# Patient Record
Sex: Female | Born: 1955
Health system: Southern US, Community
[De-identification: ages and names within clinical notes are randomized; demographics above are authoritative.]

## PROBLEM LIST (undated history)

## (undated) DIAGNOSIS — I10 Essential (primary) hypertension: Secondary | ICD-10-CM

## (undated) DIAGNOSIS — B1920 Unspecified viral hepatitis C without hepatic coma: Secondary | ICD-10-CM

## (undated) DIAGNOSIS — E119 Type 2 diabetes mellitus without complications: Secondary | ICD-10-CM

## (undated) DIAGNOSIS — I639 Cerebral infarction, unspecified: Secondary | ICD-10-CM

## (undated) DIAGNOSIS — J189 Pneumonia, unspecified organism: Secondary | ICD-10-CM

## (undated) HISTORY — PX: NECK SURGERY: SHX720

## (undated) HISTORY — PX: SHOULDER SURGERY: SHX246

---

## 1997-11-09 ENCOUNTER — Ambulatory Visit (HOSPITAL_BASED_OUTPATIENT_CLINIC_OR_DEPARTMENT_OTHER): Admission: RE | Admit: 1997-11-09 | Discharge: 1997-11-09 | Payer: Self-pay | Admitting: Orthopedic Surgery

## 1998-02-12 ENCOUNTER — Encounter: Payer: Self-pay | Admitting: Orthopedic Surgery

## 1998-02-12 ENCOUNTER — Ambulatory Visit (HOSPITAL_COMMUNITY): Admission: RE | Admit: 1998-02-12 | Discharge: 1998-02-12 | Payer: Self-pay | Admitting: Orthopedic Surgery

## 1998-02-22 ENCOUNTER — Ambulatory Visit (HOSPITAL_COMMUNITY): Admission: RE | Admit: 1998-02-22 | Discharge: 1998-02-22 | Payer: Self-pay | Admitting: Orthopedic Surgery

## 1998-02-22 ENCOUNTER — Encounter: Payer: Self-pay | Admitting: Orthopedic Surgery

## 1998-06-21 ENCOUNTER — Encounter: Payer: Self-pay | Admitting: Neurosurgery

## 1998-06-23 ENCOUNTER — Inpatient Hospital Stay (HOSPITAL_COMMUNITY): Admission: RE | Admit: 1998-06-23 | Discharge: 1998-06-25 | Payer: Self-pay | Admitting: Neurosurgery

## 1998-06-23 ENCOUNTER — Encounter: Payer: Self-pay | Admitting: Neurosurgery

## 1998-12-13 ENCOUNTER — Encounter: Admission: RE | Admit: 1998-12-13 | Discharge: 1998-12-13 | Payer: Self-pay | Admitting: Family Medicine

## 1999-01-24 ENCOUNTER — Encounter: Admission: RE | Admit: 1999-01-24 | Discharge: 1999-01-24 | Payer: Self-pay | Admitting: Family Medicine

## 1999-02-03 ENCOUNTER — Ambulatory Visit (HOSPITAL_COMMUNITY): Admission: RE | Admit: 1999-02-03 | Discharge: 1999-02-03 | Payer: Self-pay | Admitting: Orthopaedic Surgery

## 1999-02-03 ENCOUNTER — Encounter: Payer: Self-pay | Admitting: Orthopaedic Surgery

## 2002-02-20 ENCOUNTER — Inpatient Hospital Stay (HOSPITAL_COMMUNITY): Admission: AD | Admit: 2002-02-20 | Discharge: 2002-02-20 | Payer: Self-pay | Admitting: *Deleted

## 2002-02-23 ENCOUNTER — Inpatient Hospital Stay (HOSPITAL_COMMUNITY): Admission: AD | Admit: 2002-02-23 | Discharge: 2002-02-23 | Payer: Self-pay | Admitting: *Deleted

## 2003-03-13 ENCOUNTER — Encounter: Payer: Self-pay | Admitting: *Deleted

## 2003-03-13 ENCOUNTER — Emergency Department (HOSPITAL_COMMUNITY): Admission: AD | Admit: 2003-03-13 | Discharge: 2003-03-13 | Payer: Self-pay | Admitting: Family Medicine

## 2003-03-15 ENCOUNTER — Emergency Department (HOSPITAL_COMMUNITY): Admission: AD | Admit: 2003-03-15 | Discharge: 2003-03-15 | Payer: Self-pay | Admitting: Family Medicine

## 2005-01-17 ENCOUNTER — Emergency Department (HOSPITAL_COMMUNITY): Admission: EM | Admit: 2005-01-17 | Discharge: 2005-01-18 | Payer: Self-pay | Admitting: Emergency Medicine

## 2005-08-23 ENCOUNTER — Emergency Department (HOSPITAL_COMMUNITY): Admission: EM | Admit: 2005-08-23 | Discharge: 2005-08-23 | Payer: Self-pay | Admitting: Emergency Medicine

## 2006-06-23 ENCOUNTER — Emergency Department (HOSPITAL_COMMUNITY): Admission: EM | Admit: 2006-06-23 | Discharge: 2006-06-23 | Payer: Self-pay | Admitting: Emergency Medicine

## 2007-10-26 ENCOUNTER — Emergency Department (HOSPITAL_COMMUNITY): Admission: EM | Admit: 2007-10-26 | Discharge: 2007-10-26 | Payer: Self-pay | Admitting: Urology

## 2008-01-21 ENCOUNTER — Emergency Department (HOSPITAL_COMMUNITY): Admission: EM | Admit: 2008-01-21 | Discharge: 2008-01-21 | Payer: Self-pay | Admitting: Emergency Medicine

## 2008-08-12 ENCOUNTER — Inpatient Hospital Stay (HOSPITAL_COMMUNITY): Admission: EM | Admit: 2008-08-12 | Discharge: 2008-08-14 | Payer: Self-pay | Admitting: Emergency Medicine

## 2009-10-23 ENCOUNTER — Emergency Department (HOSPITAL_COMMUNITY): Admission: EM | Admit: 2009-10-23 | Discharge: 2009-10-23 | Payer: Self-pay | Admitting: Emergency Medicine

## 2010-04-22 ENCOUNTER — Encounter: Payer: Self-pay | Admitting: Sports Medicine

## 2010-07-10 LAB — URINALYSIS, ROUTINE W REFLEX MICROSCOPIC
Bilirubin Urine: NEGATIVE
Hgb urine dipstick: NEGATIVE
Ketones, ur: NEGATIVE mg/dL
Protein, ur: NEGATIVE mg/dL
Specific Gravity, Urine: 1.004 — ABNORMAL LOW (ref 1.005–1.030)
Urobilinogen, UA: 1 mg/dL (ref 0.0–1.0)

## 2010-07-10 LAB — RAPID URINE DRUG SCREEN, HOSP PERFORMED
Amphetamines: NOT DETECTED
Benzodiazepines: NOT DETECTED

## 2010-07-10 LAB — COMPREHENSIVE METABOLIC PANEL
AST: 100 U/L — ABNORMAL HIGH (ref 0–37)
Alkaline Phosphatase: 77 U/L (ref 39–117)
Calcium: 9.3 mg/dL (ref 8.4–10.5)
Total Bilirubin: 0.9 mg/dL (ref 0.3–1.2)
Total Protein: 6.7 g/dL (ref 6.0–8.3)

## 2010-07-10 LAB — BASIC METABOLIC PANEL
CO2: 29 mEq/L (ref 19–32)
Calcium: 8.8 mg/dL (ref 8.4–10.5)
Chloride: 109 mEq/L (ref 96–112)
Creatinine, Ser: 0.7 mg/dL (ref 0.4–1.2)
GFR calc Af Amer: >60 mL/min (ref >60)
GFR calc non Af Amer: >60 mL/min (ref >60)
Glucose, Bld: 122 mg/dL — ABNORMAL HIGH (ref 70–99)

## 2010-07-10 LAB — HEPATIC FUNCTION PANEL
Alkaline Phosphatase: 71 U/L (ref 39–117)
Indirect Bilirubin: 0.7 mg/dL (ref 0.3–0.9)
Total Protein: 6.6 g/dL (ref 6.0–8.3)

## 2010-07-10 LAB — CBC
HCT: 42.9 % (ref 36.0–46.0)
Platelets: 143 10*3/uL — ABNORMAL LOW (ref 150–400)
WBC: 7.4 10*3/uL (ref 4.0–10.5)

## 2010-07-10 LAB — POCT I-STAT, CHEM 8
Chloride: 102 mEq/L (ref 96–112)
Hemoglobin: 15.6 g/dL — ABNORMAL HIGH (ref 12.0–15.0)
Sodium: 138 mEq/L (ref 135–145)
TCO2: 28 mmol/L (ref 0–100)

## 2010-07-10 LAB — APTT: aPTT: 31 seconds (ref 24–37)

## 2010-07-10 LAB — URINE CULTURE

## 2010-07-10 LAB — CARDIAC PANEL(CRET KIN+CKTOT+MB+TROPI)
CK, MB: 0.6 ng/mL (ref 0.3–4.0)
Relative Index: INVALID (ref 0.0–2.5)
Total CK: 50 U/L (ref 7–177)
Total CK: 56 U/L (ref 7–177)
Troponin I: <0.01 ng/mL (ref 0.00–0.06)
Troponin I: <0.01 ng/mL (ref 0.00–0.06)

## 2010-07-10 LAB — POCT CARDIAC MARKERS
CKMB, poc: <1 ng/mL — ABNORMAL LOW (ref 1.0–8.0)
Myoglobin, poc: 49.6 ng/mL (ref 12–200)

## 2010-07-10 LAB — DIFFERENTIAL
Basophils Absolute: 0 10*3/uL (ref 0.0–0.1)
Basophils Relative: 0 % (ref 0–1)
Eosinophils Relative: 1 % (ref 0–5)
Lymphs Abs: 2.8 10*3/uL (ref 0.7–4.0)
Neutro Abs: 4 10*3/uL (ref 1.7–7.7)

## 2010-07-10 LAB — D-DIMER, QUANTITATIVE: D-Dimer, Quant: <0.22 ug/mL-FEU (ref 0.00–0.48)

## 2010-07-10 LAB — TSH: TSH: 0.328 u[IU]/mL — ABNORMAL LOW (ref 0.350–4.500)

## 2010-07-10 LAB — PROTIME-INR: Prothrombin Time: 13.5 seconds (ref 11.6–15.2)

## 2010-07-10 LAB — LIPID PANEL: VLDL: 11 mg/dL (ref 0–40)

## 2010-08-14 NOTE — Discharge Summary (Signed)
[name], [name]            ACCOUNT NO.:  [id number]   MEDICAL RECORD NO.:  [id number]          PATIENT TYPE:  INP   LOCATION:  1409                         FACILITY:  [facility]   PHYSICIAN:  Peggye Pitt, M.D. DATE OF BIRTH:  [birth year]   DATE OF ADMISSION:  08/12/2008  DATE OF DISCHARGE:  08/14/2008                               DISCHARGE SUMMARY   DISCHARGE DIAGNOSES:  1. Atypical chest pain, ruled out for acute coronary syndrome, likely      secondary to cocaine.  2. Cocaine abuse.  3. Alcohol abuse.  4. Hypertension.  5. Tobacco abuse.   DISCHARGE MEDICATIONS:  Include  1. Lopressor 12.5 mg twice daily.  2. Aspirin 81 mg daily.  3. Thiamine 100 mg daily.  4. Multivitamin 1 tablet daily.   DISPOSITION AND FOLLOW-UP:  [name] is discharged home today in  stable condition.  She has not experienced any chest pain since  admission.  She currently does not have a primary care physician.  She  has been provided with the number for [facility] for her to make an  appointment to follow up with.   CONSULTATION THIS HOSPITALIZATION:  None.   Images and procedures performed during this hospitalization include a  chest x-ray on Aug 12, 2008 that showed no acute cardiopulmonary  abnormality.   HISTORY AND PHYSICAL EXAM:  For full details please refer to dictation  by Dr. Orvan Falconer on Aug 12, 2008, but in brief [name] is a pleasant  55 year old African American woman with a history of polysubstance abuse  including cocaine who presented to the hospital with complaints of  central chest pain radiating to her right and left arms associated with  nausea and diaphoresis that she has been having for the past 3 weeks.  She usually uses cocaine about 3 times a week and she last used the day  prior to admission.  For this reason she was brought to the emergency  room at the insistence of her sister for further evaluation and  management.   HOSPITAL COURSE BY PROBLEM:  1.  Atypical chest pain.  She has now ruled out for an acute coronary      syndrome with 3 sets of negative cardiac enzymes.  She has also had      an EKG that shows no acute ST or T-wave changes.  I believe at this      time that her chest pain is most likely secondary to her cocaine      use.  If the chest pain recurs in the absence of cocaine at that      point, I would consider a stress test given that she does have some      coronary artery disease risk factors including obesity,      hypertension, and tobacco abuse.  2. Hypertension.  She has maintained good blood pressure while in the      hospital. It seems upon admission that she was on      hydrochlorothiazide at a very high dose of 50 mg.  However, because      of her chest pain  I have decided to start her on metoprolol      instead.  Hence I have discontinued her hydrochlorothiazide.  She      has maintained excellent blood pressure control while in the      hospital.  3. Polysubstance abuse including alcohol, cocaine and tobacco.  She      has been placed on an alcohol withdrawal protocol while in the      hospital. She was given a multivitamin and thiamine as well.  I      have provided her with aggressive smoking and cocaine cessation      counseling. However, she refuses assistance at this time.  Of note      her UDS was positive for cocaine upon admission.  4. Rest of chronic medical issues have not been a problem this      hospitalization.   VITAL SIGNS UPON DISCHARGE:  Blood pressure 113/76, heart rate 65,  respirations 20, O2 sats 96% on room air with a temperature of 98.   LABS ON DAY OF DISCHARGE:  Sodium 141, potassium 3.7, chloride 109,  bicarb 29, BUN 11, creatinine 0.7 with a glucose of 122.  WBCs 5.6,  hemoglobin 13.4, platelet count of 135. A TSH that is low normal at  0.35.  A fasting lipid panel that shows a total cholesterol of 123,  triglycerides of 56 and HDL of 39 and an LDL of 73.      Peggye Pitt, M.D.  Electronically Signed     EH/MEDQ  D:  08/14/2008  T:  08/14/2008  Job:  [id number]

## 2010-08-14 NOTE — H&P (Signed)
[name], [name] NO.:  [id number]   MEDICAL RECORD NO.:  [id number]          PATIENT TYPE:  INP   LOCATION:                               FACILITY:  [facility]   PHYSICIAN:  Vania Rea, M.D. DATE OF BIRTH:  04-Jan-1956   DATE OF ADMISSION:  08/12/2008  DATE OF DISCHARGE:                              HISTORY & PHYSICAL   PRIMARY CARE PHYSICIAN:  Unassigned.   CHIEF COMPLAINT:  Chest pain.   HISTORY OF PRESENT ILLNESS:  This is a 55 year old African American lady  with no past history of cardiac disease, but does have a 37-year history  of cocaine abuse, ongoing who reports that she has been having frequent  episodes of central chest pain radiating to the left arm associated with  nausea and diaphoresis for the past 3 weeks.  The patient says she  thinks it is precipitated by stress.  She has recently had to attend to  her sister who was dying of cancer as well as giving orders to have her  sister's life terminated.  She has also been dealing with the funeral  arrangements.  She says the pain is not aggravated by exertion, but she  does not exert herself very much.  She does not do much walking because  of osteoarthritis in the knees.  The pain seems to be aggravated by  sitting up.  It is not related to meals.  It is relieved by resting or  lying still.   The patient uses cocaine frequently, but is unable to quantify.  The  last use was yesterday.  She is very vague about her alcohol use  history.  Says she only drinks one beer occasionally and liquor very  occasionally.  She was last in rehab about 12 years ago and  last  abstained when she was incarcerated about 1 year ago.  She smokes 2  packs per day for about 37 years.  She denies history of cardiac disease  or hyperlipidemia.  She does have a history of hypertension, but has not  been seeing any physician.  Sometimes takes her sister's lisinopril,  self-medicates herself with HCTZ, but  cannot  remember how she gets it.   She denies any fever, cough or cold.  She denies any nausea or vomiting.  She is constipated.  She has been having lower abdominal pain and  episodic epigastric pain.   PAST MEDICAL HISTORY:  1. Hypertension.  2. Tobacco abuse.  3. Cocaine abuse.  4. Questionable alcohol abuse.  This is recorded in her medical      records, but she denies it.  5. History of bilateral shoulder surgery 12 years ago for dislocated      shoulder.  6. History of neck surgery 11 years ago for a prolapsed disk.   MEDICATIONS:  HCTZ 50 mg daily.   ALLERGIES:  IBUPROFEN causes hives.   SOCIAL HISTORY:  As noted above.   REVIEW OF SYSTEMS:  On a 10-point review of systems other than noted  above unremarkable.   PHYSICAL EXAMINATION:  GENERAL:  Middle-aged [place] American lady  reclining in  the stretcher.  At various times, she appears very drowsy.  Although cooperative with the history, is very vague and has a very  strange affect in giving the history.  VITAL SIGNS:  Temperature is 98.3, pulse 89, respirations 18, blood  pressure 109/74. She is saturating at 97% on room air.  She denies any  pain currently.  She has received intravenous morphine while in the  emergency room.  HEENT:  Her pupils are round, equal and reactive.  Mucous membranes are  pink and anicteric.  She is mildly dehydrated.  NECK:  No cervical lymphadenopathy or thyromegaly.  No carotid bruit.  CHEST:  Clear to auscultation bilaterally.  CARDIOVASCULAR:  Regular rhythm without murmur.  ABDOMEN:  Obese and soft.  She does have epigastric tenderness and right  lower quadrant tenderness, but there is no rebound.  There are no  masses.  EXTREMITIES:  Without edema.  She has 2+ pulses bilaterally.  CENTRAL NERVOUS SYSTEM:  Cranial nerves II-XII are grossly intact and  she has no focal neurologic deficit.   LABORATORY DATA:  CBC is remarkable only for platelet count 143, MCV of  103.6, hemoglobin is  14.5.  CBC is otherwise unremarkable.  Serum  chemistry is significant for serum potassium of 3.2, it is otherwise  unremarkable.  Her coags are normal with a PT of 13.5.  Urinalysis is  unremarkable.  Urine drug screen is pending.   DIAGNOSTICS:  1. Two-view chest x-ray shows no acute cardiopulmonary disease.  2. X-rays of the cervical spine shows anterior fusion of C4-C7,      anterior osteophytes of C2-C3 and C3-C4, but no evidence of acute      abnormality.   ASSESSMENT:  1. Atypical chest pain.  2. Polysubstance abuse.  3. History of hypertension.  4. Hypokalemia possibly related to HCTZ use.   PLAN:  We will admit this lady on a chest pain rule out protocol.  She  denies alcohol abuse and indicates very clearly that she is not  interested in tobacco or cocaine cessation.  If the patient's cardiac  enzymes are negative, she may benefit from cardiac stress testing.  Other plans as per orders.      Vania Rea, M.D.  Electronically Signed     LC/MEDQ  D:  08/12/2008  T:  08/13/2008  Job:  [id number]

## 2010-12-31 LAB — POCT I-STAT, CHEM 8
Calcium, Ion: 1.17
Chloride: 104
Glucose, Bld: 135 — ABNORMAL HIGH
HCT: 49 — ABNORMAL HIGH
TCO2: 25

## 2010-12-31 LAB — URINALYSIS, ROUTINE W REFLEX MICROSCOPIC
Bilirubin Urine: NEGATIVE
Ketones, ur: NEGATIVE
Nitrite: NEGATIVE
Protein, ur: NEGATIVE

## 2012-08-27 ENCOUNTER — Encounter (HOSPITAL_COMMUNITY): Payer: Self-pay | Admitting: Emergency Medicine

## 2012-08-27 ENCOUNTER — Emergency Department (INDEPENDENT_AMBULATORY_CARE_PROVIDER_SITE_OTHER)
Admission: EM | Admit: 2012-08-27 | Discharge: 2012-08-27 | Disposition: A | Discharge status: Home / Self Care | Payer: Self-pay | Source: Home / Self Care | Attending: Family Medicine | Admitting: Family Medicine

## 2012-08-27 DIAGNOSIS — J40 Bronchitis, not specified as acute or chronic: Secondary | ICD-10-CM

## 2012-08-27 HISTORY — DX: Unspecified viral hepatitis C without hepatic coma: B19.20

## 2012-08-27 HISTORY — DX: Pneumonia, unspecified organism: J18.9

## 2012-08-27 MED ORDER — CETIRIZINE HCL 10 MG PO TABS: 10.0000 mg | ORAL_TABLET | Freq: Every evening | ORAL | Status: DC | PRN

## 2012-08-27 MED ORDER — PREDNISONE 20 MG PO TABS: ORAL_TABLET | ORAL | Status: DC

## 2012-08-27 MED ORDER — AZITHROMYCIN 250 MG PO TABS: ORAL_TABLET | ORAL | Status: DC

## 2012-08-27 MED ORDER — ALBUTEROL SULFATE HFA 108 (90 BASE) MCG/ACT IN AERS: 1.0000 | INHALATION_SPRAY | Freq: Four times a day (QID) | RESPIRATORY_TRACT | Status: DC | PRN

## 2012-08-27 MED ORDER — GUAIFENESIN-CODEINE 100-10 MG/5ML PO SYRP: 5.0000 mL | ORAL_SOLUTION | Freq: Three times a day (TID) | ORAL | Status: DC | PRN

## 2012-08-27 NOTE — ED Provider Notes (Signed)
History     CSN: [id number]  Arrival date & time 08/27/12  1031   First MD Initiated Contact with Patient 08/27/12 1119      Chief Complaint  Patient presents with  . Nasal Congestion    (Consider location/radiation/quality/duration/timing/severity/associated sxs/prior treatment) HPI Comments: 57 year old smoker female with history of hepatitis C. Here complaining of  nasal congestion, bilateral ear pressure, productive cough of a yellow sputum and intermittent wheezing for 8 days. Has had temperatures of 98 and 99 Fahrenheit denies chills. No chest pain or abdominal pain. No nausea vomiting or diarrhea.   Past Medical History  Diagnosis Date  . Hepatitis C   . Pneumonia     Past Surgical History  Procedure Laterality Date  . Neck surgery    . Shoulder surgery Bilateral     No family history on file.  History  Substance Use Topics  . Smoking status: Current Every Day Smoker  . Smokeless tobacco: Not on file  . Alcohol Use: Yes    OB History   Grav Para Term Preterm Abortions TAB SAB Ect Mult Living                  Review of Systems  Constitutional: Negative for fever, chills, diaphoresis, appetite change and fatigue.  HENT: Positive for ear pain, congestion and rhinorrhea. Negative for hearing loss, sore throat, trouble swallowing, neck stiffness and ear discharge.   Respiratory: Positive for cough. Negative for shortness of breath and wheezing.   Cardiovascular: Negative for chest pain.  Gastrointestinal: Negative for vomiting, abdominal pain and diarrhea.  Skin: Negative for rash.  Neurological: Negative for dizziness and headaches.    Allergies  Ibuprofen  Home Medications   Current Outpatient Rx  Name  Route  Sig  Dispense  Refill  . albuterol (PROVENTIL HFA;VENTOLIN HFA) 108 (90 BASE) MCG/ACT inhaler   Inhalation   Inhale 1-2 puffs into the lungs every 6 (six) hours as needed for wheezing.   1 Inhaler   0   . azithromycin (ZITHROMAX) 250 MG  tablet      2 tabs by mouth on day one then one tablet daily for 4 more days.   6 tablet   0   . guaiFENesin-codeine (ROBITUSSIN AC) 100-10 MG/5ML syrup   Oral   Take 5 mLs by mouth 3 (three) times daily as needed for cough.   120 mL   0   . predniSONE (DELTASONE) 20 MG tablet      2 tabs by mouth daily for 5 days   10 tablet   0     BP 154/96  Pulse 66  Temp(Src) 98.1 F (36.7 C) (Oral)  Resp 20  SpO2 96%  Physical Exam  Nursing note and vitals reviewed. Constitutional: She is oriented to person, place, and time. She appears well-developed and well-nourished. No distress.  Nasal Congestion with erythema and swelling of nasal turbinates, clear rhinorrhea. pharyngeal erythema no exudates. No uvula deviation. No trismus. TM's impress retracted bilaterally no redness swelling or bulging  HENT:  Head: Normocephalic.  Right Ear: External ear normal.  Left Ear: External ear normal.  Eyes: Conjunctivae are normal. Right eye exhibits no discharge. Left eye exhibits no discharge.  Neck: Neck supple. No thyromegaly present.  Cardiovascular: Normal rate, regular rhythm and normal heart sounds.   Pulmonary/Chest: Effort normal and breath sounds normal. No respiratory distress. She has no wheezes. She has no rales. She exhibits no tenderness.  Bronchitic cough. Sporadic expiratory rhonchi bilaterally  Lymphadenopathy:    She has no cervical adenopathy.  Neurological: She is alert and oriented to person, place, and time.  Skin: No rash noted. She is not diaphoretic.    ED Course  Procedures (including critical care time)  Labs Reviewed - No data to display No results found.   1. Bronchitis       MDM  Prescribed albuterol, azithromycin, guaifenesin/codeine and prednisone. Supportive care and red flags that should prompt her return to medical attention discussed with patient and provided in writing.        Sharin Grave, MD 08/29/12 1610

## 2012-08-27 NOTE — ED Notes (Signed)
C/o fever, congestion, and cough.  Onset last week.

## 2012-08-27 NOTE — ED Notes (Signed)
Dr Alfonse Ras  Handed instructions to this nurse-patient not in treatment room  Patient was trying to catch ride with someone else in lobby, but did not tell this nurse she was leaving now

## 2012-09-21 ENCOUNTER — Emergency Department (HOSPITAL_COMMUNITY)
Admission: EM | Admit: 2012-09-21 | Discharge: 2012-09-21 | Disposition: A | Discharge status: Home / Self Care | Payer: Self-pay | Attending: Emergency Medicine | Admitting: Emergency Medicine

## 2012-09-21 ENCOUNTER — Emergency Department (HOSPITAL_COMMUNITY): Payer: Self-pay

## 2012-09-21 ENCOUNTER — Encounter (HOSPITAL_COMMUNITY): Payer: Self-pay | Admitting: Emergency Medicine

## 2012-09-21 DIAGNOSIS — Z888 Allergy status to other drugs, medicaments and biological substances status: Secondary | ICD-10-CM | POA: Insufficient documentation

## 2012-09-21 DIAGNOSIS — I10 Essential (primary) hypertension: Secondary | ICD-10-CM | POA: Insufficient documentation

## 2012-09-21 DIAGNOSIS — F17200 Nicotine dependence, unspecified, uncomplicated: Secondary | ICD-10-CM | POA: Insufficient documentation

## 2012-09-21 DIAGNOSIS — J410 Simple chronic bronchitis: Secondary | ICD-10-CM | POA: Insufficient documentation

## 2012-09-21 DIAGNOSIS — B1920 Unspecified viral hepatitis C without hepatic coma: Secondary | ICD-10-CM | POA: Insufficient documentation

## 2012-09-21 DIAGNOSIS — Z8701 Personal history of pneumonia (recurrent): Secondary | ICD-10-CM | POA: Insufficient documentation

## 2012-09-21 DIAGNOSIS — F1410 Cocaine abuse, uncomplicated: Secondary | ICD-10-CM | POA: Insufficient documentation

## 2012-09-21 DIAGNOSIS — R079 Chest pain, unspecified: Secondary | ICD-10-CM | POA: Insufficient documentation

## 2012-09-21 DIAGNOSIS — R110 Nausea: Secondary | ICD-10-CM | POA: Insufficient documentation

## 2012-09-21 DIAGNOSIS — R61 Generalized hyperhidrosis: Secondary | ICD-10-CM | POA: Insufficient documentation

## 2012-09-21 DIAGNOSIS — R509 Fever, unspecified: Secondary | ICD-10-CM | POA: Insufficient documentation

## 2012-09-21 DIAGNOSIS — R0602 Shortness of breath: Secondary | ICD-10-CM | POA: Insufficient documentation

## 2012-09-21 DIAGNOSIS — F1490 Cocaine use, unspecified, uncomplicated: Secondary | ICD-10-CM

## 2012-09-21 HISTORY — DX: Essential (primary) hypertension: I10

## 2012-09-21 LAB — CBC
HCT: 42.9 % (ref 36.0–46.0)
MCHC: 35.4 g/dL (ref 30.0–36.0)
MCV: 100.5 fL — ABNORMAL HIGH (ref 78.0–100.0)
Platelets: 103 10*3/uL — ABNORMAL LOW (ref 150–400)
RDW: 12.2 % (ref 11.5–15.5)
WBC: 5.3 10*3/uL (ref 4.0–10.5)

## 2012-09-21 LAB — BASIC METABOLIC PANEL
BUN: 9 mg/dL (ref 6–23)
Chloride: 102 mEq/L (ref 96–112)
Creatinine, Ser: 0.68 mg/dL (ref 0.50–1.10)
GFR calc Af Amer: >90 mL/min (ref >90)
GFR calc non Af Amer: >90 mL/min (ref >90)
Potassium: 3.6 mEq/L (ref 3.5–5.1)

## 2012-09-21 LAB — POCT I-STAT TROPONIN I: Troponin i, poc: 0.01 ng/mL (ref 0.00–0.08)

## 2012-09-21 MED ORDER — MORPHINE SULFATE 4 MG/ML IJ SOLN
4.0000 mg | Freq: Once | INTRAMUSCULAR | Status: AC
  Administered 2012-09-21: 4 mg via INTRAMUSCULAR
  Filled 2012-09-21: qty 1

## 2012-09-21 MED ORDER — DIPHENHYDRAMINE HCL 50 MG/ML IJ SOLN
25.0000 mg | Freq: Once | INTRAMUSCULAR | Status: AC
  Administered 2012-09-21: 25 mg via INTRAVENOUS
  Filled 2012-09-21: qty 1

## 2012-09-21 MED ORDER — IOHEXOL 350 MG/ML SOLN
100.0000 mL | Freq: Once | INTRAVENOUS | Status: AC | PRN
  Administered 2012-09-21: 80 mL via INTRAVENOUS

## 2012-09-21 MED ORDER — MORPHINE SULFATE 4 MG/ML IJ SOLN: 4.0000 mg | Freq: Once | INTRAMUSCULAR | Status: DC

## 2012-09-21 MED ORDER — ONDANSETRON HCL 4 MG/2ML IJ SOLN: 4.0000 mg | Freq: Once | INTRAMUSCULAR | Status: DC

## 2012-09-21 MED ORDER — ONDANSETRON 4 MG PO TBDP
4.0000 mg | ORAL_TABLET | Freq: Once | ORAL | Status: AC
  Administered 2012-09-21: 4 mg via ORAL
  Filled 2012-09-21: qty 1

## 2012-09-21 MED ORDER — ACETAMINOPHEN 500 MG PO TABS: 500.0000 mg | ORAL_TABLET | Freq: Four times a day (QID) | ORAL | Status: DC | PRN

## 2012-09-21 NOTE — ED Notes (Signed)
Patient oximetry dropped to 86% while ambulating

## 2012-09-21 NOTE — ED Notes (Signed)
Pt reports mouth swelling and redness after CT dye. PA made aware.

## 2012-09-21 NOTE — ED Provider Notes (Signed)
History    CSN: [id number] Arrival date & time 09/21/12  1404  First MD Initiated Contact with Patient 09/21/12 1559     Chief Complaint  Patient presents with  . Chest Pain   (Consider location/radiation/quality/duration/timing/severity/associated sxs/prior Treatment) HPI Pt is a 57yo female presenting with 3 day hx of midsternal chest pain that is constant, "pressure like," 5/10, radiating down left arm.  Pt also reports mild SOB, nausea, fever of 101 Saturday and diaphoresis "dripping wet" with chronic "smokers cough."  Pt has not tried anything for pain.  Admits to using cocaine 2 days ago but states she does not recall having chest pain in past with cocaine use.  Pt also reports hx of Hep C, "kidney problems" born with 3 kidneys, HTN, and admittance to hospital 4 years ago for hypokalemia.  Denies vomiting, recent travel, recent surgery, leg pain, or sick contacts.  Past Medical History  Diagnosis Date  . Hepatitis C   . Pneumonia   . Hypertension    Past Surgical History  Procedure Laterality Date  . Neck surgery    . Shoulder surgery Bilateral    History reviewed. No pertinent family history. History  Substance Use Topics  . Smoking status: Current Every Day Smoker  . Smokeless tobacco: Not on file  . Alcohol Use: Yes   OB History   Grav Para Term Preterm Abortions TAB SAB Ect Mult Living                 Review of Systems  Constitutional: Positive for fever and diaphoresis. Negative for chills and fatigue.  Respiratory: Positive for cough and shortness of breath.   Cardiovascular: Positive for chest pain. Negative for palpitations and leg swelling.  Gastrointestinal: Positive for nausea. Negative for vomiting, abdominal pain and diarrhea.  All other systems reviewed and are negative.    Allergies  Ibuprofen  Home Medications   Current Outpatient Rx  Name  Route  Sig  Dispense  Refill  . albuterol (PROVENTIL HFA;VENTOLIN HFA) 108 (90 BASE) MCG/ACT  inhaler   Inhalation   Inhale 2 puffs into the lungs every 6 (six) hours as needed for wheezing.         . cetirizine (ZYRTEC) 10 MG tablet   Oral   Take 10 mg by mouth at bedtime as needed for allergies or rhinitis.         . metoprolol tartrate (LOPRESSOR) 25 MG tablet   Oral   Take 25 mg by mouth 2 (two) times daily.         Marland Kitchen acetaminophen (TYLENOL) 500 MG tablet   Oral   Take 1 tablet (500 mg total) by mouth every 6 (six) hours as needed for pain.   30 tablet   0   . azithromycin (ZITHROMAX) 250 MG tablet   Oral   Take 250 mg by mouth daily. 2 tabs by mouth on day one then one tablet daily for 4 more days.         . predniSONE (DELTASONE) 20 MG tablet   Oral   Take 20 mg by mouth daily. 2 tabs by mouth daily for 5 days          BP 155/103  Pulse 64  Temp(Src) 97.7 F (36.5 C) (Oral)  Resp 13  SpO2 96% Physical Exam  Nursing note and vitals reviewed. Constitutional: She appears well-developed and well-nourished. No distress.  Pt lying comfortably in exam bed, no acute distress. Able to speak in full sentences.  HENT:  Head: Normocephalic and atraumatic.  Eyes: Conjunctivae are normal. No scleral icterus.  Neck: Normal range of motion. Neck supple.  Cardiovascular: Normal rate, regular rhythm and normal heart sounds.   Pulmonary/Chest: Effort normal and breath sounds normal. No respiratory distress. She has no wheezes. She has no rales. She exhibits no tenderness.  Abdominal: Soft. Bowel sounds are normal. She exhibits no distension and no mass. There is no tenderness. There is no rebound and no guarding.  Musculoskeletal: Normal range of motion.  Neurological: She is alert.  Skin: Skin is warm and dry. She is not diaphoretic.    ED Course  Procedures (including critical care time) Labs Reviewed  CBC - Abnormal; Notable for the following:    Hemoglobin 15.2 (*)    MCV 100.5 (*)    MCH 35.6 (*)    Platelets 103 (*)    All other components within  normal limits  BASIC METABOLIC PANEL - Abnormal; Notable for the following:    Glucose, Bld 153 (*)    All other components within normal limits  POCT I-STAT TROPONIN I   Dg Chest 2 View  09/21/2012   *RADIOLOGY REPORT*  Clinical Data: Chest pain.  History of hypertension, smoking, bronchitis.  CHEST - 2 VIEW  Comparison: 08/12/2008  Findings: Cardiomediastinal silhouette is within normal limits. The lungs are free of focal consolidations and pleural effusions. No pulmonary edema.  Previous anterior cervical spine fusion.  IMPRESSION: No evidence for acute cardiopulmonary abnormality.   Original Report Authenticated By: Norva Pavlov, M.D.   Ct Angio Chest Pe W/cm &/or Wo Cm  09/21/2012   *RADIOLOGY REPORT*  Clinical Data: Chest pain and short of breath.  Hypoxia.  CT ANGIOGRAPHY CHEST  Technique:  Multidetector CT imaging of the chest using the standard protocol during bolus administration of intravenous contrast. Multiplanar reconstructed images including MIPs were obtained and reviewed to evaluate the vascular anatomy.  Contrast: 80mL OMNIPAQUE IOHEXOL 350 MG/ML SOLN  Comparison: 09/21/2012  Findings: Negative for pulmonary embolism.  No acute abnormality of the aorta or heart.  Mild dependent atelectasis in the lung bases.  Negative for pneumonia.  No mass lesion or adenopathy is present in the chest. Upper abdomen is negative.  IMPRESSION: Negative for pulmonary embolism.  No acute abnormality.   Original Report Authenticated By: Janeece Riggers, M.D.    Date: 09/21/2012  Rate: 97  Rhythm: normal sinus rhythm  QRS Axis: normal  Intervals: normal  ST/T Wave abnormalities: nonspecific ST/T changes  Conduction Disutrbances:none  Narrative Interpretation:   Old EKG Reviewed: none available    1. Chest pain   2. Cocaine use     MDM  Pt with recent cocaine use presenting with 3 day hx of midsternal CP.  DDX: STEMI/ACS, PE (low risk, PERC neg), cocaine related CP, pneumonia, pericarditis,  GERD, MSK, endocarditis.    4:22 PM  Pt is hypertensive upon arrival at 155/103.  Will tx with morphine and zophran at this time.  Obtaining labs: CBC, BMP, istat troponin, and imaging: CXR and EKG.  Will reassess.   CBC: unremarkable BMP: unremarkable ECG: unremarkable, no STEMI istat troponin: neg CXR: no evidence of acute cardiopulmonary abnormality   4:22 PMPt states pain has improved.  Vitals are unremarkable except O2 stats of 93%.  Will ambulate pt.    4:22 PM Ambulatory O2: down to 86%.  Will skip d-dimer and go straight to CT angio.  Pt is PERC neg however increased concern for PE after negative cardiac workup,  pt is still symptomatic.    CT angio: Neg for PE. No acute abnormality 4:22 PM Repeat ambulatory O2 is 94%, will discharge pt home to f/u with PCP.    F/u with PCP, [place] [facility] info provided. Return precautions given. Pt verbalized understanding and agreement with tx plan. Vitals: unremarkable. Discharged in stable condition.    Discussed pt with Dr. Effie Shy during ED encounter.     Junius Finner, PA-C 09/22/12 1622

## 2012-09-21 NOTE — ED Notes (Signed)
Pt c/o mid sternal CP x 3 days; pt admits to cocaine use last night; pt sts some SOB and cough

## 2012-09-23 NOTE — ED Provider Notes (Signed)
Medical screening examination/treatment/procedure(s) were performed by non-physician practitioner and as supervising physician I was immediately available for consultation/collaboration.  Flint Melter, MD 09/23/12 [phone]

## 2013-02-02 ENCOUNTER — Ambulatory Visit: Payer: Medicaid Other | Attending: Internal Medicine

## 2013-03-02 ENCOUNTER — Ambulatory Visit: Payer: Self-pay | Admitting: Internal Medicine

## 2013-03-08 ENCOUNTER — Encounter (HOSPITAL_COMMUNITY): Payer: Self-pay | Admitting: Emergency Medicine

## 2013-03-08 ENCOUNTER — Emergency Department (HOSPITAL_COMMUNITY): Payer: No Typology Code available for payment source

## 2013-03-08 ENCOUNTER — Emergency Department (HOSPITAL_COMMUNITY)
Admission: EM | Admit: 2013-03-08 | Discharge: 2013-03-08 | Disposition: A | Discharge status: Home / Self Care | Payer: No Typology Code available for payment source | Attending: Emergency Medicine | Admitting: Emergency Medicine

## 2013-03-08 DIAGNOSIS — Y9289 Other specified places as the place of occurrence of the external cause: Secondary | ICD-10-CM | POA: Insufficient documentation

## 2013-03-08 DIAGNOSIS — M79671 Pain in right foot: Secondary | ICD-10-CM

## 2013-03-08 DIAGNOSIS — R42 Dizziness and giddiness: Secondary | ICD-10-CM | POA: Insufficient documentation

## 2013-03-08 DIAGNOSIS — Y939 Activity, unspecified: Secondary | ICD-10-CM | POA: Insufficient documentation

## 2013-03-08 DIAGNOSIS — Z8701 Personal history of pneumonia (recurrent): Secondary | ICD-10-CM | POA: Insufficient documentation

## 2013-03-08 DIAGNOSIS — F17200 Nicotine dependence, unspecified, uncomplicated: Secondary | ICD-10-CM | POA: Insufficient documentation

## 2013-03-08 DIAGNOSIS — R142 Eructation: Secondary | ICD-10-CM | POA: Insufficient documentation

## 2013-03-08 DIAGNOSIS — I10 Essential (primary) hypertension: Secondary | ICD-10-CM | POA: Insufficient documentation

## 2013-03-08 DIAGNOSIS — R269 Unspecified abnormalities of gait and mobility: Secondary | ICD-10-CM | POA: Insufficient documentation

## 2013-03-08 DIAGNOSIS — R141 Gas pain: Secondary | ICD-10-CM | POA: Insufficient documentation

## 2013-03-08 DIAGNOSIS — Z79899 Other long term (current) drug therapy: Secondary | ICD-10-CM | POA: Insufficient documentation

## 2013-03-08 DIAGNOSIS — S8990XA Unspecified injury of unspecified lower leg, initial encounter: Secondary | ICD-10-CM | POA: Insufficient documentation

## 2013-03-08 DIAGNOSIS — Z8619 Personal history of other infectious and parasitic diseases: Secondary | ICD-10-CM | POA: Insufficient documentation

## 2013-03-08 DIAGNOSIS — W108XXA Fall (on) (from) other stairs and steps, initial encounter: Secondary | ICD-10-CM | POA: Insufficient documentation

## 2013-03-08 MED ORDER — MORPHINE SULFATE 2 MG/ML IJ SOLN
2.0000 mg | Freq: Once | INTRAMUSCULAR | Status: AC
Start: 2013-03-08 — End: 2013-03-08
  Administered 2013-03-08: 2 mg via INTRAVENOUS
  Filled 2013-03-08: qty 1

## 2013-03-08 MED ORDER — HYDROCODONE-ACETAMINOPHEN 5-325 MG PO TABS: 1.0000 | ORAL_TABLET | Freq: Four times a day (QID) | ORAL | Status: DC | PRN

## 2013-03-08 MED ORDER — ONDANSETRON HCL 4 MG/2ML IJ SOLN
4.0000 mg | Freq: Once | INTRAMUSCULAR | Status: AC
  Administered 2013-03-08: 4 mg via INTRAVENOUS
  Filled 2013-03-08: qty 2

## 2013-03-08 NOTE — ED Provider Notes (Signed)
CSN: [id number]     Arrival date & time 03/08/13  1100 History   First MD Initiated Contact with Patient 03/08/13 1125     Chief Complaint  Patient presents with  . Foot Pain    bilateral   HPI 57 year old woman with PMH HTN, hepatitis C who presents with bilateral foot pain.   Patient states she went to a music performance last night, drank 2 alcoholic beverages. She went to a neighbor's house after and fell down the stairs on her way home.  She did not hit her head or lose consciousness.  However, she came down wrong on her feet and has been in excruciating pain in bilateral feet since. States she went to sleep last night but awoke this morning crying in pain. She is unable to walk or put any weight on her feet and states that the tops of both feet are very tender to touch.  She notes one scrape on the top of her left foot but no bruising.  Patient also thinks that her blood pressure has been high for the past week or so and as a result she has felt dizzy intermittently.  She already has an appointment set up in [facility] later this month.   On ROS, reports headache this morning. No chest pain, shortness of breath, abdominal pain (she does note some distention after her drinking last night), n/v/d.      Past Medical History  Diagnosis Date  . Hepatitis C   . Pneumonia   . Hypertension    Past Surgical History  Procedure Laterality Date  . Neck surgery    . Shoulder surgery Bilateral    No family history on file. History  Substance Use Topics  . Smoking status: Current Every Day Smoker  . Smokeless tobacco: Not on file  . Alcohol Use: Yes   OB History   Grav Para Term Preterm Abortions TAB SAB Ect Mult Living                 Review of Systems  Constitutional: Negative for fever and chills.  HENT: Negative for congestion.   Respiratory: Negative for cough and shortness of breath.   Cardiovascular: Negative for chest pain, palpitations and leg swelling.  Gastrointestinal:  Positive for abdominal distention. Negative for nausea, vomiting, abdominal pain, diarrhea, constipation and blood in stool.  Genitourinary: Negative for dysuria.  Musculoskeletal: Positive for gait problem and joint swelling. Negative for back pain.  Neurological: Positive for dizziness. Negative for syncope and light-headedness.    Allergies  Ibuprofen  Home Medications   Current Outpatient Rx  Name  Route  Sig  Dispense  Refill  . albuterol (PROVENTIL HFA;VENTOLIN HFA) 108 (90 BASE) MCG/ACT inhaler   Inhalation   Inhale 2 puffs into the lungs every 6 (six) hours as needed for wheezing.         Marland Kitchen FLUoxetine (PROZAC) 20 MG capsule   Oral   Take 20 mg by mouth daily.         . metoprolol tartrate (LOPRESSOR) 25 MG tablet   Oral   Take 12.5 mg by mouth 2 (two) times daily.          Marland Kitchen zolpidem (AMBIEN) 5 MG tablet   Oral   Take 5 mg by mouth at bedtime as needed for sleep.         Marland Kitchen HYDROcodone-acetaminophen (NORCO) 5-325 MG per tablet   Oral   Take 1 tablet by mouth every 6 (six)  hours as needed for moderate pain.   10 tablet   0    BP 121/81  Pulse 78  Temp(Src) 98.2 F (36.8 C) (Oral)  Resp 20  SpO2 96% Physical Exam  Constitutional: She is oriented to person, place, and time. She appears well-developed and well-nourished. No distress.  HENT:  Head: Normocephalic and atraumatic.  Eyes: Conjunctivae and EOM are normal. Pupils are equal, round, and reactive to light.  Neck: Normal range of motion. Neck supple.  Cardiovascular: Normal rate, regular rhythm and normal heart sounds.   Pulmonary/Chest: Effort normal and breath sounds normal.  Abdominal: Soft. Bowel sounds are normal. She exhibits distension. There is no tenderness. There is no rebound and no guarding.  Musculoskeletal:  Neurovascularly intact.   Mild abrasion to lateral dorsal surface of left foot.  Tenderness to very light palpation of dorsal surfaces of bilateral feet.  No ecchymoses, no  erythema.   Neurological: She is alert and oriented to person, place, and time. No cranial nerve deficit.  Skin: Skin is warm and dry.    ED Course  Procedures (including critical care time) Labs Review Labs Reviewed - No data to display Imaging Review Dg Foot 2 Views Left  03/08/2013   CLINICAL DATA:  Foot pain  EXAM: LEFT FOOT - 2 VIEW  COMPARISON:  None.  FINDINGS: There is no evidence of fracture or dislocation. There is no evidence of arthropathy or other focal bone abnormality. Soft tissues are unremarkable.  IMPRESSION: Negative.   Electronically Signed   By: Malachy Moan M.D.   On: 03/08/2013 12:34   Dg Foot 2 Views Right  03/08/2013   CLINICAL DATA:  Larey Seat, pain on bottom of feet  EXAM: RIGHT FOOT - 2 VIEW  COMPARISON:  Concurrently obtained radiographs of the contralateral foot  FINDINGS: There is no evidence of fracture or dislocation. There is no evidence of arthropathy or other focal bone abnormality. Soft tissues are unremarkable. Incidental note is made of then os tibialis externum.  IMPRESSION: Negative.   Electronically Signed   By: Malachy Moan M.D.   On: 03/08/2013 12:37    EKG Interpretation   None       MDM  Bilateral foot pain after fall- tenderness to touch of dorsal aspects of feet bilaterally. Bilateral 2 view foot xrays negative.  Morphine 2 mg IV, Zofran given for pain.  She has an appt in [facility] later this month.  Discharged home with rx for Norco 5-325 mg PO prn pain #10.  She was encouraged to follow-up with PCP.   Rocco Serene, MD 03/08/13 [phone]

## 2013-03-08 NOTE — ED Notes (Signed)
Patient states bilateral foot pain after falling down stairs last night, patient with abrasion to top of left foot.  Patient also with pain in right thumb, +PMS in all extremities

## 2013-03-10 NOTE — ED Provider Notes (Signed)
I saw and evaluated the patient, reviewed the resident's note and I agree with the findings and plan.  EKG Interpretation   None       Xrays negative. Bilat foot pain.  Normal ankle exam.  VSS.  Pt cannot take NSAIDs. Plan for RICE and short course of pain Rx.  ER precautions given.    Darlys Gales, MD 03/10/13 0630

## 2013-03-30 ENCOUNTER — Ambulatory Visit: Payer: No Typology Code available for payment source | Attending: Internal Medicine | Admitting: Internal Medicine

## 2013-03-30 ENCOUNTER — Encounter: Payer: Self-pay | Admitting: Internal Medicine

## 2013-03-30 VITALS — BP 160/105 | HR 78 | Temp 98.9°F | Resp 14 | Ht 65.0 in | Wt 206.0 lb

## 2013-03-30 DIAGNOSIS — B1920 Unspecified viral hepatitis C without hepatic coma: Secondary | ICD-10-CM | POA: Insufficient documentation

## 2013-03-30 DIAGNOSIS — I10 Essential (primary) hypertension: Secondary | ICD-10-CM | POA: Insufficient documentation

## 2013-03-30 DIAGNOSIS — E119 Type 2 diabetes mellitus without complications: Secondary | ICD-10-CM

## 2013-03-30 DIAGNOSIS — F3289 Other specified depressive episodes: Secondary | ICD-10-CM | POA: Insufficient documentation

## 2013-03-30 DIAGNOSIS — F329 Major depressive disorder, single episode, unspecified: Secondary | ICD-10-CM | POA: Insufficient documentation

## 2013-03-30 DIAGNOSIS — M79609 Pain in unspecified limb: Secondary | ICD-10-CM | POA: Insufficient documentation

## 2013-03-30 LAB — CBC WITH DIFFERENTIAL/PLATELET
Basophils Relative: 0 % (ref 0–1)
Eosinophils Absolute: 0.1 10*3/uL (ref 0.0–0.7)
Lymphs Abs: 3 10*3/uL (ref 0.7–4.0)
MCH: 35.4 pg — ABNORMAL HIGH (ref 26.0–34.0)
MCHC: 35.5 g/dL (ref 30.0–36.0)
Monocytes Relative: 7 % (ref 3–12)
Neutro Abs: 2.3 10*3/uL (ref 1.7–7.7)
Neutrophils Relative %: 40 % — ABNORMAL LOW (ref 43–77)
Platelets: 132 10*3/uL — ABNORMAL LOW (ref 150–400)
RBC: 4.27 MIL/uL (ref 3.87–5.11)

## 2013-03-30 LAB — LIPID PANEL
Cholesterol: 114 mg/dL (ref 0–200)
HDL: 39 mg/dL — ABNORMAL LOW (ref >39)
LDL Cholesterol: 58 mg/dL (ref 0–99)
Total CHOL/HDL Ratio: 2.9 Ratio
Triglycerides: 86 mg/dL (ref <150)
VLDL: 17 mg/dL (ref 0–40)

## 2013-03-30 LAB — COMPLETE METABOLIC PANEL WITH GFR
ALT: 113 U/L — ABNORMAL HIGH (ref 0–35)
Albumin: 3.8 g/dL (ref 3.5–5.2)
Alkaline Phosphatase: 83 U/L (ref 39–117)
BUN: 11 mg/dL (ref 6–23)
CO2: 27 mEq/L (ref 19–32)
GFR, Est African American: >89 mL/min
GFR, Est Non African American: >89 mL/min
Sodium: 137 mEq/L (ref 135–145)
Total Bilirubin: 0.9 mg/dL (ref 0.3–1.2)
Total Protein: 6.9 g/dL (ref 6.0–8.3)

## 2013-03-30 LAB — POCT GLYCOSYLATED HEMOGLOBIN (HGB A1C): Hemoglobin A1C: 5

## 2013-03-30 MED ORDER — FLUOXETINE HCL 20 MG PO CAPS: 20.0000 mg | ORAL_CAPSULE | Freq: Every day | ORAL | Status: DC

## 2013-03-30 MED ORDER — METOPROLOL TARTRATE 25 MG PO TABS: 12.5000 mg | ORAL_TABLET | Freq: Two times a day (BID) | ORAL | Status: DC

## 2013-03-30 MED ORDER — OFLOXACIN 0.3 % OP SOLN: 1.0000 [drp] | Freq: Four times a day (QID) | OPHTHALMIC | Status: DC

## 2013-03-30 MED ORDER — TERBINAFINE HCL 1 % EX CREA: 1.0000 "application " | TOPICAL_CREAM | Freq: Two times a day (BID) | CUTANEOUS | Status: DC

## 2013-03-30 MED ORDER — NICOTINE 14 MG/24HR TD PT24: 14.0000 mg | MEDICATED_PATCH | Freq: Every day | TRANSDERMAL | Status: DC

## 2013-03-30 MED ORDER — TRAMADOL HCL 50 MG PO TABS
50.0000 mg | ORAL_TABLET | Freq: Three times a day (TID) | ORAL | Status: DC | PRN
Start: 2013-03-30 — End: 2013-06-01

## 2013-03-30 MED ORDER — VITAMIN B-1 100 MG PO TABS: 100.0000 mg | ORAL_TABLET | Freq: Every day | ORAL | Status: DC

## 2013-03-30 NOTE — Progress Notes (Signed)
Patient ID: [name], female   DOB: 05-Sep-1955, 57 y.o.   MRN: [id number]   CC:  HPI 57 year old female with multiple medical complaints. The patient primarily complains of left foot pain. She had a fall and has had difficulty weightbearing. She also complains of having history of hypertension and was on metoprolol and lisinopril. She has not been taking these medications for several months. She also complains of having history of hepatitis C. She complains of drinking excessively  At least 4-5 beer a day. She states that she developed hepatitis C because of IV drug use She also complains of bruising easily She has a rash on her back at that she has been told was a fungal infection but that has improved since   She denies any hematochezia She states that her sister had breast cancer. She personally had a mammogram done last year which was negative  She states that she is up-to-date with her hepatitis immunization She does not want the flu vaccine  She has a family history of diabetes in her father    Allergies  Allergen Reactions  . Ibuprofen Hives and Other (See Comments)    Burning of the skin.   Past Medical History  Diagnosis Date  . Hepatitis C   . Pneumonia   . Hypertension    Current Outpatient Prescriptions on File Prior to Visit  Medication Sig Dispense Refill  . albuterol (PROVENTIL HFA;VENTOLIN HFA) 108 (90 BASE) MCG/ACT inhaler Inhale 2 puffs into the lungs every 6 (six) hours as needed for wheezing.      Marland Kitchen zolpidem (AMBIEN) 5 MG tablet Take 5 mg by mouth at bedtime as needed for sleep.      Marland Kitchen HYDROcodone-acetaminophen (NORCO) 5-325 MG per tablet Take 1 tablet by mouth every 6 (six) hours as needed for moderate pain.  10 tablet  0   No current facility-administered medications on file prior to visit.   Family History  Problem Relation Age of Onset  . Diabetes Father   . Diabetes Sister    History   Social History  . Marital Status: Single    Spouse  Name: N/A    Number of Children: N/A  . Years of Education: N/A   Occupational History  . Not on file.   Social History Main Topics  . Smoking status: Current Every Day Smoker  . Smokeless tobacco: Not on file  . Alcohol Use: Yes  . Drug Use: Yes    Special: Cocaine  . Sexual Activity: Not on file   Other Topics Concern  . Not on file   Social History Narrative  . No narrative on file    Review of Systems  Constitutional: Negative for fever, chills, diaphoresis, activity change, appetite change and fatigue.  HENT: Negative for ear pain, nosebleeds, congestion, facial swelling, rhinorrhea, neck pain, neck stiffness and ear discharge.   Eyes: Negative for pain, discharge, redness, itching and visual disturbance.  Respiratory: Negative for cough, choking, chest tightness, shortness of breath, wheezing and stridor.   Cardiovascular: Negative for chest pain, palpitations and leg swelling.  Gastrointestinal: Negative for abdominal distention.  Genitourinary: Negative for dysuria, urgency, frequency, hematuria, flank pain, decreased urine volume, difficulty urinating and dyspareunia.  Musculoskeletal: Negative for back pain, joint swelling, arthralgias and gait problem.  Neurological: Negative for dizziness, tremors, seizures, syncope, facial asymmetry, speech difficulty, weakness, light-headedness, numbness and headaches.  Hematological: Negative for adenopathy. Does not bruise/bleed easily.  Psychiatric/Behavioral: Negative for hallucinations, behavioral problems, confusion, dysphoric mood,  decreased concentration and agitation.    Objective:   Filed Vitals:   03/30/13 1551  BP: 160/105  Pulse: 78  Temp: 98.9 F (37.2 C)  Resp: 14    Physical Exam  Constitutional: Appears well-developed and well-nourished. No distress.  HENT: Normocephalic. External right and left ear normal. Oropharynx is clear and moist.  Eyes: Conjunctivae and EOM are normal. PERRLA, no scleral  icterus.  Neck: Normal ROM. Neck supple. No JVD. No tracheal deviation. No thyromegaly.  CVS: RRR, S1/S2 +, no murmurs, no gallops, no carotid bruit.  Pulmonary: Effort and breath sounds normal, no stridor, rhonchi, wheezes, rales.  Abdominal: Soft. BS +,  no distension, tenderness, rebound or guarding.  Musculoskeletal: Normal range of motion. No edema and no tenderness.  Lymphadenopathy: No lymphadenopathy noted, cervical, inguinal. Neuro: Alert. Normal reflexes, muscle tone coordination. No cranial nerve deficit. Skin: Skin is warm and dry. No rash noted. Not diaphoretic. No erythema. No pallor.  Psychiatric: Normal mood and affect. Behavior, judgment, thought content normal.   Lab Results  Component Value Date   WBC 5.3 09/21/2012   HGB 15.2* 09/21/2012   HCT 42.9 09/21/2012   MCV 100.5* 09/21/2012   PLT 103* 09/21/2012   Lab Results  Component Value Date   CREATININE 0.68 09/21/2012   BUN 9 09/21/2012   NA 137 09/21/2012   K 3.6 09/21/2012   CL 102 09/21/2012   CO2 27 09/21/2012    No results found for this basename: HGBA1C   Lipid Panel     Component Value Date/Time   CHOL  Value: 123        ATP III CLASSIFICATION:  <200     mg/dL   Desirable  536-644  mg/dL   Borderline High  >=034    mg/dL   High        7/42/5956 0309   TRIG 56 08/13/2008 0309   HDL 39* 08/13/2008 0309   CHOLHDL 3.2 08/13/2008 0309   VLDL 11 08/13/2008 0309   LDLCALC  Value: 73        Total Cholesterol/HDL:CHD Risk Coronary Heart Disease Risk Table                     Men   Women  1/2 Average Risk   3.4   3.3  Average Risk       5.0   4.4  2 X Average Risk   9.6   7.1  3 X Average Risk  23.4   11.0        Use the calculated Patient Ratio above and the CHD Risk Table to determine the patient's CHD Risk.        ATP III CLASSIFICATION (LDL):  <100     mg/dL   Optimal  387-564  mg/dL   Near or Above                    Optimal  130-159  mg/dL   Borderline  332-951  mg/dL   High  >884     mg/dL   Very High 1/66/0630 1601        Assessment and plan:   There are no active problems to display for this patient.  Hepatitis C We'll check CMP, CBC, and show the patient is not thrombocytopenic coagulopathic, HCV RNA   alcohol dependence Advised to quit drinking, start the patient on thiamine  Smoking cessation counseling was done and the patient has been provided with a nicotine patch  Hypertension start the patient on metoprolol  Depression Prozac refills, Denies any suicidal homicidal ideation  Onychomycosis Prescribe Lamisil Cream   left foot pain patient described tramadol, will obtain plain films  Followup in one month         The patient was given clear instructions to go to ER or return to medical center if symptoms don't improve, worsen or new problems develop. The patient verbalized understanding. The patient was told to call to get any lab results if not heard anything in the next week.

## 2013-03-30 NOTE — Addendum Note (Signed)
Addended by: Susie Cassette MD, Germain Osgood on: 03/30/2013 04:37 PM   Modules accepted: Orders

## 2013-03-30 NOTE — Addendum Note (Signed)
Addended by: Susie Cassette MD, Germain Osgood on: 03/30/2013 04:36 PM   Modules accepted: Orders

## 2013-03-30 NOTE — Progress Notes (Signed)
Pt is here to establish care. Complains of pain in Lt foot, bilateral hands, burning sensation in toes, Lt thumb locks up x5 months. No pain medication. Rash on buttocks x1 year; comes and goes. Rt knee pain; knee goes out and comes back. Joint pains, yellowish dark brown urine and yellowish stool. Could be blood in stool, excessive urine, excessive thirst, blurred vision, lightheadedness and dizziness. Pt has Hep C. Black toe nails.

## 2013-03-30 NOTE — Addendum Note (Signed)
Addended by: Lestine Mount on: 03/30/2013 04:35 PM   Modules accepted: Orders

## 2013-03-31 ENCOUNTER — Telehealth: Payer: Self-pay | Admitting: *Deleted

## 2013-03-31 LAB — VITAMIN D 25 HYDROXY (VIT D DEFICIENCY, FRACTURES): Vit D, 25-Hydroxy: 16 ng/mL — ABNORMAL LOW (ref 30–89)

## 2013-03-31 LAB — HCV RNA QUANT RFLX ULTRA OR GENOTYP: HCV Quantitative Log: 4.93 {Log} — ABNORMAL HIGH (ref <1.18)

## 2013-03-31 MED ORDER — VITAMIN D (ERGOCALCIFEROL) 1.25 MG (50000 UNIT) PO CAPS: 50000.0000 [IU] | ORAL_CAPSULE | ORAL | Status: DC

## 2013-03-31 NOTE — Telephone Encounter (Signed)
Message copied by Trinidi Toppins, [place] R on Wed Mar 31, 2013 10:06 AM ------      Message from: Susie Cassette MD, [facility]      Created: Wed Mar 31, 2013  9:50 AM       Please notify patient platelet count is low at 132. Liver function is mildly abnormal but this is not new for the patient. She has been referred to gastroenterology already. Vitamin D. level is low at 16. Please call in a prescription for vitamin D 50,000 units weekly, 10 tablets with 2 refills ------

## 2013-04-02 LAB — HEPATITIS C GENOTYPE

## 2013-05-03 ENCOUNTER — Ambulatory Visit: Payer: No Typology Code available for payment source | Attending: Internal Medicine | Admitting: Internal Medicine

## 2013-05-03 ENCOUNTER — Encounter: Payer: Self-pay | Admitting: Internal Medicine

## 2013-05-03 VITALS — BP 140/94 | HR 65 | Temp 98.0°F | Resp 16 | Ht 65.0 in | Wt 202.0 lb

## 2013-05-03 DIAGNOSIS — B1710 Acute hepatitis C without hepatic coma: Secondary | ICD-10-CM | POA: Insufficient documentation

## 2013-05-03 DIAGNOSIS — M129 Arthropathy, unspecified: Secondary | ICD-10-CM

## 2013-05-03 DIAGNOSIS — J111 Influenza due to unidentified influenza virus with other respiratory manifestations: Secondary | ICD-10-CM | POA: Insufficient documentation

## 2013-05-03 DIAGNOSIS — B1920 Unspecified viral hepatitis C without hepatic coma: Secondary | ICD-10-CM

## 2013-05-03 DIAGNOSIS — M1990 Unspecified osteoarthritis, unspecified site: Secondary | ICD-10-CM

## 2013-05-03 DIAGNOSIS — M1710 Unilateral primary osteoarthritis, unspecified knee: Secondary | ICD-10-CM | POA: Insufficient documentation

## 2013-05-03 DIAGNOSIS — IMO0002 Reserved for concepts with insufficient information to code with codable children: Secondary | ICD-10-CM

## 2013-05-03 MED ORDER — ACETAMINOPHEN-CODEINE #3 300-30 MG PO TABS
1.0000 | ORAL_TABLET | ORAL | Status: DC | PRN
Start: 2013-05-03 — End: 2013-08-21

## 2013-05-03 MED ORDER — GUAIFENESIN-DM 100-10 MG/5ML PO SYRP: 5.0000 mL | ORAL_SOLUTION | ORAL | Status: DC | PRN

## 2013-05-03 MED ORDER — OSELTAMIVIR PHOSPHATE 75 MG PO CAPS: 75.0000 mg | ORAL_CAPSULE | Freq: Two times a day (BID) | ORAL | Status: DC

## 2013-05-03 NOTE — Progress Notes (Signed)
Patient ID: [name], female   DOB: 08/01/1955, 58 y.o.   MRN: [id number] Patient Demographics  [name], is a 58 y.o. female  VHQ:[id number]  UXL:[id number]  DOB - 1955/05/28  No chief complaint on file.       Subjective:   [name] is a 58 y.o. female here today for a follow up visit. Patient is known to have hypertension and hepatitis C infection from previous IV drug use, major complaints today his cough productive of yellowish sputum, runny nose and general body aches. Her 74 year old daughter had influenza virus infection 3 days ago and since yesterday she is having the same symptoms as her. She denies fever, no headache. She has a chronic left knee pain that is acting up lately with worsening pain. No reinjury or recent trauma. No swelling of any joints. She also wants a referral to a specialist for her hepatitis C infection, she says she has been reading about available treatment and she is willing to be treated. Patient is not diabetic hemoglobin A1c was 5.0 recently Patient has No headache, No chest pain, No abdominal pain - No Nausea, No new weakness tingling or numbness, No Cough - SOB.  ALLERGIES: Allergies  Allergen Reactions  . Ibuprofen Hives and Other (See Comments)    Burning of the skin.    PAST MEDICAL HISTORY: Past Medical History  Diagnosis Date  . Hepatitis C   . Pneumonia   . Hypertension     MEDICATIONS AT HOME: Prior to Admission medications   Medication Sig Start Date End Date Taking? Authorizing Provider  albuterol (PROVENTIL HFA;VENTOLIN HFA) 108 (90 BASE) MCG/ACT inhaler Inhale 2 puffs into the lungs every 6 (six) hours as needed for wheezing. 08/27/12  Yes Adlih Moreno-Coll, MD  FLUoxetine (PROZAC) 20 MG capsule Take 1 capsule (20 mg total) by mouth daily. 03/30/13  Yes Reyne Dumas, MD  metoprolol tartrate (LOPRESSOR) 25 MG tablet Take 0.5 tablets (12.5 mg total) by mouth 2 (two) times daily. 03/30/13  Yes Reyne Dumas, MD   ofloxacin (OCUFLOX) 0.3 % ophthalmic solution Place 1 drop into both eyes 4 (four) times daily. 03/30/13  Yes Reyne Dumas, MD  terbinafine (LAMISIL AT) 1 % cream Apply 1 application topically 2 (two) times daily. 03/30/13  Yes Reyne Dumas, MD  traMADol (ULTRAM) 50 MG tablet Take 1 tablet (50 mg total) by mouth every 8 (eight) hours as needed. 03/30/13  Yes Reyne Dumas, MD  Vitamin D, Ergocalciferol, (DRISDOL) 50000 UNITS CAPS capsule Take 1 capsule (50,000 Units total) by mouth every 7 (seven) days. 03/31/13  Yes Reyne Dumas, MD  zolpidem (AMBIEN) 5 MG tablet Take 5 mg by mouth at bedtime as needed for sleep.   Yes Historical Provider, MD  acetaminophen-codeine (TYLENOL #3) 300-30 MG per tablet Take 1 tablet by mouth every 4 (four) hours as needed. 05/03/13   Angelica Chessman, MD  guaiFENesin-dextromethorphan (ROBITUSSIN DM) 100-10 MG/5ML syrup Take 5 mLs by mouth every 4 (four) hours as needed for cough. 05/03/13   Angelica Chessman, MD  HYDROcodone-acetaminophen (NORCO) 5-325 MG per tablet Take 1 tablet by mouth every 6 (six) hours as needed for moderate pain. 03/08/13   Ivin Poot, MD  nicotine (EQ NICOTINE) 14 mg/24hr patch Place 1 patch (14 mg total) onto the skin daily. 03/30/13   Reyne Dumas, MD  oseltamivir (TAMIFLU) 75 MG capsule Take 1 capsule (75 mg total) by mouth 2 (two) times daily. 05/03/13   Angelica Chessman, MD  thiamine (VITAMIN B-1) 100 MG  tablet Take 1 tablet (100 mg total) by mouth daily. 03/30/13   Reyne Dumas, MD     Objective:   Filed Vitals:   05/03/13 1649  BP: 140/94  Pulse: 65  Temp: 98 F (36.7 C)  TempSrc: Oral  Resp: 16  Height: 5\' 5"  (1.651 m)  Weight: 202 lb (91.627 kg)  SpO2: 95%    Exam General appearance : Awake, alert, not in any distress. Speech Clear. Not toxic looking HEENT: Atraumatic and Normocephalic, pupils equally reactive to light and accomodation Neck: supple, no JVD. No cervical lymphadenopathy.  Chest:Good air entry bilaterally,  no added sounds  CVS: S1 S2 regular, no murmurs.  Abdomen: Bowel sounds present, Non tender and not distended with no gaurding, rigidity or rebound. Extremities: B/L Lower Ext shows no edema, both legs are warm to touch Neurology: Awake alert, and oriented X 3, CN II-XII intact, Non focal Skin:No Rash Wounds:N/A   Data Review   CBC No results found for this basename: WBC, HGB, HCT, PLT, MCV, MCH, MCHC, RDW, NEUTRABS, LYMPHSABS, MONOABS, EOSABS, BASOSABS, BANDABS, BANDSABD,  in the last 168 hours  Chemistries   No results found for this basename: NA, K, CL, CO2, GLUCOSE, BUN, CREATININE, GFRCGP, CALCIUM, MG, AST, ALT, ALKPHOS, BILITOT,  in the last 168 hours ------------------------------------------------------------------------------------------------------------------ No results found for this basename: HGBA1C,  in the last 72 hours ------------------------------------------------------------------------------------------------------------------ No results found for this basename: CHOL, HDL, LDLCALC, TRIG, CHOLHDL, LDLDIRECT,  in the last 72 hours ------------------------------------------------------------------------------------------------------------------ No results found for this basename: TSH, T4TOTAL, FREET3, T3FREE, THYROIDAB,  in the last 72 hours ------------------------------------------------------------------------------------------------------------------ No results found for this basename: VITAMINB12, FOLATE, FERRITIN, TIBC, IRON, RETICCTPCT,  in the last 72 hours  Coagulation profile  No results found for this basename: INR, PROTIME,  in the last 168 hours    Assessment & Plan   1. Arthritis of the left knee  - acetaminophen-codeine (TYLENOL #3) 300-30 MG per tablet; Take 1 tablet by mouth every 4 (four) hours as needed.  Dispense: 60 tablet; Refill: 0  2. Influenza with respiratory manifestations  - oseltamivir (TAMIFLU) 75 MG capsule; Take 1 capsule (75 mg  total) by mouth 2 (two) times daily.  Dispense: 10 capsule; Refill: 0 - guaiFENesin-dextromethorphan (ROBITUSSIN DM) 100-10 MG/5ML syrup; Take 5 mLs by mouth every 4 (four) hours as needed for cough.  Dispense: 118 mL; Refill: 0  3. Hepatitis C infection  - Ambulatory referral to Infectious Disease  Patient was extensively counseled about smoking cessation Patient was counseled on nutrition and exercise  Follow up in 3 months or when necessary  The patient was given clear instructions to go to ER or return to medical center if symptoms don't improve, worsen or new problems develop. The patient verbalized understanding. The patient was told to call to get lab results if they haven't heard anything in the next week.    Angelica Chessman, MD, [place], [place], [place] [facility] [place], [place]   05/03/2013, 5:21 PM

## 2013-05-03 NOTE — Progress Notes (Signed)
Pt is here following up on her chronic knee pain. Pt is coughing up mucus frequently. Pt reports that she is having dry skin around her hands.

## 2013-05-03 NOTE — Patient Instructions (Signed)

## 2013-05-11 ENCOUNTER — Other Ambulatory Visit: Payer: Self-pay | Admitting: Internal Medicine

## 2013-05-11 MED ORDER — ALBUTEROL SULFATE HFA 108 (90 BASE) MCG/ACT IN AERS: 2.0000 | INHALATION_SPRAY | Freq: Four times a day (QID) | RESPIRATORY_TRACT | Status: DC | PRN

## 2013-06-01 ENCOUNTER — Encounter: Payer: Self-pay | Admitting: Internal Medicine

## 2013-06-01 ENCOUNTER — Ambulatory Visit (INDEPENDENT_AMBULATORY_CARE_PROVIDER_SITE_OTHER): Payer: No Typology Code available for payment source | Admitting: Internal Medicine

## 2013-06-01 VITALS — BP 149/92 | HR 68 | Temp 98.1°F | Ht 65.5 in | Wt 200.0 lb

## 2013-06-01 DIAGNOSIS — B1710 Acute hepatitis C without hepatic coma: Secondary | ICD-10-CM

## 2013-06-01 NOTE — Progress Notes (Signed)
Patient ID: [name], female   DOB: Jan 27, 1956, 58 y.o.   MRN: [id number] +[name] is a 58 y.o. female who presents for initial evaluation and management of a positive Hepatitis C antibody test.  Patient tested positive one year ago. Test was performed as part of an evaluation of known exposure to hepatitis C. Hepatitis C risk factors present are: IV drug abuse (details: shared needles with previous partner). Patient denies IV drug abuse. Patient has had other studies performed. Results: hepatitis C RNA by PCR, result: positive. Patient has not had prior treatment for Hepatitis C. Patient does not have a past history of liver disease. Patient does not have a family history of liver disease.   HPI:   Patient does have documented immunity to Hepatitis A. Patient does have documented immunity to Hepatitis B.     Review of Systems A comprehensive review of systems was negative.   Past Medical History  Diagnosis Date  . Hepatitis C   . Pneumonia   . Hypertension     History  Substance Use Topics  . Smoking status: Current Every Day Smoker -- 2.00 packs/day for 40 years    Types: Cigarettes  . Smokeless tobacco: Never Used  . Alcohol Use: 27.6 oz/week    42 Cans of beer, 4 Shots of liquor per week     Comment: "drinks all day long"    Family History  Problem Relation Age of Onset  . Diabetes Father   . Diabetes Sister       Objective:   Filed Vitals:   06/01/13 0953  BP: 149/92  Pulse: 68  Temp: 98.1 F (36.7 C)   in no apparent distress HEENT: anicteric Cor RRR clear Bowel sounds are normal, liver is not enlarged, spleen is not enlarged peripheral pulses normal, no pedal edema, no clubbing or cyanosis negative for - jaundice, spider hemangioma, telangiectasia, palmar erythema, ecchymosis and atrophy  Laboratory Genotype:  Lab Results  Component Value Date   HCVGENOTYPE 1a 03/30/2013   HCV viral load:  Lab Results  Component Value Date   WBC  5.7 03/30/2013   HGB 15.1* 03/30/2013   HCT 42.5 03/30/2013   MCV 99.5 03/30/2013   PLT 132* 03/30/2013    Lab Results  Component Value Date   CREATININE 0.74 03/30/2013   BUN 11 03/30/2013   NA 137 03/30/2013   K 3.8 03/30/2013   CL 106 03/30/2013   CO2 27 03/30/2013    Lab Results  Component Value Date   ALT 113* 03/30/2013   AST 86* 03/30/2013   ALKPHOS 83 03/30/2013   BILITOT 0.9 03/30/2013   INR 1.0 08/12/2008    HCV RNA 60630  Assessment: Hepatitis C genotype 1a  Plan: 1) Patient counseled extensively on limiting acetaminophen to no more than 2 grams daily, avoidance of alcohol.  She will need follow up with our substance abuse counselor. 2) Transmission discussed with patient including sexual transmission, sharing razors and toothbrush.   3) Will need referral to gastroenterology: has referral 4) Will need referral for substance abuse counseling: Y No current coverage at this time and not a candidate for treatment with ongoing substance abuse.  Needs counseling and to be drug and alcohol free for 6 months.  Will try to get medicaid in the meantime and will call back to schedule when she has that.

## 2013-06-09 ENCOUNTER — Ambulatory Visit: Payer: No Typology Code available for payment source | Admitting: *Deleted

## 2013-06-23 ENCOUNTER — Ambulatory Visit: Payer: No Typology Code available for payment source | Attending: Internal Medicine | Admitting: Internal Medicine

## 2013-06-23 MED ORDER — PREDNISONE 20 MG PO TABS: 40.0000 mg | ORAL_TABLET | Freq: Every day | ORAL | Status: DC

## 2013-06-23 MED ORDER — HYDROXYZINE HCL 10 MG PO TABS: 10.0000 mg | ORAL_TABLET | Freq: Three times a day (TID) | ORAL | Status: DC | PRN

## 2013-06-24 NOTE — Progress Notes (Signed)
Patient ID: [name], female   DOB: [birth year], 58 y.o.   MRN: [id number]   CC:  HPI: Patient seen and evaluated for a maculopapular rash and itching which is noted for about a couple weeks. The rash is present on her trunk back bilateral upper extremities and both legs. According to the patient she recently started taking Prozac and vitamin D. She's not sure if the onset of the rash correlates with her initiating these treatments. She denies any oral ulcerations.  Allergies  Allergen Reactions  . Ibuprofen Hives and Other (See Comments)    Burning of the skin.   Past Medical History  Diagnosis Date  . Hepatitis C   . Pneumonia   . Hypertension    Current Outpatient Prescriptions on File Prior to Visit  Medication Sig Dispense Refill  . acetaminophen-codeine (TYLENOL #3) 300-30 MG per tablet Take 1 tablet by mouth every 4 (four) hours as needed.  60 tablet  0  . albuterol (PROVENTIL HFA;VENTOLIN HFA) 108 (90 BASE) MCG/ACT inhaler Inhale 2 puffs into the lungs every 6 (six) hours as needed for wheezing.  3 Inhaler  3  . FLUoxetine (PROZAC) 20 MG capsule Take 1 capsule (20 mg total) by mouth daily.  30 capsule  3  . metoprolol tartrate (LOPRESSOR) 25 MG tablet Take 0.5 tablets (12.5 mg total) by mouth 2 (two) times daily.  60 tablet  5  . nicotine (EQ NICOTINE) 14 mg/24hr patch Place 1 patch (14 mg total) onto the skin daily.  28 patch  0  . ofloxacin (OCUFLOX) 0.3 % ophthalmic solution Place 1 drop into both eyes 4 (four) times daily.  10 mL  2  . terbinafine (LAMISIL AT) 1 % cream Apply 1 application topically 2 (two) times daily.  30 g  3  . thiamine (VITAMIN B-1) 100 MG tablet Take 1 tablet (100 mg total) by mouth daily.  30 tablet  3  . Vitamin D, Ergocalciferol, (DRISDOL) 50000 UNITS CAPS capsule Take 1 capsule (50,000 Units total) by mouth every 7 (seven) days.  10 capsule  2  . zolpidem (AMBIEN) 5 MG tablet Take 5 mg by mouth at bedtime as needed for sleep.       No  current facility-administered medications on file prior to visit.   Family History  Problem Relation Age of Onset  . Diabetes Father   . Diabetes Sister    History   Social History  . Marital Status: Single    Spouse Name: N/A    Number of Children: N/A  . Years of Education: N/A   Occupational History  . Not on file.   Social History Main Topics  . Smoking status: Current Every Day Smoker -- 2.00 packs/day for 40 years    Types: Cigarettes  . Smokeless tobacco: Never Used  . Alcohol Use: 27.6 oz/week    42 Cans of beer, 4 Shots of liquor per week     Comment: "drinks all day long"  . Drug Use: Yes    Special: Cocaine  . Sexual Activity: Not Currently   Other Topics Concern  . Not on file   Social History Narrative  . No narrative on file    Review of Systems  Constitutional: Negative for fever, chills, diaphoresis, activity change, appetite change and fatigue.  HENT: Negative for ear pain, nosebleeds, congestion, facial swelling, rhinorrhea, neck pain, neck stiffness and ear discharge.   Eyes: Negative for pain, discharge, redness, itching and visual disturbance.  Respiratory: Negative  for cough, choking, chest tightness, shortness of breath, wheezing and stridor.   Cardiovascular: Negative for chest pain, palpitations and leg swelling.  Gastrointestinal: Negative for abdominal distention.  Genitourinary: Negative for dysuria, urgency, frequency, hematuria, flank pain, decreased urine volume, difficulty urinating and dyspareunia.  Musculoskeletal: Negative for back pain, joint swelling, arthralgias and gait problem.  Neurological: Negative for dizziness, tremors, seizures, syncope, facial asymmetry, speech difficulty, weakness, light-headedness, numbness and headaches.  Hematological: Negative for adenopathy. Does not bruise/bleed easily.  Psychiatric/Behavioral: Negative for hallucinations, behavioral problems, confusion, dysphoric mood, decreased concentration and  agitation.    Objective:  There were no vitals filed for this visit.  Physical Exam  Constitutional: Appears well-developed and well-nourished. No distress.  HENT: Normocephalic. External right and left ear normal. Oropharynx is clear and moist.  Eyes: Conjunctivae and EOM are normal. PERRLA, no scleral icterus.  Neck: Normal ROM. Neck supple. No JVD. No tracheal deviation. No thyromegaly.  CVS: RRR, S1/S2 +, no murmurs, no gallops, no carotid bruit.  Pulmonary: Effort and breath sounds normal, no stridor, rhonchi, wheezes, rales.  Abdominal: Soft. BS +,  no distension, tenderness, rebound or guarding.  Musculoskeletal: Normal range of motion. No edema and no tenderness.  Lymphadenopathy: No lymphadenopathy noted, cervical, inguinal. Neuro: Alert. Normal reflexes, muscle tone coordination. No cranial nerve deficit. Skin: Diffuse maculopapular rash  Psychiatric: Normal mood and affect. Behavior, judgment, thought content normal.   Lab Results  Component Value Date   WBC 5.7 03/30/2013   HGB 15.1* 03/30/2013   HCT 42.5 03/30/2013   MCV 99.5 03/30/2013   PLT 132* 03/30/2013   Lab Results  Component Value Date   CREATININE 0.74 03/30/2013   BUN 11 03/30/2013   NA 137 03/30/2013   K 3.8 03/30/2013   CL 106 03/30/2013   CO2 27 03/30/2013    Lab Results  Component Value Date   HGBA1C 5.0 03/30/2013   Lipid Panel     Component Value Date/Time   CHOL 114 03/30/2013 1624   TRIG 86 03/30/2013 1624   HDL 39* 03/30/2013 1624   CHOLHDL 2.9 03/30/2013 1624   VLDL 17 03/30/2013 1624   LDLCALC 58 03/30/2013 1624       Assessment and plan:   Patient Active Problem List   Diagnosis Date Noted  . Arthritis 05/03/2013  . Influenza with respiratory manifestations 05/03/2013  . Acute hepatitis C virus infection without hepatic coma 05/03/2013   Diffuse maculopapular rash Patient prescribed prednisone for suspected drug rash Also started on hydroxyzine for itching If  no  improvement of the rash recurs, patient will be referred to dermatology     Followup as needed  The patient was given clear instructions to go to ER or return to medical center if symptoms don't improve, worsen or new problems develop. The patient verbalized understanding. The patient was told to call to get any lab results if not heard anything in the next week.

## 2013-08-03 ENCOUNTER — Ambulatory Visit: Payer: No Typology Code available for payment source | Attending: Internal Medicine | Admitting: Internal Medicine

## 2013-08-03 ENCOUNTER — Encounter: Payer: Self-pay | Admitting: Internal Medicine

## 2013-08-03 VITALS — BP 145/98 | HR 81 | Temp 98.3°F | Resp 16 | Ht 66.0 in | Wt 186.0 lb

## 2013-08-03 DIAGNOSIS — Z79899 Other long term (current) drug therapy: Secondary | ICD-10-CM | POA: Insufficient documentation

## 2013-08-03 DIAGNOSIS — B182 Chronic viral hepatitis C: Secondary | ICD-10-CM | POA: Insufficient documentation

## 2013-08-03 DIAGNOSIS — R112 Nausea with vomiting, unspecified: Secondary | ICD-10-CM | POA: Insufficient documentation

## 2013-08-03 DIAGNOSIS — R1013 Epigastric pain: Secondary | ICD-10-CM | POA: Insufficient documentation

## 2013-08-03 DIAGNOSIS — R634 Abnormal weight loss: Secondary | ICD-10-CM | POA: Insufficient documentation

## 2013-08-03 DIAGNOSIS — F329 Major depressive disorder, single episode, unspecified: Secondary | ICD-10-CM | POA: Insufficient documentation

## 2013-08-03 DIAGNOSIS — F17200 Nicotine dependence, unspecified, uncomplicated: Secondary | ICD-10-CM | POA: Insufficient documentation

## 2013-08-03 DIAGNOSIS — B1920 Unspecified viral hepatitis C without hepatic coma: Secondary | ICD-10-CM

## 2013-08-03 DIAGNOSIS — I10 Essential (primary) hypertension: Secondary | ICD-10-CM | POA: Insufficient documentation

## 2013-08-03 DIAGNOSIS — F3289 Other specified depressive episodes: Secondary | ICD-10-CM | POA: Insufficient documentation

## 2013-08-03 DIAGNOSIS — F1010 Alcohol abuse, uncomplicated: Secondary | ICD-10-CM | POA: Insufficient documentation

## 2013-08-03 DIAGNOSIS — Z8701 Personal history of pneumonia (recurrent): Secondary | ICD-10-CM | POA: Insufficient documentation

## 2013-08-03 LAB — LIPASE: Lipase: 74 U/L (ref 0–75)

## 2013-08-03 LAB — AMYLASE: Amylase: 40 U/L (ref 0–105)

## 2013-08-03 MED ORDER — ONDANSETRON 4 MG PO TBDP: 4.0000 mg | ORAL_TABLET | Freq: Three times a day (TID) | ORAL | Status: DC | PRN

## 2013-08-03 NOTE — Progress Notes (Signed)
Pt is here today because she is experiencing dizziness., lightheadedness, nausea and vomiting w/ weight loss on and off for a month. Pt states that she is sweating a lot. Pt reports having pain in her abdomen and her lower back.

## 2013-08-03 NOTE — Patient Instructions (Signed)
Chemical Dependency °Chemical dependency is an addiction to drugs or alcohol. It is characterized by the repeated behavior of seeking out and using drugs and alcohol despite harmful consequences to the health and safety of ones self and others.  °RISK FACTORS °There are certain situations or behaviors that increase a person's risk for chemical dependency. These include: °· A family history of chemical dependency. °· A history of mental health issues, including depression and anxiety. °· A home environment where drugs and alcohol are easily available to you. °· Drug or alcohol use at a young age. °SYMPTOMS  °The following symptoms can indicate chemical dependency: °· Inability to limit the use of drugs or alcohol. °· Nausea, sweating, shakiness, and anxiety that occurs when alcohol or drugs are not being used. °· An increase in amount of drugs or alcohol that is necessary to get drunk or high. °People who experience these symptoms can assess their use of drugs and alcohol by asking themselves the following questions: °· Have you been told by friends or family that they are worried about your use of alcohol or drugs? °· Do friends and family ever tell you about things you did while drinking alcohol or using drugs that you do not remember? °· Do you lie about using alcohol or drugs or about the amounts you use? °· Do you have difficulty completing daily tasks unless you use alcohol or drugs? °· Is the level of your work or school performance lower because of your drug or alcohol use? °· Do you get sick from using drugs or alcohol but keep using anyway? °· Do you feel uncomfortable in social situations unless you use alcohol or drugs? °· Do you use drugs or alcohol to help forget problems?  °An answer of yes to any of these questions may indicate chemical dependency. Professional evaluation is suggested. °Document Released: 03/12/2001 Document Revised: 06/10/2011 Document Reviewed: 05/24/2010 °[employer]® Patient  Information ©2014 [employer]. ° °

## 2013-08-03 NOTE — Progress Notes (Signed)
Patient ID: [name], female   DOB: 1956/01/27, 58 y.o.   MRN: [id number]   Basha Krygier, is a 58 y.o. female  QMV:[id number]  MWU:[id number]  DOB - 08/09/1955  Chief Complaint  Patient presents with  . Follow-up        Subjective:   [name] is a 58 y.o. female here today for a follow up visit. Patient is known to have hypertension, depression and hepatitis C infection from previous IV drug use, her medications include metoprolol for hypertension and fluoxetine for depression. She has been seen for hepatitis C evaluation and treatment by infectious disease specialist who concluded that: "No current coverage at this time and not a candidate for treatment with ongoing substance abuse. Needs counseling and to be drug and alcohol free for 6 months". Patient continues to abuse alcohol, drinks about 4-5 beers per day. Her major complaint today is abdominal pain mostly in the epigastrium, associated with nausea and vomiting, radiating to the back. Her last drink was yesterday. She has no history of delirium tremens, no history of withdrawal seizures. There is no abdominal distention. There is no change in bowel habit. Says she started vomiting she also has some dizziness and lightheadedness. She thinks she may be losing weight, not exactly sure how much she has lost the present time. She continue to smoke heavily about 2 packs of cigarette per day.  Patient has No headache, No chest pain, No new weakness tingling or numbness, No Cough - SOB.  Problem  Hepatitis C Infection  Nausea With Vomiting  Loss of Weight    ALLERGIES: Allergies  Allergen Reactions  . Ibuprofen Hives and Other (See Comments)    Burning of the skin.    PAST MEDICAL HISTORY: Past Medical History  Diagnosis Date  . Hepatitis C   . Pneumonia   . Hypertension     MEDICATIONS AT HOME: Prior to Admission medications   Medication Sig Start Date End Date Taking? Authorizing Provider  albuterol  (PROVENTIL HFA;VENTOLIN HFA) 108 (90 BASE) MCG/ACT inhaler Inhale 2 puffs into the lungs every 6 (six) hours as needed for wheezing. 05/11/13  Yes Angelica Chessman, MD  FLUoxetine (PROZAC) 20 MG capsule Take 1 capsule (20 mg total) by mouth daily. 03/30/13  Yes Reyne Dumas, MD  ofloxacin (OCUFLOX) 0.3 % ophthalmic solution Place 1 drop into both eyes 4 (four) times daily. 03/30/13  Yes Reyne Dumas, MD  terbinafine (LAMISIL AT) 1 % cream Apply 1 application topically 2 (two) times daily. 03/30/13  Yes Reyne Dumas, MD  thiamine (VITAMIN B-1) 100 MG tablet Take 1 tablet (100 mg total) by mouth daily. 03/30/13  Yes Reyne Dumas, MD  zolpidem (AMBIEN) 5 MG tablet Take 5 mg by mouth at bedtime as needed for sleep.   Yes Historical Provider, MD  acetaminophen-codeine (TYLENOL #3) 300-30 MG per tablet Take 1 tablet by mouth every 4 (four) hours as needed. 05/03/13   Angelica Chessman, MD  hydrOXYzine (ATARAX/VISTARIL) 10 MG tablet Take 1-2 tablets (10-20 mg total) by mouth every 8 (eight) hours as needed for itching. 06/23/13   Reyne Dumas, MD  metoprolol tartrate (LOPRESSOR) 25 MG tablet Take 0.5 tablets (12.5 mg total) by mouth 2 (two) times daily. 03/30/13   Reyne Dumas, MD  nicotine (EQ NICOTINE) 14 mg/24hr patch Place 1 patch (14 mg total) onto the skin daily. 03/30/13   Reyne Dumas, MD  ondansetron (ZOFRAN ODT) 4 MG disintegrating tablet Take 1 tablet (4 mg total) by mouth every 8 (  eight) hours as needed for nausea or vomiting. 08/03/13   Angelica Chessman, MD  predniSONE (DELTASONE) 20 MG tablet Take 2 tablets (40 mg total) by mouth daily with breakfast. 06/23/13   Reyne Dumas, MD  Vitamin D, Ergocalciferol, (DRISDOL) 50000 UNITS CAPS capsule Take 1 capsule (50,000 Units total) by mouth every 7 (seven) days. 03/31/13   Reyne Dumas, MD     Objective:   Filed Vitals:   08/03/13 1109  BP: 145/98  Pulse: 81  Temp: 98.3 F (36.8 C)  TempSrc: Oral  Resp: 16  Height: 5\' 6"  (1.676 m)  Weight: 186  lb (84.369 kg)  SpO2: 97%    Exam General appearance : Awake, alert, not in any distress. Speech Clear. Not toxic looking HEENT: Atraumatic and Normocephalic, pupils equally reactive to light and accomodation Neck: supple, no JVD. No cervical lymphadenopathy.  Chest:Good air entry bilaterally, no added sounds  CVS: S1 S2 regular, no murmurs.  Abdomen: Epigastric tenderness++, Bowel sounds present, not distended with no gaurding, rigidity or rebound. Extremities: B/L Lower Ext shows no edema, both legs are warm to touch Neurology: Awake alert, and oriented X 3, CN II-XII intact, Non focal Skin:No Rash Wounds:N/A  Data Review Lab Results  Component Value Date   HGBA1C 5.0 03/30/2013     Assessment & Plan   1. Hepatitis C infection "No current coverage at this time and not a candidate for treatment with ongoing substance abuse. Needs counseling and to be drug and alcohol free for 6 months. Will try to get medicaid in the meantime and will call back to schedule when she has that."   2. Nausea with vomiting with epigastric tenderness, most likely gastritis versus pancreatitis based on alcohol abuse  - ondansetron (ZOFRAN ODT) 4 MG disintegrating tablet; Take 1 tablet (4 mg total) by mouth every 8 (eight) hours as needed for nausea or vomiting.  Dispense: 30 tablet; Refill: 0 - CT Abdomen Pelvis W Contrast; Future - Amylase - Lipase  3. Loss of weight  - CT Abdomen Pelvis W Contrast; Future  Continue other medications for hypertension and depression Patient was extensively counseled about nutrition and exercise Patient was extensively counseled on smoking cessation and reduction in alcohol use as well as Tylenol use based on history of hepatitis C infection  Return in about 6 months (around 02/03/2014), or if symptoms worsen or fail to improve, for Abdominal Pain.  The patient was given clear instructions to go to ER or return to medical center if symptoms don't improve, worsen  or new problems develop. The patient verbalized understanding. The patient was told to call to get lab results if they haven't heard anything in the next week.   This note has been created with Surveyor, quantity. Any transcriptional errors are unintentional.    Angelica Chessman, MD, [place], [place], [place] and [facility] [place], [place]   08/03/2013, 11:42 AM

## 2013-08-04 ENCOUNTER — Ambulatory Visit (HOSPITAL_COMMUNITY)
Admission: RE | Admit: 2013-08-04 | Discharge: 2013-08-04 | Disposition: A | Discharge status: Home / Self Care | Payer: Medicaid Other | Source: Ambulatory Visit | Attending: Internal Medicine | Admitting: Internal Medicine

## 2013-08-04 DIAGNOSIS — R1031 Right lower quadrant pain: Secondary | ICD-10-CM | POA: Diagnosis not present

## 2013-08-04 DIAGNOSIS — R112 Nausea with vomiting, unspecified: Secondary | ICD-10-CM

## 2013-08-04 DIAGNOSIS — R1110 Vomiting, unspecified: Secondary | ICD-10-CM | POA: Insufficient documentation

## 2013-08-04 DIAGNOSIS — R634 Abnormal weight loss: Secondary | ICD-10-CM

## 2013-08-04 MED ORDER — IOHEXOL 300 MG/ML  SOLN
100.0000 mL | Freq: Once | INTRAMUSCULAR | Status: AC | PRN
  Administered 2013-08-04: 100 mL via INTRAVENOUS

## 2013-08-05 ENCOUNTER — Telehealth: Payer: Self-pay | Admitting: *Deleted

## 2013-08-05 ENCOUNTER — Telehealth: Payer: Self-pay | Admitting: Internal Medicine

## 2013-08-05 NOTE — Telephone Encounter (Signed)
Pt given CT results which require further MRI testing and uterine fibroids. Scheduled appt made on 08/17/2013 @ [facility] 1245 pm MRI radiology Pt informed she needs to be NPO 6 hrs prior to testing

## 2013-08-05 NOTE — Telephone Encounter (Signed)
Message copied by Joan Mayans on Thu Aug 05, 2013 11:06 AM ------      Message from: Angelica Chessman E      Created: Wed Aug 04, 2013  4:21 PM       Please inform patient that her CT abdomen and pelvis shows a lesion in the right kidney which needs further testing with MRI. It also shows that she has uterine fibroid and benign adrenal nodules. ------

## 2013-08-05 NOTE — Telephone Encounter (Signed)
Right renal lesion with slightly thicken walls peripheral calcification; renal failure carcinoma coul

## 2013-08-17 ENCOUNTER — Other Ambulatory Visit: Payer: Self-pay | Admitting: Internal Medicine

## 2013-08-17 ENCOUNTER — Ambulatory Visit (HOSPITAL_COMMUNITY)
Admission: RE | Admit: 2013-08-17 | Discharge: 2013-08-17 | Disposition: A | Discharge status: Home / Self Care | Payer: Medicaid Other | Source: Ambulatory Visit | Attending: Internal Medicine | Admitting: Internal Medicine

## 2013-08-17 DIAGNOSIS — R634 Abnormal weight loss: Secondary | ICD-10-CM

## 2013-08-17 DIAGNOSIS — N289 Disorder of kidney and ureter, unspecified: Secondary | ICD-10-CM

## 2013-08-17 DIAGNOSIS — K8020 Calculus of gallbladder without cholecystitis without obstruction: Secondary | ICD-10-CM | POA: Insufficient documentation

## 2013-08-17 DIAGNOSIS — N281 Cyst of kidney, acquired: Secondary | ICD-10-CM | POA: Insufficient documentation

## 2013-08-17 DIAGNOSIS — D3500 Benign neoplasm of unspecified adrenal gland: Secondary | ICD-10-CM | POA: Insufficient documentation

## 2013-08-17 LAB — POCT I-STAT CREATININE: Creatinine, Ser: 0.9 mg/dL (ref 0.50–1.10)

## 2013-08-17 MED ORDER — GADOBENATE DIMEGLUMINE 529 MG/ML IV SOLN
20.0000 mL | Freq: Once | INTRAVENOUS | Status: AC | PRN
  Administered 2013-08-17: 17 mL via INTRAVENOUS

## 2013-08-18 ENCOUNTER — Emergency Department (HOSPITAL_COMMUNITY): Payer: Medicaid Other

## 2013-08-18 ENCOUNTER — Inpatient Hospital Stay (HOSPITAL_COMMUNITY)
Admission: EM | Admit: 2013-08-18 | Discharge: 2013-08-26 | DRG: 023 | Disposition: A | Discharge status: Rehabilitation Facility | Payer: Medicaid Other | Attending: Neurosurgery | Admitting: Neurosurgery

## 2013-08-18 ENCOUNTER — Encounter (HOSPITAL_COMMUNITY): Payer: Self-pay | Admitting: Emergency Medicine

## 2013-08-18 ENCOUNTER — Emergency Department (HOSPITAL_COMMUNITY): Payer: Medicaid Other | Admitting: Anesthesiology

## 2013-08-18 ENCOUNTER — Encounter (HOSPITAL_COMMUNITY)
Admission: EM | Disposition: A | Discharge status: Rehabilitation Facility | Payer: Self-pay | Source: Home / Self Care | Attending: Neurosurgery

## 2013-08-18 ENCOUNTER — Encounter (HOSPITAL_COMMUNITY): Payer: Medicaid Other | Admitting: Anesthesiology

## 2013-08-18 ENCOUNTER — Inpatient Hospital Stay (HOSPITAL_COMMUNITY): Payer: Medicaid Other

## 2013-08-18 DIAGNOSIS — I169 Hypertensive crisis, unspecified: Secondary | ICD-10-CM

## 2013-08-18 DIAGNOSIS — J9600 Acute respiratory failure, unspecified whether with hypoxia or hypercapnia: Secondary | ICD-10-CM

## 2013-08-18 DIAGNOSIS — R7309 Other abnormal glucose: Secondary | ICD-10-CM | POA: Diagnosis present

## 2013-08-18 DIAGNOSIS — R4701 Aphasia: Secondary | ICD-10-CM | POA: Diagnosis present

## 2013-08-18 DIAGNOSIS — R4020 Unspecified coma: Secondary | ICD-10-CM | POA: Diagnosis present

## 2013-08-18 DIAGNOSIS — F1410 Cocaine abuse, uncomplicated: Secondary | ICD-10-CM | POA: Diagnosis present

## 2013-08-18 DIAGNOSIS — I674 Hypertensive encephalopathy: Secondary | ICD-10-CM | POA: Diagnosis present

## 2013-08-18 DIAGNOSIS — E876 Hypokalemia: Secondary | ICD-10-CM | POA: Diagnosis present

## 2013-08-18 DIAGNOSIS — W19XXXA Unspecified fall, initial encounter: Secondary | ICD-10-CM | POA: Diagnosis present

## 2013-08-18 DIAGNOSIS — Z5189 Encounter for other specified aftercare: Secondary | ICD-10-CM | POA: Diagnosis not present

## 2013-08-18 DIAGNOSIS — B1920 Unspecified viral hepatitis C without hepatic coma: Secondary | ICD-10-CM | POA: Diagnosis present

## 2013-08-18 DIAGNOSIS — F329 Major depressive disorder, single episode, unspecified: Secondary | ICD-10-CM | POA: Diagnosis present

## 2013-08-18 DIAGNOSIS — J9601 Acute respiratory failure with hypoxia: Secondary | ICD-10-CM

## 2013-08-18 DIAGNOSIS — I10 Essential (primary) hypertension: Secondary | ICD-10-CM | POA: Diagnosis present

## 2013-08-18 DIAGNOSIS — D696 Thrombocytopenia, unspecified: Secondary | ICD-10-CM | POA: Diagnosis not present

## 2013-08-18 DIAGNOSIS — R1313 Dysphagia, pharyngeal phase: Secondary | ICD-10-CM | POA: Diagnosis present

## 2013-08-18 DIAGNOSIS — K2970 Gastritis, unspecified, without bleeding: Secondary | ICD-10-CM | POA: Diagnosis present

## 2013-08-18 DIAGNOSIS — G936 Cerebral edema: Secondary | ICD-10-CM | POA: Diagnosis present

## 2013-08-18 DIAGNOSIS — K2990 Gastroduodenitis, unspecified, without bleeding: Secondary | ICD-10-CM

## 2013-08-18 DIAGNOSIS — F1010 Alcohol abuse, uncomplicated: Secondary | ICD-10-CM | POA: Diagnosis present

## 2013-08-18 DIAGNOSIS — F17200 Nicotine dependence, unspecified, uncomplicated: Secondary | ICD-10-CM | POA: Diagnosis present

## 2013-08-18 DIAGNOSIS — N281 Cyst of kidney, acquired: Secondary | ICD-10-CM | POA: Diagnosis present

## 2013-08-18 DIAGNOSIS — D3500 Benign neoplasm of unspecified adrenal gland: Secondary | ICD-10-CM | POA: Diagnosis present

## 2013-08-18 DIAGNOSIS — G935 Compression of brain: Secondary | ICD-10-CM | POA: Diagnosis present

## 2013-08-18 DIAGNOSIS — R471 Dysarthria and anarthria: Secondary | ICD-10-CM | POA: Diagnosis present

## 2013-08-18 DIAGNOSIS — I619 Nontraumatic intracerebral hemorrhage, unspecified: Secondary | ICD-10-CM | POA: Diagnosis present

## 2013-08-18 DIAGNOSIS — F3289 Other specified depressive episodes: Secondary | ICD-10-CM | POA: Diagnosis present

## 2013-08-18 DIAGNOSIS — K8020 Calculus of gallbladder without cholecystitis without obstruction: Secondary | ICD-10-CM | POA: Diagnosis present

## 2013-08-18 DIAGNOSIS — Z79899 Other long term (current) drug therapy: Secondary | ICD-10-CM | POA: Diagnosis not present

## 2013-08-18 DIAGNOSIS — I614 Nontraumatic intracerebral hemorrhage in cerebellum: Secondary | ICD-10-CM | POA: Diagnosis present

## 2013-08-18 HISTORY — PX: CRANIECTOMY: SHX331

## 2013-08-18 LAB — URINALYSIS, ROUTINE W REFLEX MICROSCOPIC
BILIRUBIN URINE: NEGATIVE
GLUCOSE, UA: 100 mg/dL — AB
Hgb urine dipstick: NEGATIVE
Ketones, ur: NEGATIVE mg/dL
Leukocytes, UA: NEGATIVE
NITRITE: NEGATIVE
PH: 7 (ref 5.0–8.0)
Protein, ur: NEGATIVE mg/dL
SPECIFIC GRAVITY, URINE: 1.012 (ref 1.005–1.030)
Urobilinogen, UA: 1 mg/dL (ref 0.0–1.0)

## 2013-08-18 LAB — DIFFERENTIAL
Basophils Absolute: 0 10*3/uL (ref 0.0–0.1)
Basophils Relative: 0 % (ref 0–1)
EOS PCT: 1 % (ref 0–5)
Eosinophils Absolute: 0.1 10*3/uL (ref 0.0–0.7)
LYMPHS ABS: 4.6 10*3/uL — AB (ref 0.7–4.0)
LYMPHS PCT: 58 % — AB (ref 12–46)
MONO ABS: 0.8 10*3/uL (ref 0.1–1.0)
Monocytes Relative: 9 % (ref 3–12)
NEUTROS ABS: 2.6 10*3/uL (ref 1.7–7.7)
Neutrophils Relative %: 32 % — ABNORMAL LOW (ref 43–77)

## 2013-08-18 LAB — I-STAT CHEM 8, ED
BUN: 8 mg/dL (ref 6–23)
CALCIUM ION: 1.18 mmol/L (ref 1.12–1.23)
CREATININE: 0.7 mg/dL (ref 0.50–1.10)
Chloride: 104 mEq/L (ref 96–112)
Glucose, Bld: 175 mg/dL — ABNORMAL HIGH (ref 70–99)
HCT: 49 % — ABNORMAL HIGH (ref 36.0–46.0)
HEMOGLOBIN: 16.7 g/dL — AB (ref 12.0–15.0)
Potassium: 3.5 mEq/L — ABNORMAL LOW (ref 3.7–5.3)
SODIUM: 143 meq/L (ref 137–147)
TCO2: 21 mmol/L (ref 0–100)

## 2013-08-18 LAB — RAPID URINE DRUG SCREEN, HOSP PERFORMED
Amphetamines: NOT DETECTED
Barbiturates: NOT DETECTED
Benzodiazepines: NOT DETECTED
COCAINE: POSITIVE — AB
OPIATES: NOT DETECTED
Tetrahydrocannabinol: NOT DETECTED

## 2013-08-18 LAB — CBG MONITORING, ED: Glucose-Capillary: 200 mg/dL — ABNORMAL HIGH (ref 70–99)

## 2013-08-18 LAB — CBC
HEMATOCRIT: 42.3 % (ref 36.0–46.0)
Hemoglobin: 14.6 g/dL (ref 12.0–15.0)
MCH: 36.6 pg — ABNORMAL HIGH (ref 26.0–34.0)
MCHC: 34.5 g/dL (ref 30.0–36.0)
MCV: 106 fL — AB (ref 78.0–100.0)
Platelets: 131 10*3/uL — ABNORMAL LOW (ref 150–400)
RBC: 3.99 MIL/uL (ref 3.87–5.11)
RDW: 12.6 % (ref 11.5–15.5)
WBC: 8.2 10*3/uL (ref 4.0–10.5)

## 2013-08-18 LAB — COMPREHENSIVE METABOLIC PANEL
ALT: 120 U/L — ABNORMAL HIGH (ref 0–35)
AST: 126 U/L — AB (ref 0–37)
Albumin: 3.2 g/dL — ABNORMAL LOW (ref 3.5–5.2)
Alkaline Phosphatase: 98 U/L (ref 39–117)
BILIRUBIN TOTAL: 1 mg/dL (ref 0.3–1.2)
BUN: 10 mg/dL (ref 6–23)
CALCIUM: 9.2 mg/dL (ref 8.4–10.5)
CHLORIDE: 105 meq/L (ref 96–112)
CO2: 21 meq/L (ref 19–32)
CREATININE: 0.66 mg/dL (ref 0.50–1.10)
GLUCOSE: 174 mg/dL — AB (ref 70–99)
Potassium: 3.7 mEq/L (ref 3.7–5.3)
Sodium: 141 mEq/L (ref 137–147)
Total Protein: 7 g/dL (ref 6.0–8.3)

## 2013-08-18 LAB — MRSA PCR SCREENING: MRSA BY PCR: NEGATIVE

## 2013-08-18 LAB — PROTIME-INR
INR: 1.05 (ref 0.00–1.49)
Prothrombin Time: 13.5 seconds (ref 11.6–15.2)

## 2013-08-18 LAB — I-STAT TROPONIN, ED: Troponin i, poc: 0.02 ng/mL (ref 0.00–0.08)

## 2013-08-18 LAB — ETHANOL: ALCOHOL ETHYL (B): 23 mg/dL — AB (ref 0–11)

## 2013-08-18 LAB — TRIGLYCERIDES: TRIGLYCERIDES: 84 mg/dL (ref <150)

## 2013-08-18 LAB — PREPARE RBC (CROSSMATCH)

## 2013-08-18 LAB — APTT: aPTT: 29 seconds (ref 24–37)

## 2013-08-18 SURGERY — CRANIECTOMY POSTERIOR FOSSA DECOMPRESSION
Anesthesia: General

## 2013-08-18 MED ORDER — PROPOFOL 10 MG/ML IV EMUL
INTRAVENOUS | Status: AC
  Filled 2013-08-18: qty 100

## 2013-08-18 MED ORDER — ARTIFICIAL TEARS OP OINT
TOPICAL_OINTMENT | OPHTHALMIC | Status: DC | PRN
  Administered 2013-08-18: 1 via OPHTHALMIC

## 2013-08-18 MED ORDER — MANNITOL 25 % IV SOLN
50.0000 g | Freq: Once | INTRAVENOUS | Status: DC
  Filled 2013-08-18: qty 200

## 2013-08-18 MED ORDER — SODIUM CHLORIDE 0.9 % IV SOLN
INTRAVENOUS | Status: DC | PRN
  Administered 2013-08-18: 15:00:00 via INTRAVENOUS

## 2013-08-18 MED ORDER — SODIUM CHLORIDE 0.9 % IV SOLN: INTRAVENOUS | Status: DC

## 2013-08-18 MED ORDER — MANNITOL 25 % IV SOLN
50.0000 g | Freq: Once | INTRAVENOUS | Status: AC
  Administered 2013-08-18: 50 g via INTRAVENOUS
  Filled 2013-08-18: qty 200

## 2013-08-18 MED ORDER — FENTANYL CITRATE 0.05 MG/ML IJ SOLN
INTRAMUSCULAR | Status: DC | PRN
  Administered 2013-08-18 (×2): 100 ug via INTRAVENOUS
  Administered 2013-08-18: 50 ug via INTRAVENOUS
  Administered 2013-08-18: 100 ug via INTRAVENOUS
  Administered 2013-08-18: 150 ug via INTRAVENOUS
  Administered 2013-08-18: 50 ug via INTRAVENOUS
  Administered 2013-08-18 (×2): 100 ug via INTRAVENOUS

## 2013-08-18 MED ORDER — PROPOFOL 10 MG/ML IV BOLUS
INTRAVENOUS | Status: AC
  Filled 2013-08-18: qty 20

## 2013-08-18 MED ORDER — SENNOSIDES-DOCUSATE SODIUM 8.6-50 MG PO TABS
1.0000 | ORAL_TABLET | Freq: Two times a day (BID) | ORAL | Status: DC
  Administered 2013-08-19 – 2013-08-26 (×12): 1 via ORAL
  Filled 2013-08-18 (×15): qty 1

## 2013-08-18 MED ORDER — ROCURONIUM BROMIDE 50 MG/5ML IV SOLN
INTRAVENOUS | Status: AC
  Administered 2013-08-18: 80 mg
  Filled 2013-08-18: qty 2

## 2013-08-18 MED ORDER — ACETAMINOPHEN 650 MG RE SUPP
650.0000 mg | RECTAL | Status: DC | PRN
Start: 2013-08-18 — End: 2013-08-26

## 2013-08-18 MED ORDER — BUPIVACAINE HCL 0.5 % IJ SOLN
INTRAMUSCULAR | Status: DC | PRN
  Administered 2013-08-18: 3 mL

## 2013-08-18 MED ORDER — BACITRACIN ZINC 500 UNIT/GM EX OINT
TOPICAL_OINTMENT | CUTANEOUS | Status: DC | PRN
  Administered 2013-08-18: 1 via TOPICAL

## 2013-08-18 MED ORDER — MANNITOL 25 % IV SOLN
25.0000 g | Freq: Once | INTRAVENOUS | Status: DC
  Filled 2013-08-18: qty 100

## 2013-08-18 MED ORDER — THROMBIN 5000 UNITS EX SOLR
OROMUCOSAL | Status: DC | PRN
  Administered 2013-08-18: 17:00:00 via TOPICAL

## 2013-08-18 MED ORDER — PANTOPRAZOLE SODIUM 40 MG IV SOLR
40.0000 mg | Freq: Every day | INTRAVENOUS | Status: DC
  Administered 2013-08-18 – 2013-08-19 (×2): 40 mg via INTRAVENOUS
  Filled 2013-08-18 (×3): qty 40

## 2013-08-18 MED ORDER — NITROGLYCERIN 0.2 MG/ML ON CALL CATH LAB
INTRAVENOUS | Status: DC | PRN
  Administered 2013-08-18 (×2): 40 ug via INTRAVENOUS

## 2013-08-18 MED ORDER — SODIUM CHLORIDE 0.9 % IR SOLN
Status: DC | PRN
  Administered 2013-08-18: 17:00:00

## 2013-08-18 MED ORDER — PANTOPRAZOLE SODIUM 40 MG IV SOLR: 40.0000 mg | Freq: Every day | INTRAVENOUS | Status: DC

## 2013-08-18 MED ORDER — LIDOCAINE-EPINEPHRINE 1 %-1:100000 IJ SOLN
INTRAMUSCULAR | Status: DC | PRN
  Administered 2013-08-18: 3 mL

## 2013-08-18 MED ORDER — FENTANYL CITRATE 0.05 MG/ML IJ SOLN
INTRAMUSCULAR | Status: AC
  Filled 2013-08-18: qty 5

## 2013-08-18 MED ORDER — THROMBIN 20000 UNITS EX SOLR
CUTANEOUS | Status: DC | PRN
  Administered 2013-08-18: 17:00:00 via TOPICAL

## 2013-08-18 MED ORDER — LABETALOL HCL 5 MG/ML IV SOLN: 10.0000 mg | INTRAVENOUS | Status: DC | PRN

## 2013-08-18 MED ORDER — ETOMIDATE 2 MG/ML IV SOLN
INTRAVENOUS | Status: AC | PRN
  Administered 2013-08-18: 20 mg via INTRAVENOUS

## 2013-08-18 MED ORDER — CEFAZOLIN SODIUM-DEXTROSE 2-3 GM-% IV SOLR
INTRAVENOUS | Status: DC | PRN
  Administered 2013-08-18: 2 g via INTRAVENOUS

## 2013-08-18 MED ORDER — NICARDIPINE HCL IN NACL 20-0.86 MG/200ML-% IV SOLN
3.0000 mg/h | INTRAVENOUS | Status: DC
Start: 2013-08-18 — End: 2013-08-21
  Administered 2013-08-18: 3 mg/h via INTRAVENOUS
  Administered 2013-08-19: 2 mg/h via INTRAVENOUS
  Administered 2013-08-19 (×2): 3 mg/h via INTRAVENOUS
  Filled 2013-08-18 (×5): qty 200

## 2013-08-18 MED ORDER — LIDOCAINE HCL (CARDIAC) 20 MG/ML IV SOLN
INTRAVENOUS | Status: AC
  Filled 2013-08-18: qty 5

## 2013-08-18 MED ORDER — NICARDIPINE HCL IN NACL 20-0.86 MG/200ML-% IV SOLN
INTRAVENOUS | Status: DC | PRN
  Administered 2013-08-18: 1 mg/h via INTRAVENOUS

## 2013-08-18 MED ORDER — ROCURONIUM BROMIDE 100 MG/10ML IV SOLN
INTRAVENOUS | Status: DC | PRN
  Administered 2013-08-18: 50 mg via INTRAVENOUS

## 2013-08-18 MED ORDER — PHENYLEPHRINE HCL 10 MG/ML IJ SOLN
10.0000 mg | INTRAVENOUS | Status: DC | PRN
  Administered 2013-08-18: 25 ug/min via INTRAVENOUS

## 2013-08-18 MED ORDER — ACETAMINOPHEN 325 MG PO TABS
650.0000 mg | ORAL_TABLET | ORAL | Status: DC | PRN
  Filled 2013-08-18: qty 2

## 2013-08-18 MED ORDER — 0.9 % SODIUM CHLORIDE (POUR BTL) OPTIME
TOPICAL | Status: DC | PRN
  Administered 2013-08-18 (×2): 1000 mL

## 2013-08-18 MED ORDER — MORPHINE SULFATE 2 MG/ML IJ SOLN: 1.0000 mg | INTRAMUSCULAR | Status: DC | PRN

## 2013-08-18 MED ORDER — ROCURONIUM BROMIDE 50 MG/5ML IV SOLN
INTRAVENOUS | Status: AC | PRN
  Administered 2013-08-18: 80 mg via INTRAVENOUS

## 2013-08-18 MED ORDER — SUCCINYLCHOLINE CHLORIDE 20 MG/ML IJ SOLN
INTRAMUSCULAR | Status: AC
  Filled 2013-08-18: qty 1

## 2013-08-18 MED ORDER — PROPOFOL 10 MG/ML IV BOLUS
INTRAVENOUS | Status: DC | PRN
  Administered 2013-08-18 (×4): 50 mg via INTRAVENOUS

## 2013-08-18 MED ORDER — SODIUM CHLORIDE 0.9 % IV SOLN
INTRAVENOUS | Status: DC
  Administered 2013-08-18 – 2013-08-21 (×4): via INTRAVENOUS

## 2013-08-18 MED ORDER — HEMOSTATIC AGENTS (NO CHARGE) OPTIME
TOPICAL | Status: DC | PRN
  Administered 2013-08-18: 1 via TOPICAL

## 2013-08-18 MED ORDER — PROPOFOL 10 MG/ML IV EMUL
5.0000 ug/kg/min | Freq: Once | INTRAVENOUS | Status: AC
Start: 2013-08-18 — End: 2013-08-18
  Administered 2013-08-18: 5 ug/kg/min via INTRAVENOUS

## 2013-08-18 MED ORDER — SODIUM CHLORIDE 0.9 % IV SOLN
250.0000 mL | INTRAVENOUS | Status: DC | PRN
Start: 2013-08-18 — End: 2013-08-22

## 2013-08-18 MED ORDER — VECURONIUM BROMIDE 10 MG IV SOLR
INTRAVENOUS | Status: DC | PRN
  Administered 2013-08-18 (×2): 2 mg via INTRAVENOUS

## 2013-08-18 MED ORDER — ETOMIDATE 2 MG/ML IV SOLN
INTRAVENOUS | Status: AC
  Administered 2013-08-18: 20 mg
  Filled 2013-08-18: qty 20

## 2013-08-18 MED ORDER — PROPOFOL 10 MG/ML IV EMUL
0.0000 ug/kg/min | INTRAVENOUS | Status: DC
  Administered 2013-08-18: 5 ug/kg/min via INTRAVENOUS
  Administered 2013-08-19: 25 ug/kg/min via INTRAVENOUS
  Filled 2013-08-18 (×3): qty 100

## 2013-08-18 MED ORDER — SODIUM CHLORIDE 0.9 % IV SOLN
INTRAVENOUS | Status: DC | PRN
  Administered 2013-08-18: 16:00:00 via INTRAVENOUS

## 2013-08-18 MED ORDER — FENTANYL CITRATE 0.05 MG/ML IJ SOLN: 100.0000 ug | INTRAMUSCULAR | Status: DC | PRN

## 2013-08-18 SURGICAL SUPPLY — 91 items
ADH SKN CLS LQ APL DERMABOND (GAUZE/BANDAGES/DRESSINGS) ×2
APL SRG 60D 8 XTD TIP BNDBL (TIP) ×1
BLADE 10 SAFETY STRL DISP (BLADE) ×3 IMPLANT
BLADE SURG 15 STRL LF DISP TIS (BLADE) IMPLANT
BLADE SURG 15 STRL SS (BLADE)
BLADE SURG ROTATE 9660 (MISCELLANEOUS) ×2 IMPLANT
BLADE ULTRA TIP 2M (BLADE) IMPLANT
BRUSH SCRUB EZ 1% IODOPHOR (MISCELLANEOUS) IMPLANT
BUR ACORN 6.0 PRECISION (BURR) ×2 IMPLANT
BUR ACORN 6.0MM PRECISION (BURR) ×1
BUR ROUND FLUTED 4 SOFT TCH (BURR) ×1 IMPLANT
BUR ROUND FLUTED 4MM SOFT TCH (BURR) ×1
CANISTER SUCT 3000ML (MISCELLANEOUS) ×3 IMPLANT
CATH VENTRIC 35X38 W/TROCAR LG (CATHETERS) IMPLANT
CLIP TI MEDIUM 6 (CLIP) IMPLANT
CONT SPEC 4OZ CLIKSEAL STRL BL (MISCELLANEOUS) ×3 IMPLANT
CORDS BIPOLAR (ELECTRODE) ×3 IMPLANT
COVER TABLE BACK 60X90 (DRAPES) IMPLANT
DECANTER SPIKE VIAL GLASS SM (MISCELLANEOUS) ×3 IMPLANT
DERMABOND ADHESIVE PROPEN (GAUZE/BANDAGES/DRESSINGS) ×4
DERMABOND ADVANCED .7 DNX6 (GAUZE/BANDAGES/DRESSINGS) IMPLANT
DRAIN SNY WOU 7FLT (WOUND CARE) IMPLANT
DRAPE LAPAROTOMY 100X72 PEDS (DRAPES) ×3 IMPLANT
DRAPE MICROSCOPE LEICA (MISCELLANEOUS) ×3 IMPLANT
DRAPE NEUROLOGICAL W/INCISE (DRAPES) IMPLANT
DRAPE PROXIMA HALF (DRAPES) ×2 IMPLANT
DRAPE WARM FLUID 44X44 (DRAPE) ×3 IMPLANT
DRESSING TELFA 8X3 (GAUZE/BANDAGES/DRESSINGS) ×3 IMPLANT
DRSG OPSITE 4X5.5 SM (GAUZE/BANDAGES/DRESSINGS) IMPLANT
DRSG OPSITE POSTOP 4X8 (GAUZE/BANDAGES/DRESSINGS) ×2 IMPLANT
DURAMATRIX ONLAY 2X2 (Neuro Prosthesis/Implant) ×4 IMPLANT
DURAPREP 26ML APPLICATOR (WOUND CARE) ×3 IMPLANT
DURASEAL APPLICATOR TIP (TIP) ×2 IMPLANT
DURASEAL SPINE SEALANT 3ML (MISCELLANEOUS) ×2 IMPLANT
ELECT CAUTERY BLADE 6.4 (BLADE) ×3 IMPLANT
ELECT REM PT RETURN 9FT ADLT (ELECTROSURGICAL) ×3
ELECTRODE REM PT RTRN 9FT ADLT (ELECTROSURGICAL) ×1 IMPLANT
EVACUATOR 1/8 PVC DRAIN (DRAIN) IMPLANT
EVACUATOR SILICONE 100CC (DRAIN) IMPLANT
GAUZE SPONGE 4X4 16PLY XRAY LF (GAUZE/BANDAGES/DRESSINGS) IMPLANT
GLOVE BIO SURGEON STRL SZ8 (GLOVE) ×2 IMPLANT
GLOVE BIOGEL PI IND STRL 7.5 (GLOVE) ×1 IMPLANT
GLOVE BIOGEL PI INDICATOR 7.5 (GLOVE) ×8
GLOVE ECLIPSE 7.0 STRL STRAW (GLOVE) ×4 IMPLANT
GLOVE EXAM NITRILE LRG STRL (GLOVE) IMPLANT
GLOVE EXAM NITRILE MD LF STRL (GLOVE) IMPLANT
GLOVE EXAM NITRILE XL STR (GLOVE) IMPLANT
GLOVE EXAM NITRILE XS STR PU (GLOVE) IMPLANT
GLOVE INDICATOR 8.5 STRL (GLOVE) ×2 IMPLANT
GOWN STRL REUS W/ TWL LRG LVL3 (GOWN DISPOSABLE) ×2 IMPLANT
GOWN STRL REUS W/ TWL XL LVL3 (GOWN DISPOSABLE) IMPLANT
GOWN STRL REUS W/TWL 2XL LVL3 (GOWN DISPOSABLE) IMPLANT
GOWN STRL REUS W/TWL LRG LVL3 (GOWN DISPOSABLE) ×6
GOWN STRL REUS W/TWL XL LVL3 (GOWN DISPOSABLE) ×3
HEMOSTAT POWDER SURGIFOAM 1G (HEMOSTASIS) ×2 IMPLANT
HEMOSTAT SURGICEL 2X14 (HEMOSTASIS) ×3 IMPLANT
KIT BASIN OR (CUSTOM PROCEDURE TRAY) ×3 IMPLANT
KIT DRAIN CSF ACCUDRAIN (MISCELLANEOUS) IMPLANT
KIT ROOM TURNOVER OR (KITS) ×3 IMPLANT
MARKER SKIN DUAL TIP RULER LAB (MISCELLANEOUS) IMPLANT
NDL HYPO 25X1 1.5 SAFETY (NEEDLE) ×1 IMPLANT
NDL SPNL 18GX3.5 QUINCKE PK (NEEDLE) IMPLANT
NEEDLE HYPO 25X1 1.5 SAFETY (NEEDLE) ×3 IMPLANT
NEEDLE SPNL 18GX3.5 QUINCKE PK (NEEDLE) IMPLANT
NS IRRIG 1000ML POUR BTL (IV SOLUTION) ×3 IMPLANT
PACK CRANIOTOMY (CUSTOM PROCEDURE TRAY) ×3 IMPLANT
PAD ARMBOARD 7.5X6 YLW CONV (MISCELLANEOUS) ×9 IMPLANT
PATTIES SURGICAL .5 X.5 (GAUZE/BANDAGES/DRESSINGS) IMPLANT
PATTIES SURGICAL .5 X3 (DISPOSABLE) IMPLANT
PATTIES SURGICAL 1/4 X 3 (GAUZE/BANDAGES/DRESSINGS) IMPLANT
PATTIES SURGICAL 1X1 (DISPOSABLE) IMPLANT
PIN MAYFIELD SKULL DISP (PIN) IMPLANT
SPONGE GAUZE 4X4 12PLY (GAUZE/BANDAGES/DRESSINGS) IMPLANT
SPONGE NEURO XRAY DETECT 1X3 (DISPOSABLE) IMPLANT
SPONGE SURGIFOAM ABS GEL 100 (HEMOSTASIS) ×2 IMPLANT
STAPLER SKIN PROX WIDE 3.9 (STAPLE) ×3 IMPLANT
SUT ETHILON 3 0 FSL (SUTURE) IMPLANT
SUT NURALON 4 0 TR CR/8 (SUTURE) ×6 IMPLANT
SUT PROLENE 6 0 BV (SUTURE) IMPLANT
SUT VIC AB 0 CT1 18XCR BRD8 (SUTURE) IMPLANT
SUT VIC AB 0 CT1 8-18 (SUTURE) ×6
SUT VIC AB 2-0 CT1 18 (SUTURE) ×3 IMPLANT
SUT VIC AB 3-0 SH 8-18 (SUTURE) ×3 IMPLANT
SUT VICRYL 3-0 RB1 18 ABS (SUTURE) ×4 IMPLANT
SYR 20ML ECCENTRIC (SYRINGE) ×3 IMPLANT
SYR CONTROL 10ML LL (SYRINGE) ×3 IMPLANT
TOWEL OR 17X24 6PK STRL BLUE (TOWEL DISPOSABLE) IMPLANT
TOWEL OR 17X26 10 PK STRL BLUE (TOWEL DISPOSABLE) ×3 IMPLANT
TRAY FOLEY CATH 14FRSI W/METER (CATHETERS) IMPLANT
UNDERPAD 30X30 INCONTINENT (UNDERPADS AND DIAPERS) IMPLANT
WATER STERILE IRR 1000ML POUR (IV SOLUTION) ×3 IMPLANT

## 2013-08-18 NOTE — Consult Note (Addendum)
Referring Physician: Kathyrn Sheriff    Chief Complaint: Code stroke --hypertensive bleed  HPI:                                                                                                                                         [name] is an 58 y.o. female who took a shower and then suddenly had a sever HA (worst HA of her life) then fell down.  There is question of loss consciousness. EMS was called and patient was transported to ER as Code Stroke. While in route she had emisis and SBP was 230.  CT head showed a large cerebellar bleed. Patient was brought back to ER, intubated and Neurosurgery was consulted for possible immediate evacuation. Pateitn was given 5 grams mannitol and Cardiene was started to being BP down. Neurosurgery arrived and agreed to take patient for posterior decompression.   Date last known well: Date: 08/18/2013 Time last known well: Time: 13:00 tPA Given: No: bleed  Past Medical History  Diagnosis Date  . Hepatitis C   . Pneumonia   . Hypertension     Past Surgical History  Procedure Laterality Date  . Neck surgery    . Shoulder surgery Bilateral     Family History  Problem Relation Age of Onset  . Diabetes Father   . Diabetes Sister    Social History:  reports that she has been smoking Cigarettes.  She has a 80 pack-year smoking history. She has never used smokeless tobacco. She reports that she drinks about 27.6 ounces of alcohol per week. She reports that she uses illicit drugs (Cocaine).  Allergies:  Allergies  Allergen Reactions  . Ibuprofen Hives and Other (See Comments)    Burning of the skin.    Medications:                                                                                                                           Current Facility-Administered Medications  Medication Dose Route Frequency Provider Last Rate Last Dose  . lidocaine (cardiac) 100 mg/69ml (XYLOCAINE) 20 MG/ML injection 2%           . niCARdipine (CARDENE-IV)  infusion (0.1 mg/ml)  3-15 mg/hr Intravenous Continuous Marliss Coots, PA-C 30 mL/hr at 08/18/13 1411 3 mg/hr at 08/18/13 1411  . propofol (DIPRIVAN) 10 mg/ml infusion  5-70 mcg/kg/min Intravenous Once  Mervin Kung, MD      . succinylcholine (ANECTINE) 20 MG/ML injection            Current Outpatient Prescriptions  Medication Sig Dispense Refill  . acetaminophen-codeine (TYLENOL #3) 300-30 MG per tablet Take 1 tablet by mouth every 4 (four) hours as needed.  60 tablet  0  . albuterol (PROVENTIL HFA;VENTOLIN HFA) 108 (90 BASE) MCG/ACT inhaler Inhale 2 puffs into the lungs every 6 (six) hours as needed for wheezing.  3 Inhaler  3  . FLUoxetine (PROZAC) 20 MG capsule Take 1 capsule (20 mg total) by mouth daily.  30 capsule  3  . hydrOXYzine (ATARAX/VISTARIL) 10 MG tablet Take 1-2 tablets (10-20 mg total) by mouth every 8 (eight) hours as needed for itching.  45 tablet  0  . metoprolol tartrate (LOPRESSOR) 25 MG tablet Take 0.5 tablets (12.5 mg total) by mouth 2 (two) times daily.  60 tablet  5  . nicotine (EQ NICOTINE) 14 mg/24hr patch Place 1 patch (14 mg total) onto the skin daily.  28 patch  0  . ofloxacin (OCUFLOX) 0.3 % ophthalmic solution Place 1 drop into both eyes 4 (four) times daily.  10 mL  2  . ondansetron (ZOFRAN ODT) 4 MG disintegrating tablet Take 1 tablet (4 mg total) by mouth every 8 (eight) hours as needed for nausea or vomiting.  30 tablet  0  . predniSONE (DELTASONE) 20 MG tablet Take 2 tablets (40 mg total) by mouth daily with breakfast.  10 tablet  0  . terbinafine (LAMISIL AT) 1 % cream Apply 1 application topically 2 (two) times daily.  30 g  3  . thiamine (VITAMIN B-1) 100 MG tablet Take 1 tablet (100 mg total) by mouth daily.  30 tablet  3  . Vitamin D, Ergocalciferol, (DRISDOL) 50000 UNITS CAPS capsule Take 1 capsule (50,000 Units total) by mouth every 7 (seven) days.  10 capsule  2  . zolpidem (AMBIEN) 5 MG tablet Take 5 mg by mouth at bedtime as needed for sleep.          ROS:                                                                                                                                       History obtained from unobtainable from patient due to mental status   Neurologic Examination:  Blood pressure 227/133, pulse 96, resp. rate 25, SpO2 100.00%.  Mental Status: Patient will follow minimal commands such as wiggling toes looking toward voice called and squeezing hands.  No verbalizations noted.  Cranial Nerves: II: patient does not respond confrontation bilaterally, pupils right 2 mm, left 2 mm,and reactive bilaterally III,IV,VI: doll's response present bilaterally.  V,VII: corneal reflex present on the left, absent on the right VIII: patient does not respond to verbal stimuli IX,X: gag reflex present, XI: trapezius strength unable to test bilaterally XII: tongue midline Motor: Bilateral grip 4/5 moving bilateral arms , and withdrew left arm to noxious stimuli. Wiggles bilateral toes.  Sensory: Responded to noxious stimuli in all 4 extremities.  Deep Tendon Reflexes:  2+ bilateral UE, 1+ bilateral KJ and no AJ Plantars: absent bilaterally Cerebellar: Unable to perform    Lab Results: Basic Metabolic Panel:  Recent Labs Lab 08/17/13 1316 08/18/13 1345  NA  --  143  K  --  3.5*  CL  --  104  GLUCOSE  --  175*  BUN  --  8  CREATININE 0.90 0.70    Liver Function Tests: No results found for this basename: AST, ALT, ALKPHOS, BILITOT, PROT, ALBUMIN,  in the last 168 hours No results found for this basename: LIPASE, AMYLASE,  in the last 168 hours No results found for this basename: AMMONIA,  in the last 168 hours  CBC:  Recent Labs Lab 08/18/13 1341 08/18/13 1345  WBC 8.2  --   NEUTROABS 2.6  --   HGB 14.6 16.7*  HCT 42.3 49.0*  MCV 106.0*  --   PLT 131*  --     Cardiac Enzymes: No results found for this  basename: CKTOTAL, CKMB, CKMBINDEX, TROPONINI,  in the last 168 hours  Lipid Panel: No results found for this basename: CHOL, TRIG, HDL, CHOLHDL, VLDL, LDLCALC,  in the last 168 hours  CBG:  Recent Labs Lab 08/18/13 [place] 200*    Microbiology: Results for orders placed during the hospital encounter of 08/12/08  URINE CULTURE     Status: None   Collection Time    08/12/08  5:37 PM      Result Value Ref Range Status   Specimen Description URINE, CLEAN CATCH   Final   Special Requests NONE   Final   Colony Count 1,000 COLONIES/ML   Final   Culture INSIGNIFICANT GROWTH   Final   Report Status 08/14/2008 FINAL   Final    Coagulation Studies: No results found for this basename: LABPROT, INR,  in the last 72 hours  Imaging: Ct Head Wo Contrast  08/18/2013   CLINICAL DATA:  Code stroke.  EXAM: CT HEAD WITHOUT CONTRAST  TECHNIQUE: Contiguous axial images were obtained from the base of the skull through the vertex without intravenous contrast.  COMPARISON:  None.  FINDINGS: There is a large acute intraparenchymal hemorrhage in the cerebellum, centered to the right of midline, but also crosses the midline. The hemorrhage measures approximately 4.8 x 4.7 cm axial dimension and causes significant mass effect in the posterior fossa. The fourth ventricle appears to be nearly completely effaced, and there is loss of the normal basilar cisterns. On image number 2, there suggestion of tonsillar herniation. There is a rim of edema surrounding the hemorrhage. Early hydrocephalus involving the temporal horns of the lateral ventricles is suggested, but no gross/significant hydrocephalus is present at this time. Moderate degree of chronic microvascular ischemic changes are noted in the periventricular and  subcortical white matter bilaterally. No supratentorial evidence of acute infarction.  The skull is intact. The visualized paranasal sinuses and mastoid air cells are clear.  IMPRESSION: Large acute  intraparenchymal cerebellar hemorrhage with significant mass effect in the posterior fossa and probable tonsillar herniation. Fourth ventricle appears nearly completely effaced. Cannot exclude early hydrocephalus. Critical Value/emergent results were called by telephone at the time of interpretation on 08/18/2013 at 1:59 PM to Dr. Roland Rack , who was already aware of these results.   Electronically Signed   By: Curlene Dolphin M.D.   On: 08/18/2013 14:01   Mr Abdomen W Wo Contrast  08/17/2013   CLINICAL DATA:  Evaluate right renal lesion.  EXAM: MRI ABDOMEN WITHOUT AND WITH CONTRAST  TECHNIQUE: Multiplanar multisequence MR imaging of the abdomen was performed both before and after the administration of intravenous contrast.  CONTRAST:  41mL MULTIHANCE GADOBENATE DIMEGLUMINE 529 MG/ML IV SOLN  COMPARISON:  08/04/2013  FINDINGS: No pleural effusion identified. There is no focal liver abnormality. The gallbladder is normal. Stress set small stone is noted within the gallbladder. The common bile duct is normal in caliber. No intrahepatic bile duct dilatation. Normal appearance of the pancreas. The spleen is unremarkable. Normal appearance of the right adrenal gland. Loss of signal from the left adrenal gland between the in phase and out of phase images noted compatible with a benign adenoma. Small cysts are identified within the inferior pole of the left kidney. Hemorrhagic cyst within the midpole of the right kidney measures 1.7 cm, image 58 of series 1200. 1.7 cm complicated cyst within the inferior pole of the right kidney is identified. No significant enhancement is associated with this structure compatible with a Bosniak category 2 cyst. There is a hemorrhagic cyst within the inferior pole of the left kidney which measures 5 mm. Bosniak category 1 cysts are also noted within the inferior pole the left kidney. Normal caliber of the abdominal aorta. No aneurysm. No upper abdominal adenopathy.  Review of the  visualized osseous structures it is unremarkable.  IMPRESSION: 1. Cysts within the right kidney have imaging and enhancement characteristics compatible of benign Bosniak category 2 cysts. 2. Bosniak category 1 and 2 cysts within the left kidney. 3. Left adrenal adenoma.   Electronically Signed   By: Kerby Moors M.D.   On: 08/17/2013 16:54       Assessment and plan discussed with with attending physician and they are in agreement.    Etta Quill PA-C [facility] [phone]  08/18/2013, 2:11 PM   Assessment: 58 y.o. female presenting to ED as a code stroke and found to have a large cerebellar bleed. Patient was immediately intubated and and neurosurgery was consulted. The patient was rapidly worsenign and therefore was given 50g mannitol.  Patient will be brought to OR for emergent decompression. At this time we will continue to follow along with neurosurgery.   Stroke Risk Factors - hypertension  1) Agree with emergent nature to the surgery 2) cardene for BP > 180.  3) mannitol given 4) will continue to follow.   This patient is critically ill and at significant risk of neurological worsening, death and care requires constant monitoring of vital signs, hemodynamics,respiratory and cardiac monitoring, neurological assessment, discussion with family, other specialists and medical decision making of high complexity. I spent 45 minutes of neurocritical care time  in the care of  this patient.  Roland Rack, MD [facility] [phone]  If 7pm- 7am, please page neurology on call as  listed in [place]. 08/18/2013  2:46 PM

## 2013-08-18 NOTE — Anesthesia Preprocedure Evaluation (Signed)
Anesthesia Evaluation  Patient identified by MRN, date of birth, ID band Patient awake    Reviewed: Allergy & Precautions, H&P , NPO status , Patient's Chart, lab work & pertinent test results  Airway      Comment: Already intubated. Dental   Pulmonary Current Smoker,          Cardiovascular hypertension,     Neuro/Psych Pt with an acute intraparenchymal cerebellar hemmorhage with mass effect. CVA    GI/Hepatic (+)     substance abuse  cocaine use, Hepatitis -, C  Endo/Other    Renal/GU      Musculoskeletal   Abdominal   Peds  Hematology   Anesthesia Other Findings   Reproductive/Obstetrics                           Anesthesia Physical Anesthesia Plan  ASA: IV and emergent  Anesthesia Plan: General   Post-op Pain Management:    Induction: Intravenous  Airway Management Planned: Oral ETT  Additional Equipment: Arterial line and CVP  Intra-op Plan:   Post-operative Plan: Extubation in OR  Informed Consent: I have reviewed the patients History and Physical, chart, labs and discussed the procedure including the risks, benefits and alternatives for the proposed anesthesia with the patient or authorized representative who has indicated his/her understanding and acceptance.     Plan Discussed with: CRNA, Anesthesiologist and Surgeon  Anesthesia Plan Comments:         Anesthesia Quick Evaluation

## 2013-08-18 NOTE — ED Notes (Signed)
Temp foley inserted.

## 2013-08-18 NOTE — Consult Note (Signed)
PULMONARY / CRITICAL CARE MEDICINE  Name: [name] MRN: [id number] DOB: January 14, 1956    ADMISSION DATE:  08/18/2013 CONSULTATION DATE:  08/18/2013  REFERRING MD :  Conchita Paris PRIMARY SERVICE:  PCCM  CHIEF COMPLAINT:  ICH s/p emergent posterior fossa decompression  BRIEF PATIENT DESCRIPTION: 58 y.o. F with Hep C, HTN brought to ED via EMS after experiencing sudden onset severe headache followed by a fall and slurred speech.  In ED was intubated for airway protection.  CT head revealed a large acute intraparenchymal cerebellar hemorrage with significant mass effect in posterior fossa.  She was taken to the OR emergently for a posterior fossa decompression. PCCM was consulted post op.  SIGNIFICANT EVENTS / STUDIES:  CT head 5/20 >>> large acute intraparenchymal cerebellar hemorrage with significant mass effect in posterior fossa, 4th ventricle almost fully effaced. OR 5/20 >>> emergent posterior fossa decompression.  LINES / TUBES: OETT 5/20 >>> OGT 5/20 >>> Foley 5/20 >>> Right IJ CVL 5/20 >>> Left radial A-line 5/20 >>>  CULTURES: None  ANTIBIOTICS: None   HISTORY OF PRESENT ILLNESS:  Pt is encephalopathic; therefore, this HPI is obtained from chart review. Mrs. Schalk is a 58 y.o. F with PMH of Hep C, PNA, HTN, who presented to ED via EMS on 5/20 after she experienced a sudden onset of severe headache.  She then proceeded to fall over and began to have slurred speech.  En route to ED, pt hypertensive 230/120, vomited x 1.  In ED, pt examined by neurology, initially code stroke activated.  Pt taken to CT for head scan which revealed large acute intraparenchymal cerebellar hemorrage with significant mass effect.  She began to get progressively more lethargic and was not protecting her airway so was subsequently intubated by EDP.  No family was available at the time of ED evaluation.  Dr. Conchita Paris of neurosurgery was consulted and he decided to take the pt to the OR for emergent  posterior fossa decompression with implied consent given emergent situation. She returned to the neuro ICU on the vent and PCCM was consulted.  PAST MEDICAL HISTORY :  Past Medical History  Diagnosis Date  . Hepatitis C   . Pneumonia   . Hypertension    Past Surgical History  Procedure Laterality Date  . Neck surgery    . Shoulder surgery Bilateral    Prior to Admission medications   Medication Sig Start Date End Date Taking? Authorizing Provider  acetaminophen-codeine (TYLENOL #3) 300-30 MG per tablet Take 1 tablet by mouth every 4 (four) hours as needed. 05/03/13   Jeanann Lewandowsky, MD  albuterol (PROVENTIL HFA;VENTOLIN HFA) 108 (90 BASE) MCG/ACT inhaler Inhale 2 puffs into the lungs every 6 (six) hours as needed for wheezing. 05/11/13   Jeanann Lewandowsky, MD  FLUoxetine (PROZAC) 20 MG capsule Take 1 capsule (20 mg total) by mouth daily. 03/30/13   Richarda Overlie, MD  hydrOXYzine (ATARAX/VISTARIL) 10 MG tablet Take 1-2 tablets (10-20 mg total) by mouth every 8 (eight) hours as needed for itching. 06/23/13   Richarda Overlie, MD  metoprolol tartrate (LOPRESSOR) 25 MG tablet Take 0.5 tablets (12.5 mg total) by mouth 2 (two) times daily. 03/30/13   Richarda Overlie, MD  nicotine (EQ NICOTINE) 14 mg/24hr patch Place 1 patch (14 mg total) onto the skin daily. 03/30/13   Richarda Overlie, MD  ofloxacin (OCUFLOX) 0.3 % ophthalmic solution Place 1 drop into both eyes 4 (four) times daily. 03/30/13   Richarda Overlie, MD  ondansetron (ZOFRAN ODT) 4 MG  disintegrating tablet Take 1 tablet (4 mg total) by mouth every 8 (eight) hours as needed for nausea or vomiting. 08/03/13   Angelica Chessman, MD  predniSONE (DELTASONE) 20 MG tablet Take 2 tablets (40 mg total) by mouth daily with breakfast. 06/23/13   Reyne Dumas, MD  terbinafine (LAMISIL AT) 1 % cream Apply 1 application topically 2 (two) times daily. 03/30/13   Reyne Dumas, MD  thiamine (VITAMIN B-1) 100 MG tablet Take 1 tablet (100 mg total) by mouth daily.  03/30/13   Reyne Dumas, MD  Vitamin D, Ergocalciferol, (DRISDOL) 50000 UNITS CAPS capsule Take 1 capsule (50,000 Units total) by mouth every 7 (seven) days. 03/31/13   Reyne Dumas, MD  zolpidem (AMBIEN) 5 MG tablet Take 5 mg by mouth at bedtime as needed for sleep.    Historical Provider, MD   Allergies  Allergen Reactions  . Ibuprofen Hives and Other (See Comments)    Burning of the skin.    FAMILY HISTORY:  Family History  Problem Relation Age of Onset  . Diabetes Father   . Diabetes Sister     SOCIAL HISTORY:  reports that she has been smoking Cigarettes.  She has a 80 pack-year smoking history. She has never used smokeless tobacco. She reports that she drinks about 27.6 ounces of alcohol per week. She reports that she uses illicit drugs (Cocaine).   REVIEW OF SYSTEMS:  Unable to complete as pt is encephalopathic.   SUBJECTIVE:  Stable post op.  On vent.  VITAL SIGNS: Temp:  [95.4 F (35.2 C)-95.9 F (35.5 C)] 95.4 F (35.2 C) (05/20 1500) Pulse Rate:  [78-100] 78 (05/20 1851) Resp:  [15-32] 22 (05/20 1851) BP: (143-227)/(93-133) 143/93 mmHg (05/20 1500) SpO2:  [100 %] 100 % (05/20 1851) FiO2 (%):  [60 %-100 %] 100 % (05/20 1851) Weight:  [84.369 kg (186 lb)] 84.369 kg (186 lb) (05/20 1353)  HEMODYNAMICS:    VENTILATOR SETTINGS: Vent Mode:  [-] PRVC FiO2 (%):  [60 %-100 %] 100 % Set Rate:  [14 bmp] 14 bmp Vt Set:  [500 mL] 500 mL PEEP:  [5 cmH20] 5 cmH20 Plateau Pressure:  [16 cmH20] 16 cmH20  INTAKE / OUTPUT: Intake/Output     05/20 0701 - 05/21 0700   I.V. (mL/kg) 2400 (28.4)   Total Intake(mL/kg) 2400 (28.4)   Urine (mL/kg/hr) 2200   Blood 100   Total Output 2300   Net +100         PHYSICAL EXAMINATION: General: WDWN, sedate in bed, in NAD. Neuro: sedated/paralyzed. HEENT: ERRL, sclerae anicteric. Cardiovascular: RRR, no M/R/G.  Lungs: Respirations even and unlabored.  CTA bilaterally, No W/R/R.  Abdomen: BS x 4, soft, NT/ND.   Musculoskeletal: No gross deformities, no edema.  Skin: Intact, warm, no rashes.    LABS:   PULMONARY  Recent Labs Lab 08/18/13 1345  TCO2 21    CBC  Recent Labs Lab 08/18/13 1341 08/18/13 1345  HGB 14.6 16.7*  HCT 42.3 49.0*  WBC 8.2  --   PLT 131*  --     COAGULATION  Recent Labs Lab 08/18/13 1341  INR 1.05    CARDIAC  No results found for this basename: TROPONINI,  in the last 168 hours No results found for this basename: PROBNP,  in the last 168 hours   CHEMISTRY  Recent Labs Lab 08/17/13 1316 08/18/13 1341 08/18/13 1345  NA  --  141 143  K  --  3.7 3.5*  CL  --  105  104  CO2  --  21  --   GLUCOSE  --  174* 175*  BUN  --  10 8  CREATININE 0.90 0.66 0.70  CALCIUM  --  9.2  --    The CrCl is unknown because both a height and weight (above a minimum accepted value) are required for this calculation.   LIVER  Recent Labs Lab 08/18/13 1341  AST 126*  ALT 120*  ALKPHOS 98  BILITOT 1.0  PROT 7.0  ALBUMIN 3.2*  INR 1.05     INFECTIOUS No results found for this basename: LATICACIDVEN, PROCALCITON,  in the last 168 hours   ENDOCRINE CBG (last 3)   Recent Labs  08/18/13 1355  GLUCAP 200*         IMAGING x48h  Ct Head Wo Contrast  08/18/2013   CLINICAL DATA:  Code stroke.  EXAM: CT HEAD WITHOUT CONTRAST  TECHNIQUE: Contiguous axial images were obtained from the base of the skull through the vertex without intravenous contrast.  COMPARISON:  None.  FINDINGS: There is a large acute intraparenchymal hemorrhage in the cerebellum, centered to the right of midline, but also crosses the midline. The hemorrhage measures approximately 4.8 x 4.7 cm axial dimension and causes significant mass effect in the posterior fossa. The fourth ventricle appears to be nearly completely effaced, and there is loss of the normal basilar cisterns. On image number 2, there suggestion of tonsillar herniation. There is a rim of edema surrounding the  hemorrhage. Early hydrocephalus involving the temporal horns of the lateral ventricles is suggested, but no gross/significant hydrocephalus is present at this time. Moderate degree of chronic microvascular ischemic changes are noted in the periventricular and subcortical white matter bilaterally. No supratentorial evidence of acute infarction.  The skull is intact. The visualized paranasal sinuses and mastoid air cells are clear.  IMPRESSION: Large acute intraparenchymal cerebellar hemorrhage with significant mass effect in the posterior fossa and probable tonsillar herniation. Fourth ventricle appears nearly completely effaced. Cannot exclude early hydrocephalus. Critical Value/emergent results were called by telephone at the time of interpretation on 08/18/2013 at 1:59 PM to Dr. Roland Rack , who was already aware of these results.   Electronically Signed   By: Curlene Dolphin M.D.   On: 08/18/2013 14:01   Mr Abdomen W Wo Contrast  08/17/2013   CLINICAL DATA:  Evaluate right renal lesion.  EXAM: MRI ABDOMEN WITHOUT AND WITH CONTRAST  TECHNIQUE: Multiplanar multisequence MR imaging of the abdomen was performed both before and after the administration of intravenous contrast.  CONTRAST:  42mL MULTIHANCE GADOBENATE DIMEGLUMINE 529 MG/ML IV SOLN  COMPARISON:  08/04/2013  FINDINGS: No pleural effusion identified. There is no focal liver abnormality. The gallbladder is normal. Stress set small stone is noted within the gallbladder. The common bile duct is normal in caliber. No intrahepatic bile duct dilatation. Normal appearance of the pancreas. The spleen is unremarkable. Normal appearance of the right adrenal gland. Loss of signal from the left adrenal gland between the in phase and out of phase images noted compatible with a benign adenoma. Small cysts are identified within the inferior pole of the left kidney. Hemorrhagic cyst within the midpole of the right kidney measures 1.7 cm, image 58 of series 1200.  1.7 cm complicated cyst within the inferior pole of the right kidney is identified. No significant enhancement is associated with this structure compatible with a Bosniak category 2 cyst. There is a hemorrhagic cyst within the inferior pole of the left kidney  which measures 5 mm. Bosniak category 1 cysts are also noted within the inferior pole the left kidney. Normal caliber of the abdominal aorta. No aneurysm. No upper abdominal adenopathy.  Review of the visualized osseous structures it is unremarkable.  IMPRESSION: 1. Cysts within the right kidney have imaging and enhancement characteristics compatible of benign Bosniak category 2 cysts. 2. Bosniak category 1 and 2 cysts within the left kidney. 3. Left adrenal adenoma.   Electronically Signed   By: Kerby Moors M.D.   On: 08/17/2013 16:54   Dg Chest Port 1 View  08/18/2013   CLINICAL DATA:  cvl placement  EXAM: PORTABLE CHEST - 1 VIEW  COMPARISON:  DG CHEST 1V PORT dated 08/18/2013  FINDINGS: A right internal jugular catheter has been inserted tip level superior vena cava. There is no pneumothorax. The endotracheal tube and enteric tube are stable. Mild linear density left lung base. Cardiac silhouette within the upper limits of normal. Aorta is tortuous and ectatic concerning atherosclerotic calcifications. No acute osseous abnormalities.  IMPRESSION: Right internal jugular catheter to the level superior vena cava. No pneumothorax.  Minimal atelectasis versus scarring left lung base.  Otherwise stable chest radiograph.   Electronically Signed   By: Margaree Mackintosh M.D.   On: 08/18/2013 21:13   Dg Chest Portable 1 View  08/18/2013   CLINICAL DATA:  Post intubation  EXAM: PORTABLE CHEST - 1 VIEW  COMPARISON:  CT ANGIO CHEST W/CM &/OR WO/CM dated 09/21/2012; DG CHEST 2 VIEW dated 09/21/2012  FINDINGS: Endotracheal tube is positioned 4.5 cm from carina. NG tube extends to the stomach. Normal cardiac silhouette. No effusion, infiltrate, or pneumothorax. Anterior  cervical fusion noted. Benign-appearing chondroid lesion in the left humerus.  IMPRESSION: Endotracheal tube in appropriate position.   Electronically Signed   By: Suzy Bouchard M.D.   On: 08/18/2013 15:16      ASSESSMENT / PLAN:  PULMONARY A:   VDRF P:   - Full mechanical support, wean as able. - Goal SpO2>92, pH>7.30 - VAP bundle. - SBT in AM. - ABG and CXR in AM.  CARDIOVASCULAR A:  Hypertensive Emergency - as high as 230/120 en route to ED. P:  - Cardene gtt to maintain SBP < 160.  RENAL A:   Mild hypokalemia P:   - CMP in AM.  GASTROINTESTINAL A:   NPO GI Px Mildly elevated LFT's. P:   - NPO as intubated. - TF if remains intubated > 24 hours. - SUP:  Pantoprazole. - Trend LFT's.  HEMATOLOGIC A:   No acute issues. P:  - VTE Proph:  SCD's only. - CBC in AM.  INFECTIOUS A:   No acute issues. P:   - Monitor fever curve / WBC's.  ENDOCRINE  A:  Hyperglycemia - no documented hx of DM P:   - CBG's q4hr. - SSI.  NEUROLOGIC A:   ICH - thought to be secondary to hypertension. P:   - Per neurosurgery. - Sedation:  Propofol gtt / Fentanyl PRN. - Goal RASS 0 to -1. - Daily WUA.   Montey Hora, [facility] Pgr: [phone]  or [phone] 19.31 08/18/13   STAFF NOTE: I, Dr Ann Lions have personally reviewed patient's available data, including medical history, events of note, physical examination and test results as part of my evaluation. I have discussed with resident/NP and other care providers such as pharmacist, RN and RRT.  In addition,  I personally  evaluated patient and elicited key findings of acute resp failure due to coma due  To large ICH. Doubt she can survive but will await neuro surg opinion. Full vent support   Rest per NP/medical resident whose note is outlined above and that I agree with  The patient is critically ill with multiple organ systems failure and requires high complexity decision  making for assessment and support, frequent evaluation and titration of therapies, application of advanced monitoring technologies and extensive interpretation of multiple databases.   Critical Care Time devoted to patient care services described in this note is  35  Minutes.  Dr. Brand Males, M.D., [facility].C.P Pulmonary and Critical Care Medicine Staff Physician [facility] Pager: [phone], If no answer or between  15:00h - 7:00h: call [phone]  08/18/2013 9:59 PM

## 2013-08-18 NOTE — ED Notes (Signed)
Pts family given dentures.

## 2013-08-18 NOTE — ED Notes (Signed)
Vital signs stable. 

## 2013-08-18 NOTE — H&P (Signed)
CC:  Altered mental status, "code stroke"  HPI: [name] is a 58 y.o. female seen in the ED after being brought in by EMS. She is currently intubated and unable to provide history. No family is available. History is obtained from neurology/ED personnel. She experienced sudden onset of HA and fell, and then noticed slurred speech. Upon arrival to the ED she was awake, alert with slurred speech and moving all extremities. She subsequently became progressively more somnolent, and was examined by neurology and found to have lost corneal reflexes and was unable to protect airway. She was then intubated.   PMH: Past Medical History  Diagnosis Date  . Hepatitis C   . Pneumonia   . Hypertension     PSH: Past Surgical History  Procedure Laterality Date  . Neck surgery    . Shoulder surgery Bilateral     SH: History  Substance Use Topics  . Smoking status: Current Every Day Smoker -- 2.00 packs/day for 40 years    Types: Cigarettes  . Smokeless tobacco: Never Used  . Alcohol Use: 27.6 oz/week    42 Cans of beer, 4 Shots of liquor per week     Comment: "drinks all day long"    MEDS: Prior to Admission medications   Medication Sig Start Date End Date Taking? Authorizing Provider  acetaminophen-codeine (TYLENOL #3) 300-30 MG per tablet Take 1 tablet by mouth every 4 (four) hours as needed. 05/03/13   Angelica Chessman, MD  albuterol (PROVENTIL HFA;VENTOLIN HFA) 108 (90 BASE) MCG/ACT inhaler Inhale 2 puffs into the lungs every 6 (six) hours as needed for wheezing. 05/11/13   Angelica Chessman, MD  FLUoxetine (PROZAC) 20 MG capsule Take 1 capsule (20 mg total) by mouth daily. 03/30/13   Reyne Dumas, MD  hydrOXYzine (ATARAX/VISTARIL) 10 MG tablet Take 1-2 tablets (10-20 mg total) by mouth every 8 (eight) hours as needed for itching. 06/23/13   Reyne Dumas, MD  metoprolol tartrate (LOPRESSOR) 25 MG tablet Take 0.5 tablets (12.5 mg total) by mouth 2 (two) times daily. 03/30/13   Reyne Dumas, MD  nicotine (EQ NICOTINE) 14 mg/24hr patch Place 1 patch (14 mg total) onto the skin daily. 03/30/13   Reyne Dumas, MD  ofloxacin (OCUFLOX) 0.3 % ophthalmic solution Place 1 drop into both eyes 4 (four) times daily. 03/30/13   Reyne Dumas, MD  ondansetron (ZOFRAN ODT) 4 MG disintegrating tablet Take 1 tablet (4 mg total) by mouth every 8 (eight) hours as needed for nausea or vomiting. 08/03/13   Angelica Chessman, MD  predniSONE (DELTASONE) 20 MG tablet Take 2 tablets (40 mg total) by mouth daily with breakfast. 06/23/13   Reyne Dumas, MD  terbinafine (LAMISIL AT) 1 % cream Apply 1 application topically 2 (two) times daily. 03/30/13   Reyne Dumas, MD  thiamine (VITAMIN B-1) 100 MG tablet Take 1 tablet (100 mg total) by mouth daily. 03/30/13   Reyne Dumas, MD  Vitamin D, Ergocalciferol, (DRISDOL) 50000 UNITS CAPS capsule Take 1 capsule (50,000 Units total) by mouth every 7 (seven) days. 03/31/13   Reyne Dumas, MD  zolpidem (AMBIEN) 5 MG tablet Take 5 mg by mouth at bedtime as needed for sleep.    Historical Provider, MD    ALLERGY: Allergies  Allergen Reactions  . Ibuprofen Hives and Other (See Comments)    Burning of the skin.    ROS: ROS unable to obtain  NEUROLOGIC EXAM: Intubated, paralyzed with rocuronium: unable to obtain neurologic exam Pupils 29mm, sluggish  IMGAING: CT Head with large posterior fossa hematoma with compression of the medulla/pons and effacement of the 4th ventricle.  IMPRESSION: - 58 y.o. female with progressively worsening neurologic exam due to large posterior fossa hemorrhage, likely hypertensive in etiology.  PLAN: - Emergent posterior fossa decompression/evacuation of hematoma  No family is currently available to discuss the patient's current condition and need for treatment. I will therefore proceed with emergent surgery with implied consent.

## 2013-08-18 NOTE — Op Note (Signed)
PREOP DIAGNOSIS:  1. Cerebellar Hematoma 2. Intracranial Hypertension  POSTOP DIAGNOSIS: Same  PROCEDURE: 1. Suboccipital craniectomy 2. Evacuation of cerebellar hematoma 3. Use of intraoperative microscope for microdissection  SURGEON: Dr. Consuella Lose, MD  ASSISTANT: Dr. Kary Kos, MD  ANESTHESIA: General Endotracheal  EBL: 200cc  SPECIMENS: None  DRAINS: None  COMPLICATIONS: None immediate  CONDITION: Hemodynamically stable to ICU  HISTORY: [name] is a 58 y.o. female seen in the emergency department for slurred speech. During her emergency department course, she became progressively obtunded, requiring intubation. CT scan demonstrated a large posterior fossa hematoma with mass effect on the brain stem. With her clinical exam and CT findings, emergent decompression with evacuation of the hematoma was indicated. The risks and benefits of the procedure were explained in detail to the patient's family, and after all their questions were answered, verbal and written consent was obtained and placed in the chart.  PROCEDURE IN DETAIL: After informed consent was obtained and witnessed, the patient was brought to the operating room. After induction of general anesthesia, the Mayfield headholder was applied to the patient and was positioned on the operative table in the prone position. All pressure points were meticulously padded. Skin incision was then marked out in the midline and prepped and draped in the usual sterile fashion.  After timeout was conducted, skin incision was made sharply, and Bovie electrocautery was used to dissect through subcutaneous tissue until the nuchal fascia was identified. This was incised in the midline, and subperiosteal dissection along the occipital bone was carried out. The C1 arch was also identified, and subperiosteal dissection completed. Self-retaining retractors were then placed.  At this point, the microscope was draped sterilely  and brought into the field, and the remainder of the case was done under the microscope using microdissection. Using a high-speed drill, a wide suboccipital craniectomy was completed, and this was enlarged using a combination of rongeurs. The dura was then opened in a V-shaped fashion, and tack up stitches were placed.  The right cerebellar hemisphere was then entered using a combination of bipolar electrocautery and suction. A large hematoma was identified just deep to the surface of the cerebellum, and this was easily removed with suction. The medial, lateral, superior, and inferior borders were identified, and finally the deep border of the hematoma was identified. Once the hematoma was completely evacuated, the cerebellum appeared significantly more relaxed. The cavity was then irrigated with copious amounts of saline irrigation. Hemostasis was then achieved using a combination of morcellized Gelfoam with thrombin, as well as cotton balls with peroxide.  The wound was then irrigated with copious amounts of normal saline irrigation. An inlay dural graft was then placed, the dural leaflets were then reapproximated using 4 Nurolon stitches, and a dural onlay graft was placed. The region was then covered with DuraSeal.  At this point the wound is irrigated with copious amounts of normal saline irrigation. Muscle was then closed using interrupted 0 Vicryl stitches, the fascia with interrupted 0 Vicry stitches, and the subcutaneous layer with interrupted 3-0 Vicryl sutures. The skin was closed using Dermabond. Sterile dressing was then applied. The patient was then transferred to the stretcher, the Mayfield removed, and taken to the intensive care unit in stable hemodynamic condition.  At the end of the case all sponge, needle, and instrument counts were correct.

## 2013-08-18 NOTE — ED Provider Notes (Signed)
CSN: [id number]     Arrival date & time 08/18/13  1334 History   First MD Initiated Contact with Patient 08/18/13 1416     No chief complaint on file.   An emergency department physician performed an initial assessment on this suspected stroke patient at 1336. (Consider location/radiation/quality/duration/timing/severity/associated sxs/prior Treatment) The history is provided by the patient and the EMS personnel. The history is limited by the condition of the patient.   56 six-year-old female brought in as a code stroke by EMS. Patient was normal until 1:00 in the afternoon. Patient brought in from home. Patient developed slurred speech. Patient was taken immediately to CT scan. Patient was protecting her airway upon arrival. Patient returned from CT scan she was unresponsive so additional history not obtainable. Level V caveat applies. Past Medical History  Diagnosis Date  . Hepatitis C   . Pneumonia   . Hypertension    Past Surgical History  Procedure Laterality Date  . Neck surgery    . Shoulder surgery Bilateral    Family History  Problem Relation Age of Onset  . Diabetes Father   . Diabetes Sister    History  Substance Use Topics  . Smoking status: Current Every Day Smoker -- 2.00 packs/day for 40 years    Types: Cigarettes  . Smokeless tobacco: Never Used  . Alcohol Use: 27.6 oz/week    42 Cans of beer, 4 Shots of liquor per week     Comment: "drinks all day long"   OB History   Grav Para Term Preterm Abortions TAB SAB Ect Mult Living                 Review of Systems  Unable to perform ROS  to her altered mental status level V caveat.    Allergies  Ibuprofen  Home Medications   Prior to Admission medications   Medication Sig Start Date End Date Taking? Authorizing Provider  acetaminophen-codeine (TYLENOL #3) 300-30 MG per tablet Take 1 tablet by mouth every 4 (four) hours as needed. 05/03/13   Angelica Chessman, MD  albuterol (PROVENTIL HFA;VENTOLIN HFA)  108 (90 BASE) MCG/ACT inhaler Inhale 2 puffs into the lungs every 6 (six) hours as needed for wheezing. 05/11/13   Angelica Chessman, MD  FLUoxetine (PROZAC) 20 MG capsule Take 1 capsule (20 mg total) by mouth daily. 03/30/13   Reyne Dumas, MD  hydrOXYzine (ATARAX/VISTARIL) 10 MG tablet Take 1-2 tablets (10-20 mg total) by mouth every 8 (eight) hours as needed for itching. 06/23/13   Reyne Dumas, MD  metoprolol tartrate (LOPRESSOR) 25 MG tablet Take 0.5 tablets (12.5 mg total) by mouth 2 (two) times daily. 03/30/13   Reyne Dumas, MD  nicotine (EQ NICOTINE) 14 mg/24hr patch Place 1 patch (14 mg total) onto the skin daily. 03/30/13   Reyne Dumas, MD  ofloxacin (OCUFLOX) 0.3 % ophthalmic solution Place 1 drop into both eyes 4 (four) times daily. 03/30/13   Reyne Dumas, MD  ondansetron (ZOFRAN ODT) 4 MG disintegrating tablet Take 1 tablet (4 mg total) by mouth every 8 (eight) hours as needed for nausea or vomiting. 08/03/13   Angelica Chessman, MD  predniSONE (DELTASONE) 20 MG tablet Take 2 tablets (40 mg total) by mouth daily with breakfast. 06/23/13   Reyne Dumas, MD  terbinafine (LAMISIL AT) 1 % cream Apply 1 application topically 2 (two) times daily. 03/30/13   Reyne Dumas, MD  thiamine (VITAMIN B-1) 100 MG tablet Take 1 tablet (100 mg total) by mouth daily. 03/30/13  Reyne Dumas, MD  Vitamin D, Ergocalciferol, (DRISDOL) 50000 UNITS CAPS capsule Take 1 capsule (50,000 Units total) by mouth every 7 (seven) days. 03/31/13   Reyne Dumas, MD  zolpidem (AMBIEN) 5 MG tablet Take 5 mg by mouth at bedtime as needed for sleep.    Historical Provider, MD   BP 227/133  Pulse 96  Resp 25  Wt 186 lb (84.369 kg)  SpO2 100% Physical Exam  Nursing note and vitals reviewed. Constitutional: She appears well-developed and well-nourished. She appears distressed.  HENT:  Head: Normocephalic and atraumatic.  Cardiovascular: Normal rate, regular rhythm and normal heart sounds.   No murmur  heard. Pulmonary/Chest: Effort normal and breath sounds normal. No respiratory distress.  Abdominal: Soft. She exhibits distension. There is no tenderness.  Musculoskeletal: Normal range of motion. She exhibits no edema.  Neurological:  Awake with slurred speech. Using her right side purposefully. Patient returned from head CT she was unresponsive.  Skin: No rash noted.    ED Course  Procedures (including critical care time) Labs Review Labs Reviewed  ETHANOL - Abnormal; Notable for the following:    Alcohol, Ethyl (B) 23 (*)    All other components within normal limits  CBC - Abnormal; Notable for the following:    MCV 106.0 (*)    MCH 36.6 (*)    Platelets 131 (*)    All other components within normal limits  DIFFERENTIAL - Abnormal; Notable for the following:    Neutrophils Relative % 32 (*)    Lymphocytes Relative 58 (*)    Lymphs Abs 4.6 (*)    All other components within normal limits  COMPREHENSIVE METABOLIC PANEL - Abnormal; Notable for the following:    Glucose, Bld 174 (*)    Albumin 3.2 (*)    AST 126 (*)    ALT 120 (*)    All other components within normal limits  I-STAT CHEM 8, ED - Abnormal; Notable for the following:    Potassium 3.5 (*)    Glucose, Bld 175 (*)    Hemoglobin 16.7 (*)    HCT 49.0 (*)    All other components within normal limits  CBG MONITORING, ED - Abnormal; Notable for the following:    Glucose-Capillary 200 (*)    All other components within normal limits  PROTIME-INR  APTT  URINE RAPID DRUG SCREEN (HOSP PERFORMED)  URINALYSIS, ROUTINE W REFLEX MICROSCOPIC  I-STAT TROPOININ, ED  I-STAT TROPOININ, ED   Results for orders placed during the hospital encounter of 08/18/13  ETHANOL      Result Value Ref Range   Alcohol, Ethyl (B) 23 (*) 0 - 11 mg/dL  PROTIME-INR      Result Value Ref Range   Prothrombin Time 13.5  11.6 - 15.2 seconds   INR 1.05  0.00 - 1.49  APTT      Result Value Ref Range   aPTT 29  24 - 37 seconds  CBC       Result Value Ref Range   WBC 8.2  4.0 - 10.5 K/uL   RBC 3.99  3.87 - 5.11 MIL/uL   Hemoglobin 14.6  12.0 - 15.0 g/dL   HCT 42.3  36.0 - 46.0 %   MCV 106.0 (*) 78.0 - 100.0 fL   MCH 36.6 (*) 26.0 - 34.0 pg   MCHC 34.5  30.0 - 36.0 g/dL   RDW 12.6  11.5 - 15.5 %   Platelets 131 (*) 150 - 400 K/uL  DIFFERENTIAL  Result Value Ref Range   Neutrophils Relative % 32 (*) 43 - 77 %   Neutro Abs 2.6  1.7 - 7.7 K/uL   Lymphocytes Relative 58 (*) 12 - 46 %   Lymphs Abs 4.6 (*) 0.7 - 4.0 K/uL   Monocytes Relative 9  3 - 12 %   Monocytes Absolute 0.8  0.1 - 1.0 K/uL   Eosinophils Relative 1  0 - 5 %   Eosinophils Absolute 0.1  0.0 - 0.7 K/uL   Basophils Relative 0  0 - 1 %   Basophils Absolute 0.0  0.0 - 0.1 K/uL  COMPREHENSIVE METABOLIC PANEL      Result Value Ref Range   Sodium 141  137 - 147 mEq/L   Potassium 3.7  3.7 - 5.3 mEq/L   Chloride 105  96 - 112 mEq/L   CO2 21  19 - 32 mEq/L   Glucose, Bld 174 (*) 70 - 99 mg/dL   BUN 10  6 - 23 mg/dL   Creatinine, Ser 0.66  0.50 - 1.10 mg/dL   Calcium 9.2  8.4 - 10.5 mg/dL   Total Protein 7.0  6.0 - 8.3 g/dL   Albumin 3.2 (*) 3.5 - 5.2 g/dL   AST 126 (*) 0 - 37 U/L   ALT 120 (*) 0 - 35 U/L   Alkaline Phosphatase 98  39 - 117 U/L   Total Bilirubin 1.0  0.3 - 1.2 mg/dL   GFR calc non Af Amer >90  >90 mL/min   GFR calc Af Amer >90  >90 mL/min  I-STAT CHEM 8, ED      Result Value Ref Range   Sodium 143  137 - 147 mEq/L   Potassium 3.5 (*) 3.7 - 5.3 mEq/L   Chloride 104  96 - 112 mEq/L   BUN 8  6 - 23 mg/dL   Creatinine, Ser 0.70  0.50 - 1.10 mg/dL   Glucose, Bld 175 (*) 70 - 99 mg/dL   Calcium, Ion 1.18  1.12 - 1.23 mmol/L   TCO2 21  0 - 100 mmol/L   Hemoglobin 16.7 (*) 12.0 - 15.0 g/dL   HCT 49.0 (*) 36.0 - 46.0 %  I-STAT TROPOININ, ED      Result Value Ref Range   Troponin i, poc 0.02  0.00 - 0.08 ng/mL   Comment 3           CBG MONITORING, ED      Result Value Ref Range   Glucose-Capillary 200 (*) 70 - 99 mg/dL      Imaging Review Ct Head Wo Contrast  08/18/2013   CLINICAL DATA:  Code stroke.  EXAM: CT HEAD WITHOUT CONTRAST  TECHNIQUE: Contiguous axial images were obtained from the base of the skull through the vertex without intravenous contrast.  COMPARISON:  None.  FINDINGS: There is a large acute intraparenchymal hemorrhage in the cerebellum, centered to the right of midline, but also crosses the midline. The hemorrhage measures approximately 4.8 x 4.7 cm axial dimension and causes significant mass effect in the posterior fossa. The fourth ventricle appears to be nearly completely effaced, and there is loss of the normal basilar cisterns. On image number 2, there suggestion of tonsillar herniation. There is a rim of edema surrounding the hemorrhage. Early hydrocephalus involving the temporal horns of the lateral ventricles is suggested, but no gross/significant hydrocephalus is present at this time. Moderate degree of chronic microvascular ischemic changes are noted in the periventricular and subcortical white matter  bilaterally. No supratentorial evidence of acute infarction.  The skull is intact. The visualized paranasal sinuses and mastoid air cells are clear.  IMPRESSION: Large acute intraparenchymal cerebellar hemorrhage with significant mass effect in the posterior fossa and probable tonsillar herniation. Fourth ventricle appears nearly completely effaced. Cannot exclude early hydrocephalus. Critical Value/emergent results were called by telephone at the time of interpretation on 08/18/2013 at 1:59 PM to Dr. Roland Rack , who was already aware of these results.   Electronically Signed   By: Curlene Dolphin M.D.   On: 08/18/2013 14:01   Mr Abdomen W Wo Contrast  08/17/2013   CLINICAL DATA:  Evaluate right renal lesion.  EXAM: MRI ABDOMEN WITHOUT AND WITH CONTRAST  TECHNIQUE: Multiplanar multisequence MR imaging of the abdomen was performed both before and after the administration of intravenous  contrast.  CONTRAST:  42mL MULTIHANCE GADOBENATE DIMEGLUMINE 529 MG/ML IV SOLN  COMPARISON:  08/04/2013  FINDINGS: No pleural effusion identified. There is no focal liver abnormality. The gallbladder is normal. Stress set small stone is noted within the gallbladder. The common bile duct is normal in caliber. No intrahepatic bile duct dilatation. Normal appearance of the pancreas. The spleen is unremarkable. Normal appearance of the right adrenal gland. Loss of signal from the left adrenal gland between the in phase and out of phase images noted compatible with a benign adenoma. Small cysts are identified within the inferior pole of the left kidney. Hemorrhagic cyst within the midpole of the right kidney measures 1.7 cm, image 58 of series 1200. 1.7 cm complicated cyst within the inferior pole of the right kidney is identified. No significant enhancement is associated with this structure compatible with a Bosniak category 2 cyst. There is a hemorrhagic cyst within the inferior pole of the left kidney which measures 5 mm. Bosniak category 1 cysts are also noted within the inferior pole the left kidney. Normal caliber of the abdominal aorta. No aneurysm. No upper abdominal adenopathy.  Review of the visualized osseous structures it is unremarkable.  IMPRESSION: 1. Cysts within the right kidney have imaging and enhancement characteristics compatible of benign Bosniak category 2 cysts. 2. Bosniak category 1 and 2 cysts within the left kidney. 3. Left adrenal adenoma.   Electronically Signed   By: Kerby Moors M.D.   On: 08/17/2013 16:54     EKG Interpretation None      INTUBATION Performed by: Mervin Kung  Required items: required blood products, implants, devices, and special equipment available Patient identity confirmed: provided demographic data and hospital-assigned identification number Time out: Immediately prior to procedure a "time out" was called to verify the correct patient, procedure,  equipment, support staff and site/side marked as required.  Indications: Altered mental status mass of head bleed   Intubation method: Glidescope Laryngoscopy   Preoxygenation: 100% oxygen BVM  Sedatives: 20 mg Etomidate Paralytic: 80 mg Rocunorium Tube Size: 7.5 mm cuffed  Post-procedure assessment: chest rise and ETCO2 monitor Breath sounds: equal and absent over the epigastrium Tube secured with: ETT holder Chest x-ray interpreted by radiologist and me.  Chest x-ray findings: endotracheal tube in appropriate position  Patient tolerated the procedure well with no immediate complications.     CRITICAL CARE Performed by: Mervin Kung Total critical care time: 30 Critical care time was exclusive of separately billable procedures and treating other patients. Critical care was necessary to treat or prevent imminent or life-threatening deterioration. Critical care was time spent personally by me on the following activities: development of  treatment plan with patient and/or surrogate as well as nursing, discussions with consultants, evaluation of patient's response to treatment, examination of patient, obtaining history from patient or surrogate, ordering and performing treatments and interventions, ordering and review of laboratory studies, ordering and review of radiographic studies, pulse oximetry and re-evaluation of patient's condition.     MDM   Final diagnoses:  Hemorrhagic cerebellum  Hypertensive crisis    Patient came in by EMS as a code stroke. Patient upon arrival to the bridge had some slurred speech was able to follow commands and was using her right side appropriately. Taken immediately to CT scan. Stroke team escorted her.  CT scan showed massive cerebellum bleed. Patient came back from CT scan now with decreased mental status not as responsive.  Patient required immediate intubation. Following intubation we held off on additional sedation meds until  neurosurgery could see her. Neurosurgery came down and evaluated. The meantime she received mannitol and Cardene. To help control her significant hypertension. After neurosurgery evaluated and decided to take her to the operating room started on propofol drip.  Patient's past medical history was consistent of hypertension. Also remote history of cocaine use and heavy alcohol use.    Mervin Kung, MD 08/18/13 [phone]

## 2013-08-18 NOTE — Progress Notes (Signed)
Chaplain received referral in the ED that family may need chaplain support when they arrive. RN said family had been contacted. Please page if they need support when they arrive.   Ethelene Browns [phone]

## 2013-08-18 NOTE — Anesthesia Postprocedure Evaluation (Signed)
  Anesthesia Post-op Note  Patient: [name]  Procedure(s) Performed: Procedure(s): CRANIECTOMY POSTERIOR FOSSA DECOMPRESSION, EVACUATION OF HEMORRHAGE (N/A)  Patient Location: ICU  Anesthesia Type:General  Level of Consciousness: sedated and Patient remains intubated per anesthesia plan  Airway and Oxygen Therapy: Patient remains intubated per anesthesia plan  Post-op Pain: sedated, no complaint possible  Post-op Assessment: Post-op Vital signs reviewed  Post-op Vital Signs: Reviewed  Last Vitals:  Filed Vitals:   08/18/13 1851  BP:   Pulse: 78  Temp:   Resp: 22    Complications: No apparent anesthesia complications

## 2013-08-18 NOTE — Code Documentation (Signed)
58yo female arriving to [facility] at 71 via [place].  Code stroke called by EMS d/t slurred speech and left facial droop. Patient hypertensive en route with a BP of 230/120 and vomited x1.  Patient got out of the shower and told her roommate that she did not feel well and was going to lay down to rest.  She reported the "worst HA of her life."  Patient cleared at the bridge for CT and taken to CT on arrival.  CT showing cerebellar ICH.  Patient becoming less responsive and taken back to Trauma C for emergent intubation requiring paralytics.  Full NIHSS unable to be assessed due to clinical instability.  Patient's exam positive for left facial droop, slurred speech and decreased LOC prior to intubation.  Patient given Mannitol 50gm IVP per Dr. Leonel Ramsay.  Cardene gtt started for BP management.  Neurosurgery consulted and patient to go to OR.  Bedside handoff with ED RN Lexine Baton.

## 2013-08-18 NOTE — Transfer of Care (Signed)
Immediate Anesthesia Transfer of Care Note  Patient: [name]  Procedure(s) Performed: Procedure(s): CRANIECTOMY POSTERIOR FOSSA DECOMPRESSION, EVACUATION OF HEMORRHAGE (N/A)  Patient Location: PACU and NICU  Anesthesia Type:General  Level of Consciousness: unresponsive  Airway & Oxygen Therapy: Patient remains intubated per anesthesia plan  Post-op Assessment: Post -op Vital signs reviewed and stable  Post vital signs: Reviewed and stable  Complications: No apparent anesthesia complications

## 2013-08-18 NOTE — ED Notes (Signed)
Family at bedside. 

## 2013-08-18 NOTE — ED Notes (Signed)
Pt arrives via EMS from home with sudden onset of HA, left facial droop, slurred speech, worst HA of her life. Hypertensive with systolic Bp in 801K for EMS. 18g LAC. Taken to CT after cleared by EDP on arrival to bridge.

## 2013-08-19 ENCOUNTER — Inpatient Hospital Stay (HOSPITAL_COMMUNITY): Payer: Medicaid Other

## 2013-08-19 ENCOUNTER — Telehealth: Payer: Self-pay | Admitting: Emergency Medicine

## 2013-08-19 DIAGNOSIS — G936 Cerebral edema: Secondary | ICD-10-CM

## 2013-08-19 DIAGNOSIS — G935 Compression of brain: Secondary | ICD-10-CM

## 2013-08-19 LAB — COMPREHENSIVE METABOLIC PANEL
ALBUMIN: 2.9 g/dL — AB (ref 3.5–5.2)
ALT: 110 U/L — ABNORMAL HIGH (ref 0–35)
AST: 117 U/L — AB (ref 0–37)
Alkaline Phosphatase: 85 U/L (ref 39–117)
BILIRUBIN TOTAL: 1.5 mg/dL — AB (ref 0.3–1.2)
BUN: 8 mg/dL (ref 6–23)
CALCIUM: 8.6 mg/dL (ref 8.4–10.5)
CHLORIDE: 105 meq/L (ref 96–112)
CO2: 24 mEq/L (ref 19–32)
Creatinine, Ser: 0.55 mg/dL (ref 0.50–1.10)
GFR calc Af Amer: >90 mL/min (ref >90)
GFR calc non Af Amer: >90 mL/min (ref >90)
Glucose, Bld: 158 mg/dL — ABNORMAL HIGH (ref 70–99)
Potassium: 4 mEq/L (ref 3.7–5.3)
Sodium: 140 mEq/L (ref 137–147)
Total Protein: 6.4 g/dL (ref 6.0–8.3)

## 2013-08-19 LAB — CBC
HCT: 41.7 % (ref 36.0–46.0)
Hemoglobin: 14 g/dL (ref 12.0–15.0)
MCH: 36.1 pg — ABNORMAL HIGH (ref 26.0–34.0)
MCHC: 33.6 g/dL (ref 30.0–36.0)
MCV: 107.5 fL — AB (ref 78.0–100.0)
Platelets: 104 10*3/uL — ABNORMAL LOW (ref 150–400)
RBC: 3.88 MIL/uL (ref 3.87–5.11)
RDW: 13.1 % (ref 11.5–15.5)
WBC: 8.9 10*3/uL (ref 4.0–10.5)

## 2013-08-19 LAB — BLOOD GAS, ARTERIAL
Acid-Base Excess: 0.2 mmol/L (ref 0.0–2.0)
BICARBONATE: 24.4 meq/L — AB (ref 20.0–24.0)
Drawn by: 36496
FIO2: 40 %
MECHVT: 500 mL
O2 Saturation: 98.3 %
PCO2 ART: 40.5 mmHg (ref 35.0–45.0)
PEEP/CPAP: 5 cmH2O
Patient temperature: 98.6
RATE: 14 resp/min
TCO2: 25.7 mmol/L (ref 0–100)
pH, Arterial: 7.398 (ref 7.350–7.450)
pO2, Arterial: 119 mmHg — ABNORMAL HIGH (ref 80.0–100.0)

## 2013-08-19 LAB — GLUCOSE, CAPILLARY
GLUCOSE-CAPILLARY: 167 mg/dL — AB (ref 70–99)
Glucose-Capillary: 177 mg/dL — ABNORMAL HIGH (ref 70–99)

## 2013-08-19 LAB — MAGNESIUM: Magnesium: 1.7 mg/dL (ref 1.5–2.5)

## 2013-08-19 LAB — PHOSPHORUS: Phosphorus: 3.4 mg/dL (ref 2.3–4.6)

## 2013-08-19 MED ORDER — OFLOXACIN 0.3 % OP SOLN
1.0000 [drp] | Freq: Four times a day (QID) | OPHTHALMIC | Status: DC
  Administered 2013-08-19 – 2013-08-26 (×30): 1 [drp] via OPHTHALMIC
  Filled 2013-08-19: qty 5

## 2013-08-19 MED ORDER — HYDRALAZINE HCL 20 MG/ML IJ SOLN
10.0000 mg | INTRAMUSCULAR | Status: DC | PRN
  Administered 2013-08-22: 10 mg via INTRAVENOUS
  Filled 2013-08-19: qty 1

## 2013-08-19 MED ORDER — METOPROLOL TARTRATE 1 MG/ML IV SOLN
5.0000 mg | Freq: Four times a day (QID) | INTRAVENOUS | Status: DC
  Administered 2013-08-19 – 2013-08-21 (×7): 5 mg via INTRAVENOUS
  Filled 2013-08-19 (×12): qty 5

## 2013-08-19 MED ORDER — PREDNISONE 20 MG PO TABS
40.0000 mg | ORAL_TABLET | Freq: Every day | ORAL | Status: DC
  Administered 2013-08-19 – 2013-08-21 (×2): 40 mg via ORAL
  Filled 2013-08-19 (×4): qty 2

## 2013-08-19 MED ORDER — LABETALOL HCL 200 MG PO TABS
200.0000 mg | ORAL_TABLET | Freq: Two times a day (BID) | ORAL | Status: DC
  Filled 2013-08-19 (×2): qty 1

## 2013-08-19 MED ORDER — BIOTENE DRY MOUTH MT LIQD
15.0000 mL | Freq: Four times a day (QID) | OROMUCOSAL | Status: DC
  Administered 2013-08-19 (×2): 15 mL via OROMUCOSAL

## 2013-08-19 MED ORDER — METOPROLOL TARTRATE 12.5 MG HALF TABLET
12.5000 mg | ORAL_TABLET | Freq: Two times a day (BID) | ORAL | Status: DC
  Filled 2013-08-19 (×2): qty 1

## 2013-08-19 MED ORDER — INSULIN ASPART 100 UNIT/ML ~~LOC~~ SOLN
0.0000 [IU] | SUBCUTANEOUS | Status: DC
  Administered 2013-08-19 (×3): 3 [IU] via SUBCUTANEOUS
  Administered 2013-08-22 (×3): 2 [IU] via SUBCUTANEOUS
  Administered 2013-08-23 (×3): 3 [IU] via SUBCUTANEOUS
  Administered 2013-08-24 (×4): 2 [IU] via SUBCUTANEOUS
  Administered 2013-08-24 – 2013-08-25 (×2): 3 [IU] via SUBCUTANEOUS
  Administered 2013-08-25 (×2): 2 [IU] via SUBCUTANEOUS
  Administered 2013-08-26 (×2): 3 [IU] via SUBCUTANEOUS

## 2013-08-19 MED ORDER — NICOTINE 14 MG/24HR TD PT24
14.0000 mg | MEDICATED_PATCH | Freq: Every day | TRANSDERMAL | Status: DC
  Administered 2013-08-19 – 2013-08-25 (×7): 14 mg via TRANSDERMAL
  Filled 2013-08-19 (×9): qty 1

## 2013-08-19 MED ORDER — CHLORHEXIDINE GLUCONATE 0.12 % MT SOLN
15.0000 mL | Freq: Two times a day (BID) | OROMUCOSAL | Status: DC
  Administered 2013-08-19: 15 mL via OROMUCOSAL
  Filled 2013-08-19: qty 15

## 2013-08-19 NOTE — Progress Notes (Signed)
Stroke Team Progress Note  HISTORY [name] is an 58 y.o. female who took a shower and then suddenly had a sever HA (worst HA of her life) then fell down on 08/18/2013 at 1300. There is question of loss consciousness. EMS was called and patient was transported to ER as Code Stroke. While in route she had emisis and SBP was 230. CT head showed a large cerebellar bleed. Patient was brought back to ER, intubated and Neurosurgery was consulted for possible immediate evacuation. Patient was given 5 grams mannitol and Cardiene was started to being BP down. Neurosurgery arrived and agreed to take patient for posterior decompression. Patient was not administered TPA secondary to hemorrhage. She was admitted post op to the neuro ICU for further evaluation and treatment.  SUBJECTIVE Her RN is at the bedside, no family present.  Overall she feels her condition is unchanged.   OBJECTIVE Most recent Vital Signs: Filed Vitals:   08/19/13 0500 08/19/13 0600 08/19/13 0700 08/19/13 0725  BP: 136/89 143/88 144/87   Pulse: 99 100 100 102  Temp:      TempSrc:      Resp: 14 14 14 15   Height:      Weight: 85.8 kg (189 lb 2.5 oz)     SpO2: 100% 100% 100% 100%   CBG (last 3)   Recent Labs  08/18/13 1355  GLUCAP 200*    IV Fluid Intake:   . sodium chloride 75 mL/hr at 08/18/13 2112  . niCARDipine 3 mg/hr (08/19/13 2440)  . propofol 25 mcg/kg/min (08/19/13 0404)    MEDICATIONS  . antiseptic oral rinse  15 mL Mouth Rinse QID  . chlorhexidine  15 mL Mouth Rinse BID  . mannitol  25 g Intravenous Once  . pantoprazole (PROTONIX) IV  40 mg Intravenous QHS  . senna-docusate  1 tablet Oral BID   PRN:  sodium chloride, acetaminophen, acetaminophen, fentaNYL, labetalol, morphine injection  Diet:  NPO  Activity:  Bedrest DVT Prophylaxis:  SCDs   CLINICALLY SIGNIFICANT STUDIES Basic Metabolic Panel:   Recent Labs Lab 08/18/13 1341 08/18/13 1345 08/19/13 0500  NA 141 143 140  K 3.7 3.5* 4.0   CL 105 104 105  CO2 21  --  24  GLUCOSE 174* 175* 158*  BUN 10 8 8   CREATININE 0.66 0.70 0.55  CALCIUM 9.2  --  8.6  MG  --   --  1.7  PHOS  --   --  3.4   Liver Function Tests:   Recent Labs Lab 08/18/13 1341 08/19/13 0500  AST 126* 117*  ALT 120* 110*  ALKPHOS 98 85  BILITOT 1.0 1.5*  PROT 7.0 6.4  ALBUMIN 3.2* 2.9*   CBC:   Recent Labs Lab 08/18/13 1341 08/18/13 1345 08/19/13 0500  WBC 8.2  --  8.9  NEUTROABS 2.6  --   --   HGB 14.6 16.7* 14.0  HCT 42.3 49.0* 41.7  MCV 106.0*  --  107.5*  PLT 131*  --  104*   Coagulation:   Recent Labs Lab 08/18/13 1341  LABPROT 13.5  INR 1.05   Cardiac Enzymes: No results found for this basename: CKTOTAL, CKMB, CKMBINDEX, TROPONINI,  in the last 168 hours Urinalysis:   Recent Labs Lab 08/18/13 1410  COLORURINE YELLOW  LABSPEC 1.012  PHURINE 7.0  GLUCOSEU 100*  HGBUR NEGATIVE  BILIRUBINUR NEGATIVE  KETONESUR NEGATIVE  PROTEINUR NEGATIVE  UROBILINOGEN 1.0  NITRITE NEGATIVE  LEUKOCYTESUR NEGATIVE   Lipid Panel  Component Value Date/Time   CHOL 114 03/30/2013 1624   TRIG 84 08/18/2013 1931   HDL 39* 03/30/2013 1624   CHOLHDL 2.9 03/30/2013 1624   VLDL 17 03/30/2013 1624   LDLCALC 58 03/30/2013 1624   HgbA1C  Lab Results  Component Value Date   HGBA1C 5.0 03/30/2013    Urine Drug Screen:     Component Value Date/Time   LABOPIA NONE DETECTED 08/18/2013 1410   COCAINSCRNUR POSITIVE* 08/18/2013 1410   LABBENZ NONE DETECTED 08/18/2013 1410   AMPHETMU NONE DETECTED 08/18/2013 1410   THCU NONE DETECTED 08/18/2013 1410   LABBARB NONE DETECTED 08/18/2013 1410    Alcohol Level:   Recent Labs Lab 08/18/13 1341  ETH 23*    Mr Abdomen W Wo Contrast  08/17/2013   1. Cysts within the right kidney have imaging and enhancement characteristics compatible of benign Bosniak category 2 cysts. 2. Bosniak category 1 and 2 cysts within the left kidney. 3. Left adrenal adenoma.      CT of the brain  08/18/2013     Large acute intraparenchymal cerebellar hemorrhage with significant mass effect in the posterior fossa and probable tonsillar herniation. Fourth ventricle appears nearly completely effaced. Cannot exclude early hydrocephalus.   MRI of the brain    MRA of the brain    2D Echocardiogram    Carotid Doppler    CXR   08/18/2013    Right internal jugular catheter to the level superior vena cava. No pneumothorax.  Minimal atelectasis versus scarring left lung base.  Otherwise stable chest radiograph.    08/18/2013    Endotracheal tube in appropriate position.   Electronically Signed   By: Suzy Bouchard M.D.   On: 08/18/2013 15:16   EKG  normal sinus rhythm. For complete results please see formal report.   Therapy Recommendations   Physical Exam   Middle aged lady intubated. . Afebrile. Head is nontraumatic. Neck is supple without bruit.    Cardiac exam no murmur or gallop. Lungs are clear to auscultation. Distal pulses are well felt. Neurological Exam : Drowsy but opens eyes to commands. Follows gaze in all directions. No nystagmus. Blinks to threat bilaterally. Fundi were not visualized. Vision acuity cannot be reliably tested. Follows commands consistently in all 4 extremities. Good antigravity strength on the right. Able to move left side but mild weakness compared to the right. Deep tendon reflexes symmetric. Plantars are downgoing. ASSESSMENT [name] is a 58 y.o. female presenting with with worse headache of her life. Imaging confirms a large cerebellar hemorrhage with mass effect and tonsillar herniation, 4th vertical effaced. infarct. Status post decompressive craniotomy per Dr. Kathyrn Sheriff. Hemorrhage secondary to malginant hypertension as a result of cocaine use with SBP > 230 on arrival. On no antithrombotics prior to admission. Patient with resultant VDRF. Stroke work up underway.   Malignant hypertension  cardene drip  Hepatitis C  Cigarette smoker  etoh use,  positive on admission  Cocaine use, positive on admission  Hospital day # 1  TREATMENT/PLAN  CT head this am, do prior to extubaton. If significant edema, will want to delay extubation  SBP goal < 160 x 24h, then < 180  Resume home BP medications   Stop NG suction for meds  Discussed with Dr. Orson Slick and PCCM M.D.   Burnetta Sabin, MSN, RN, ANVP-BC, AGPCNP-BC [facility] Pager: [phone] 08/19/2013 8:26 AM This patient is critically ill and at significant risk of neurological worsening, death and care  requires constant monitoring of vital signs, hemodynamics,respiratory and cardiac monitoring,review of multiple databases, neurological assessment, discussion with family, other specialists and medical decision making of high complexity. I spent 30 minutes of neurocritical care time  in the care of  this patient. I have personally obtained a history, examined the patient, evaluated imaging results, and formulated the assessment and plan of care. I agree with the above.  Antony Contras, MD  To contact Stroke Continuity provider, please refer to [email or web address] After hours, contact General Neurology

## 2013-08-19 NOTE — Progress Notes (Signed)
PULMONARY / CRITICAL CARE MEDICINE  Name: [name] MRN: [id number] DOB: November 17, 1955    ADMISSION DATE:  08/18/2013 CONSULTATION DATE:  08/18/2013  REFERRING MD :  Kathyrn Sheriff PRIMARY SERVICE:  PCCM  CHIEF COMPLAINT:  ICH s/p emergent posterior fossa decompression  BRIEF PATIENT DESCRIPTION: 58 yo with HTN brought to ED with sudden onset severe headache and was intubated for airway protection. CT revealed a large acute intraparenchymal cerebellar hemorrage with significant mass effect in posterior fossa.  Was taken to the OR emergently for a posterior fossa decompression. PCCM was consulted post op.  SIGNIFICANT EVENTS / STUDIES:  CT head 5/20 >>> large acute intraparenchymal cerebellar hemorrage with significant mass effect in posterior fossa, 4th ventricle almost fully effaced OR 5/20 >>> emergent posterior fossa decompression  LINES / TUBES: OETT 5/20 >>> OGT 5/20 >>> Foley 5/20 >>> Right IJ CVL 5/20 >>> Left radial A-line 5/20 >>>  CULTURES:  ANTIBIOTICS:  INTERVAL HISTORY:  No acute interval events  VITAL SIGNS: Temp:  [95.4 F (35.2 C)-98.6 F (37 C)] 98.6 F (37 C) (05/21 0725) Pulse Rate:  [78-104] 104 (05/21 0900) Resp:  [14-32] 15 (05/21 0900) BP: (126-227)/(77-133) 128/80 mmHg (05/21 0900) SpO2:  [100 %] 100 % (05/21 0900) Arterial Line BP: (163-187)/(85-102) 178/85 mmHg (05/21 0900) FiO2 (%):  [40 %-100 %] 40 % (05/21 0900) Weight:  [84.369 kg (186 lb)-85.8 kg (189 lb 2.5 oz)] 85.8 kg (189 lb 2.5 oz) (05/21 0500)  HEMODYNAMICS:    VENTILATOR SETTINGS: Vent Mode:  [-] PRVC FiO2 (%):  [40 %-100 %] 40 % Set Rate:  [14 bmp] 14 bmp Vt Set:  [500 mL] 500 mL PEEP:  [5 cmH20] 5 cmH20 Plateau Pressure:  [15 cmH20-18 cmH20] 17 cmH20  INTAKE / OUTPUT: Intake/Output     05/20 0701 - 05/21 0700 05/21 0701 - 05/22 0700   I.V. (mL/kg) 3524.7 (41.1) 338 (3.9)   Total Intake(mL/kg) 3524.7 (41.1) 338 (3.9)   Urine (mL/kg/hr) 3600    Emesis/NG output 200    Blood 100    Total Output 3900     Net -375.3 +338         PHYSICAL EXAMINATION: General: No distress, good spontaneous volumes when on SBT Neuro: Awake, following commands HEENT: PERRL Cardiovascular: Regular, no distress Lungs: CTAB Abdomen: Soft, non tender, bowel sounds present Musculoskeletal: No edema.  Skin: No rash  LABS: CBC  Recent Labs Lab 08/18/13 1341 08/18/13 1345 08/19/13 0500  WBC 8.2  --  8.9  HGB 14.6 16.7* 14.0  HCT 42.3 49.0* 41.7  PLT 131*  --  104*   Coag's  Recent Labs Lab 08/18/13 1341  APTT 29  INR 1.05   BMET  Recent Labs Lab 08/18/13 1341 08/18/13 1345 08/19/13 0500  NA 141 143 140  K 3.7 3.5* 4.0  CL 105 104 105  CO2 21  --  24  BUN 10 8 8   CREATININE 0.66 0.70 0.55  GLUCOSE 174* 175* 158*   Electrolytes  Recent Labs Lab 08/18/13 1341 08/19/13 0500  CALCIUM 9.2 8.6  MG  --  1.7  PHOS  --  3.4   Sepsis Markers No results found for this basename: LATICACIDVEN, PROCALCITON, O2SATVEN,  in the last 168 hours ABG  Recent Labs Lab 08/19/13 0312  PHART 7.398  PCO2ART 40.5  PO2ART 119.0*   Liver Enzymes  Recent Labs Lab 08/18/13 1341 08/19/13 0500  AST 126* 117*  ALT 120* 110*  ALKPHOS 98 85  BILITOT 1.0 1.5*  ALBUMIN 3.2* 2.9*   Cardiac Enzymes No results found for this basename: TROPONINI, PROBNP,  in the last 168 hours Glucose  Recent Labs Lab 08/18/13 1355  GLUCAP 200*   IMAGING:  Ct Head Wo Contrast  08/18/2013   CLINICAL DATA:  Code stroke.  EXAM: CT HEAD WITHOUT CONTRAST  TECHNIQUE: Contiguous axial images were obtained from the base of the skull through the vertex without intravenous contrast.  COMPARISON:  None.  FINDINGS: There is a large acute intraparenchymal hemorrhage in the cerebellum, centered to the right of midline, but also crosses the midline. The hemorrhage measures approximately 4.8 x 4.7 cm axial dimension and causes significant mass effect in the posterior fossa. The fourth  ventricle appears to be nearly completely effaced, and there is loss of the normal basilar cisterns. On image number 2, there suggestion of tonsillar herniation. There is a rim of edema surrounding the hemorrhage. Early hydrocephalus involving the temporal horns of the lateral ventricles is suggested, but no gross/significant hydrocephalus is present at this time. Moderate degree of chronic microvascular ischemic changes are noted in the periventricular and subcortical white matter bilaterally. No supratentorial evidence of acute infarction.  The skull is intact. The visualized paranasal sinuses and mastoid air cells are clear.  IMPRESSION: Large acute intraparenchymal cerebellar hemorrhage with significant mass effect in the posterior fossa and probable tonsillar herniation. Fourth ventricle appears nearly completely effaced. Cannot exclude early hydrocephalus. Critical Value/emergent results were called by telephone at the time of interpretation on 08/18/2013 at 1:59 PM to Dr. Roland Rack , who was already aware of these results.   Electronically Signed   By: Curlene Dolphin M.D.   On: 08/18/2013 14:01   Mr Abdomen W Wo Contrast  08/17/2013   CLINICAL DATA:  Evaluate right renal lesion.  EXAM: MRI ABDOMEN WITHOUT AND WITH CONTRAST  TECHNIQUE: Multiplanar multisequence MR imaging of the abdomen was performed both before and after the administration of intravenous contrast.  CONTRAST:  50mL MULTIHANCE GADOBENATE DIMEGLUMINE 529 MG/ML IV SOLN  COMPARISON:  08/04/2013  FINDINGS: No pleural effusion identified. There is no focal liver abnormality. The gallbladder is normal. Stress set small stone is noted within the gallbladder. The common bile duct is normal in caliber. No intrahepatic bile duct dilatation. Normal appearance of the pancreas. The spleen is unremarkable. Normal appearance of the right adrenal gland. Loss of signal from the left adrenal gland between the in phase and out of phase images noted  compatible with a benign adenoma. Small cysts are identified within the inferior pole of the left kidney. Hemorrhagic cyst within the midpole of the right kidney measures 1.7 cm, image 58 of series 1200. 1.7 cm complicated cyst within the inferior pole of the right kidney is identified. No significant enhancement is associated with this structure compatible with a Bosniak category 2 cyst. There is a hemorrhagic cyst within the inferior pole of the left kidney which measures 5 mm. Bosniak category 1 cysts are also noted within the inferior pole the left kidney. Normal caliber of the abdominal aorta. No aneurysm. No upper abdominal adenopathy.  Review of the visualized osseous structures it is unremarkable.  IMPRESSION: 1. Cysts within the right kidney have imaging and enhancement characteristics compatible of benign Bosniak category 2 cysts. 2. Bosniak category 1 and 2 cysts within the left kidney. 3. Left adrenal adenoma.   Electronically Signed   By: Kerby Moors M.D.   On: 08/17/2013 16:54   Dg Chest Port 1 View  08/19/2013  CLINICAL DATA:  Acute respiratory failure.  EXAM: PORTABLE CHEST - 1 VIEW  COMPARISON:  08/18/2013  FINDINGS: Endotracheal tube, NG tube, and central venous catheter appear in good position, unchanged. Heart size and pulmonary vascularity are normal. There is minimal atelectasis at the lung bases, unchanged.  IMPRESSION: Minimal bibasilar atelectasis, stable.   Electronically Signed   By: Rozetta Nunnery M.D.   On: 08/19/2013 07:39   Dg Chest Port 1 View  08/18/2013   CLINICAL DATA:  cvl placement  EXAM: PORTABLE CHEST - 1 VIEW  COMPARISON:  DG CHEST 1V PORT dated 08/18/2013  FINDINGS: A right internal jugular catheter has been inserted tip level superior vena cava. There is no pneumothorax. The endotracheal tube and enteric tube are stable. Mild linear density left lung base. Cardiac silhouette within the upper limits of normal. Aorta is tortuous and ectatic concerning atherosclerotic  calcifications. No acute osseous abnormalities.  IMPRESSION: Right internal jugular catheter to the level superior vena cava. No pneumothorax.  Minimal atelectasis versus scarring left lung base.  Otherwise stable chest radiograph.   Electronically Signed   By: Margaree Mackintosh M.D.   On: 08/18/2013 21:13   Dg Chest Portable 1 View  08/18/2013   CLINICAL DATA:  Post intubation  EXAM: PORTABLE CHEST - 1 VIEW  COMPARISON:  CT ANGIO CHEST W/CM &/OR WO/CM dated 09/21/2012; DG CHEST 2 VIEW dated 09/21/2012  FINDINGS: Endotracheal tube is positioned 4.5 cm from carina. NG tube extends to the stomach. Normal cardiac silhouette. No effusion, infiltrate, or pneumothorax. Anterior cervical fusion noted. Benign-appearing chondroid lesion in the left humerus.  IMPRESSION: Endotracheal tube in appropriate position.   Electronically Signed   By: Suzy Bouchard M.D.   On: 08/18/2013 15:16   ASSESSMENT / PLAN:  PULMONARY A:   Acute respiratory failure in setting of acute encephalopathy Tobacco use disorder P:   Aim to extubated after head CT performed Nicotine patch  CARDIOVASCULAR A:  Hypertensive emergency P:  Goal SBP<160 Cardene gtt D/c Metoprolol Start Labetalol 200 bid Hydralazine PRN  RENAL A:   No active issues P:   Trend BMP NS@75   GASTROINTESTINAL A:   NPO GI Px Mildly elevated LFT likely secondary to h/o hepatitis P:   NPO Protonix Trend LFT  HEMATOLOGIC A:   VTE Px P:  Trend CBC SCDs  INFECTIOUS A:   No acute issues P:   No intervention required  ENDOCRINE  A:  Hyperglycemia On Prednisone, indication? P:   SSI Continue Prednisone for now  NEUROLOGIC A:   ICH s/p craniotomy Acute encephalopathy, resolving P:   Neurology / Neurosurgery following Repeat head CT before extubation D/c Propofol when back form CT scan Fentanyl PRN  I have personally obtained history, examined patient, evaluated and interpreted laboratory and imaging results, reviewed  medical records, formulated assessment / plan and placed orders.  CRITICAL CARE:  The patient is critically ill with multiple organ systems failure and requires high complexity decision making for assessment and support, frequent evaluation and titration of therapies, application of advanced monitoring technologies and extensive interpretation of multiple databases. Critical Care Time devoted to patient care services described in this note is 40 minutes.   Doree Fudge, MD Pulmonary and [place] Pager: [phone]  08/19/2013, 11:10 AM

## 2013-08-19 NOTE — Progress Notes (Signed)
eLink Physician-Brief Progress Note Patient Name: [name] DOB: [birth year] MRN: [id number]  Date of Service  08/19/2013   HPI/Events of Note   Can't take PO Hypertension on labetalol  eICU Interventions  Change to IV metop scheduled   Intervention Category Intermediate Interventions: Hypertension - evaluation and management  Juanito Doom 08/19/2013, 7:53 PM

## 2013-08-19 NOTE — Telephone Encounter (Signed)
Left message for pt to call for MRI results

## 2013-08-19 NOTE — Telephone Encounter (Signed)
Message copied by Ricci Barker on Thu Aug 19, 2013  4:47 PM ------      Message from: Angelica Chessman E      Created: Wed Aug 18, 2013 10:17 AM       Please inform patient that her MRI abdomen showed cysts in both kidneys with no evidence of malignancy. ------

## 2013-08-19 NOTE — Progress Notes (Signed)
05/22/13 1352 extubated patient with Nira Conn RN per MD orders. Placed patient on 2L [place] patient is stable RT will continue  to monitor.

## 2013-08-20 ENCOUNTER — Encounter (HOSPITAL_COMMUNITY): Payer: Self-pay | Admitting: Neurosurgery

## 2013-08-20 DIAGNOSIS — B1920 Unspecified viral hepatitis C without hepatic coma: Secondary | ICD-10-CM

## 2013-08-20 DIAGNOSIS — G935 Compression of brain: Secondary | ICD-10-CM | POA: Diagnosis not present

## 2013-08-20 DIAGNOSIS — I10 Essential (primary) hypertension: Secondary | ICD-10-CM

## 2013-08-20 DIAGNOSIS — G936 Cerebral edema: Secondary | ICD-10-CM | POA: Diagnosis present

## 2013-08-20 LAB — CBC
HEMATOCRIT: 39.5 % (ref 36.0–46.0)
Hemoglobin: 13 g/dL (ref 12.0–15.0)
MCH: 35.7 pg — AB (ref 26.0–34.0)
MCHC: 32.9 g/dL (ref 30.0–36.0)
MCV: 108.5 fL — AB (ref 78.0–100.0)
PLATELETS: 98 10*3/uL — AB (ref 150–400)
RBC: 3.64 MIL/uL — ABNORMAL LOW (ref 3.87–5.11)
RDW: 13.2 % (ref 11.5–15.5)
WBC: 12.8 10*3/uL — AB (ref 4.0–10.5)

## 2013-08-20 LAB — GLUCOSE, CAPILLARY
GLUCOSE-CAPILLARY: 187 mg/dL — AB (ref 70–99)
GLUCOSE-CAPILLARY: 113 mg/dL — AB (ref 70–99)
GLUCOSE-CAPILLARY: 109 mg/dL — AB (ref 70–99)
Glucose-Capillary: 113 mg/dL — ABNORMAL HIGH (ref 70–99)
Glucose-Capillary: 107 mg/dL — ABNORMAL HIGH (ref 70–99)
Glucose-Capillary: 92 mg/dL (ref 70–99)
Glucose-Capillary: 92 mg/dL (ref 70–99)

## 2013-08-20 LAB — BASIC METABOLIC PANEL
BUN: 10 mg/dL (ref 6–23)
CALCIUM: 9 mg/dL (ref 8.4–10.5)
CHLORIDE: 107 meq/L (ref 96–112)
CO2: 27 mEq/L (ref 19–32)
CREATININE: 0.54 mg/dL (ref 0.50–1.10)
GFR calc non Af Amer: >90 mL/min (ref >90)
Glucose, Bld: 109 mg/dL — ABNORMAL HIGH (ref 70–99)
Potassium: 4 mEq/L (ref 3.7–5.3)
Sodium: 140 mEq/L (ref 137–147)

## 2013-08-20 NOTE — Progress Notes (Signed)
Pt seen and examined. No issues overnight. Extubated yesterday, does not have any current complaints.  EXAM: Temp:  [98.3 F (36.8 C)-99.4 F (37.4 C)] 98.6 F (37 C) (05/22 1136) Pulse Rate:  [90-110] 94 (05/22 1500) Resp:  [13-25] 16 (05/22 1500) BP: (116-152)/(74-93) 140/93 mmHg (05/22 1500) SpO2:  [99 %-100 %] 100 % (05/22 1500) Weight:  [84.8 kg (186 lb 15.2 oz)] 84.8 kg (186 lb 15.2 oz) (05/22 0500) Intake/Output     05/21 0701 - 05/22 0700 05/22 0701 - 05/23 0700   I.V. (mL/kg) 2505.8 (29.5)    Total Intake(mL/kg) 2505.8 (29.5)    Urine (mL/kg/hr) 1260 (0.6) 350 (0.4)   Emesis/NG output     Blood     Total Output 1260 350   Net +1245.8 -350         Arouses to voice Oriented to person, place, year, reason for hospitalization Speech dysarthric Tongue midline, EOMI Moves all extremities good strength Wound c/d/i  LABS: Lab Results  Component Value Date   CREATININE 0.54 08/20/2013   BUN 10 08/20/2013   NA 140 08/20/2013   K 4.0 08/20/2013   CL 107 08/20/2013   CO2 27 08/20/2013   Lab Results  Component Value Date   WBC 12.8* 08/20/2013   HGB 13.0 08/20/2013   HCT 39.5 08/20/2013   MCV 108.5* 08/20/2013   PLT 98* 08/20/2013   IMAGING: CT head yesterday night demonstrates evacuation of large cerebellar hematoma. Pos fossa mass effect is reduced, with 4th ventricle now visible. No HCP.  IMPRESSION: - 58 y.o. female s/p suboccipital craniectomy for evacuation of cerebellar hematoma, recovering well  PLAN: - Cont current supportive care. - Will need NGT for feeds as she failed swallow study

## 2013-08-20 NOTE — Progress Notes (Signed)
Stroke Team Progress Note  HISTORY [name] is an 58 y.o. female who took a shower and then suddenly had a sever HA (worst HA of her life) then fell down on 08/18/2013 at 1300. There is question of loss consciousness. EMS was called and patient was transported to ER as Code Stroke. While in route she had emisis and SBP was 230. CT head showed a large cerebellar bleed. Patient was brought back to ER, intubated and Neurosurgery was consulted for possible immediate evacuation. Patient was given 5 grams mannitol and Cardiene was started to being BP down. Neurosurgery arrived and agreed to take patient for posterior decompression. Patient was not administered TPA secondary to hemorrhage. She was admitted post op to the neuro ICU for further evaluation and treatment.  SUBJECTIVE No family at bedside.  OBJECTIVE Most recent Vital Signs: Filed Vitals:   08/20/13 0309 08/20/13 0400 08/20/13 0500 08/20/13 0600  BP:  141/87 146/84 138/83  Pulse:  91 92 92  Temp: 98.3 F (36.8 C)     TempSrc: Axillary     Resp:  16 15 16   Height:      Weight:   84.8 kg (186 lb 15.2 oz)   SpO2:  100% 100% 100%   CBG (last 3)   Recent Labs  08/19/13 2003 08/19/13 2319 08/20/13 0309  GLUCAP 167* 113* 107*    IV Fluid Intake:   . sodium chloride 75 mL/hr at 08/19/13 2101  . niCARDipine Stopped (08/20/13 0739)    MEDICATIONS  . insulin aspart  0-15 Units Subcutaneous 6 times per day  . mannitol  25 g Intravenous Once  . metoprolol  5 mg Intravenous Q6H  . nicotine  14 mg Transdermal Daily  . ofloxacin  1 drop Both Eyes QID  . predniSONE  40 mg Oral Q breakfast  . senna-docusate  1 tablet Oral BID   PRN:  sodium chloride, acetaminophen, acetaminophen, fentaNYL, hydrALAZINE  Diet:  NPO  Activity:  Bedrest DVT Prophylaxis:  SCDs   CLINICALLY SIGNIFICANT STUDIES Basic Metabolic Panel:   Recent Labs Lab 08/18/13 1345 08/19/13 0500 08/20/13 0500  NA 143 140 140  K 3.5* 4.0 4.0  CL 104  105 107  CO2  --  24 27  GLUCOSE 175* 158* 109*  BUN 8 8 10   CREATININE 0.70 0.55 0.54  CALCIUM  --  8.6 9.0  MG  --  1.7  --   PHOS  --  3.4  --    Liver Function Tests:   Recent Labs Lab 08/18/13 1341 08/19/13 0500  AST 126* 117*  ALT 120* 110*  ALKPHOS 98 85  BILITOT 1.0 1.5*  PROT 7.0 6.4  ALBUMIN 3.2* 2.9*   CBC:   Recent Labs Lab 08/18/13 1341  08/19/13 0500 08/20/13 0500  WBC 8.2  --  8.9 12.8*  NEUTROABS 2.6  --   --   --   HGB 14.6  < > 14.0 13.0  HCT 42.3  < > 41.7 39.5  MCV 106.0*  --  107.5* 108.5*  PLT 131*  --  104* 98*  < > = values in this interval not displayed. Coagulation:   Recent Labs Lab 08/18/13 1341  LABPROT 13.5  INR 1.05   Cardiac Enzymes: No results found for this basename: CKTOTAL, CKMB, CKMBINDEX, TROPONINI,  in the last 168 hours Urinalysis:   Recent Labs Lab 08/18/13 [place] 1.012  PHURINE 7.0  GLUCOSEU [place]  NEGATIVE  KETONESUR NEGATIVE  PROTEINUR NEGATIVE  UROBILINOGEN 1.0  NITRITE NEGATIVE  LEUKOCYTESUR NEGATIVE   Lipid Panel    Component Value Date/Time   CHOL 114 03/30/2013 1624   TRIG 84 08/18/2013 1931   HDL 39* 03/30/2013 1624   CHOLHDL 2.9 03/30/2013 1624   VLDL 17 03/30/2013 1624   LDLCALC 58 03/30/2013 1624   HgbA1C  Lab Results  Component Value Date   HGBA1C 5.0 03/30/2013    Urine Drug Screen:     Component Value Date/Time   LABOPIA NONE DETECTED 08/18/2013 1410   COCAINSCRNUR POSITIVE* 08/18/2013 1410   LABBENZ NONE DETECTED 08/18/2013 1410   AMPHETMU NONE DETECTED 08/18/2013 1410   THCU NONE DETECTED 08/18/2013 1410   LABBARB NONE DETECTED 08/18/2013 1410    Alcohol Level:   Recent Labs Lab 08/18/13 1341  ETH 23*    Mr Abdomen W Wo Contrast  08/17/2013   1. Cysts within the right kidney have imaging and enhancement characteristics compatible of benign Bosniak category 2 cysts. 2. Bosniak category 1 and 2 cysts within the left kidney.  3. Left adrenal adenoma.      CT of the brain   08/19/2013   Interval surgical decompression of posterior fossa hematoma as detailed above.    08/18/2013    Large acute intraparenchymal cerebellar hemorrhage with significant mass effect in the posterior fossa and probable tonsillar herniation. Fourth ventricle appears nearly completely effaced. Cannot exclude early hydrocephalus.   MRI of the brain    MRA of the brain    2D Echocardiogram    Carotid Doppler    CXR   08/19/2013   Minimal bibasilar atelectasis, stable.    08/18/2013    Right internal jugular catheter to the level superior vena cava. No pneumothorax.  Minimal atelectasis versus scarring left lung base.  Otherwise stable chest radiograph.    08/18/2013    Endotracheal tube in appropriate position.   Electronically Signed   By: Suzy Bouchard M.D.   On: 08/18/2013 15:16   EKG  normal sinus rhythm. For complete results please see formal report.   Therapy Recommendations   Physical Exam   Middle aged lady intubated. . Afebrile. Head is nontraumatic. Neck is supple without bruit.    Cardiac exam no murmur or gallop. Lungs are clear to auscultation. Distal pulses are well felt. Neurological Exam : awak alert   . Follows gaze in all directions. No nystagmus. Blinks to threat bilaterally. Fundi were not visualized. Vision acuity cannot be reliably tested. Follows commands consistently in all 4 extremities. Good antigravity strength on the right. Able to move left side but mild weakness compared to the right. Deep tendon reflexes symmetric. Plantars are downgoing.  ASSESSMENT [name] is a 58 y.o. female presenting with with the worst headache of her life. Imaging confirms a large cerebellar hemorrhage with mass effect and tonsillar herniation, 4th vertical effaced. Status post decompressive craniotomy per Dr. Kathyrn Sheriff. Hemorrhage secondary to malginant hypertension as a result of cocaine use with SBP > 230 on arrival.  On no antithrombotics prior to admission. Patient with resultant aphasia. Stroke work up underway.   VDRF, extubated 5/21  Malignant hypertension  cardene drip, home meds resumed though tube removed at time of extubation, now on IV metoprolol scheduled  BP 130-140s  Hepatitis C  Cigarette smoker  etoh use, positive on admission  Cocaine use, positive on admission  On prednisone at home, ? Indication, resumed based on home use  Hospital day #  2  TREATMENT/PLAN  Continue ICU level care today  Transfer to the floor tomorrow if remains stable  SBP goal < 180  OOB. Therapy evals  Wean cardene. Resume home BP meds if passes swallow eval  Ask patient about prednisone; consider decreased dose/stopping  lovenox for VTE prophy tomorrow if remains stable  Burnetta Sabin, MSN, RN, ANVP-BC, AGPCNP-BC [facility] Pager: [phone] 08/20/2013 9:02 AM This patient is critically ill and at significant risk of neurological worsening, death and care requires constant monitoring of vital signs, hemodynamics,respiratory and cardiac monitoring,review of multiple databases, neurological assessment, discussion with family, other specialists and medical decision making of high complexity. I spent 30 minutes of neurocritical care time  in the care of  this patient. I have personally examined this patient, developed plan of care, reviewed imaging and lab results and x-ray with above Antony Contras, MD  To contact Stroke Continuity provider, please refer to [email or web address] After hours, contact General Neurology

## 2013-08-20 NOTE — Progress Notes (Signed)
MD Kathyrn Sheriff notified that pt failed swallowing test. Unable to reach Dr Leonie Man nor Dr. Earnest Conroy to notify of same. ST to continue to work with pt.

## 2013-08-20 NOTE — Progress Notes (Signed)
PULMONARY / CRITICAL CARE MEDICINE  Name: [name] MRN: [id number] DOB: 22-Sep-1955    ADMISSION DATE:  08/18/2013 CONSULTATION DATE:  08/18/2013  REFERRING MD :  Kathyrn Sheriff PRIMARY SERVICE:  PCCM  CHIEF COMPLAINT:  ICH s/p emergent posterior fossa decompression  BRIEF PATIENT DESCRIPTION: 58 yo with HTN brought to ED with sudden onset severe headache and was intubated for airway protection. CT revealed a large acute intraparenchymal cerebellar hemorrage with significant mass effect in posterior fossa.  Was taken to the OR emergently for a posterior fossa decompression. PCCM was consulted post op.  SIGNIFICANT EVENTS / STUDIES:  CT head 5/20 >>> large acute intraparenchymal cerebellar hemorrage with significant mass effect in posterior fossa, 4th ventricle almost fully effaced OR 5/20 >>> emergent posterior fossa decompression  LINES / TUBES: OETT 5/20 >>> 5/21 OGT 5/20 >>> 5/21 Foley 5/20 >>> Right IJ CVL 5/20 >>> Left radial A-line 5/20 >>>  5/21  CULTURES:  ANTIBIOTICS:  INTERVAL HISTORY:  Remains extubated, no overnight issues  VITAL SIGNS: Temp:  [97.8 F (36.6 C)-99.4 F (37.4 C)] 98.3 F (36.8 C) (05/22 0309) Pulse Rate:  [91-114] 92 (05/22 0600) Resp:  [14-32] 16 (05/22 0600) BP: (122-157)/(74-93) 138/83 mmHg (05/22 0600) SpO2:  [99 %-100 %] 100 % (05/22 0600) Arterial Line BP: (102-189)/(85-92) 102/92 mmHg (05/21 1600) FiO2 (%):  [40 %] 40 % (05/21 1310) Weight:  [84.8 kg (186 lb 15.2 oz)] 84.8 kg (186 lb 15.2 oz) (05/22 0500)  HEMODYNAMICS:   VENTILATOR SETTINGS: Vent Mode:  [-] PSV FiO2 (%):  [40 %] 40 % Set Rate:  [14 bmp] 14 bmp Vt Set:  [500 mL] 500 mL PEEP:  [5 cmH20] 5 cmH20 Pressure Support:  [5 cmH20-10 cmH20] 5 cmH20 Plateau Pressure:  [17 cmH20] 17 cmH20  INTAKE / OUTPUT: Intake/Output     05/21 0701 - 05/22 0700 05/22 0701 - 05/23 0700   I.V. (mL/kg) 2505.8 (29.5)    Total Intake(mL/kg) 2505.8 (29.5)    Urine (mL/kg/hr) 1260 (0.6)     Emesis/NG output     Blood     Total Output 1260     Net +1245.8           PHYSICAL EXAMINATION: General: Comfortable, no distress Neuro: Sleepy but arouses to stimulation HEENT: PERRL Cardiovascular: Regular, no distress Lungs: CTAB Abdomen: Soft, non tender, bowel sounds present Musculoskeletal: No edema.  Skin: No rash  LABS: CBC  Recent Labs Lab 08/18/13 1341 08/18/13 1345 08/19/13 0500 08/20/13 0500  WBC 8.2  --  8.9 12.8*  HGB 14.6 16.7* 14.0 13.0  HCT 42.3 49.0* 41.7 39.5  PLT 131*  --  104* 98*   Coag's  Recent Labs Lab 08/18/13 1341  APTT 29  INR 1.05   BMET  Recent Labs Lab 08/18/13 1341 08/18/13 1345 08/19/13 0500 08/20/13 0500  NA 141 143 140 140  K 3.7 3.5* 4.0 4.0  CL 105 104 105 107  CO2 21  --  24 27  BUN 10 8 8 10   CREATININE 0.66 0.70 0.55 0.54  GLUCOSE 174* 175* 158* 109*   Electrolytes  Recent Labs Lab 08/18/13 1341 08/19/13 0500 08/20/13 0500  CALCIUM 9.2 8.6 9.0  MG  --  1.7  --   PHOS  --  3.4  --    Sepsis Markers No results found for this basename: LATICACIDVEN, PROCALCITON, O2SATVEN,  in the last 168 hours ABG  Recent Labs Lab 08/19/13 0312  PHART 7.398  PCO2ART 40.5  PO2ART 119.0*  Liver Enzymes  Recent Labs Lab 08/18/13 1341 08/19/13 0500  AST 126* 117*  ALT 120* 110*  ALKPHOS 98 85  BILITOT 1.0 1.5*  ALBUMIN 3.2* 2.9*   Cardiac Enzymes No results found for this basename: TROPONINI, PROBNP,  in the last 168 hours Glucose  Recent Labs Lab 08/18/13 1355 08/19/13 1345 08/19/13 2003 08/19/13 2319 08/20/13 0309  GLUCAP 200* 177* 167* 113* 107*   IMAGING:  Ct Head Wo Contrast  08/19/2013   CLINICAL DATA:  Followup intracranial hemorrhage.  Hypertension.  EXAM: CT HEAD WITHOUT CONTRAST  TECHNIQUE: Contiguous axial images were obtained from the base of the skull through the vertex without intravenous contrast.  COMPARISON:  08/18/2013 head CT.  FINDINGS: Post suboccipital craniectomy with  surgical evacuation of posterior fossa hematoma. Small amount of blood right lateral aspect and posterior aspect of the surgical site. Small amount of pneumocephalus. Decreased associated mass effect with less distortion of the fourth ventricle. Right cerebellar tonsil remains slightly low lying. No hydrocephalus.  Cause of the intracranial hemorrhage is indeterminate. After postoperative changes have cleared, followup contrast-enhanced MR may be considered.  Remote right lenticular nucleus infarct. Small vessel disease type changes. No CT evidence of large acute thrombotic infarct.  IMPRESSION: Interval surgical decompression of posterior fossa hematoma as detailed above.   Electronically Signed   By: Chauncey Cruel M.D.   On: 08/19/2013 12:34   Ct Head Wo Contrast  08/18/2013   CLINICAL DATA:  Code stroke.  EXAM: CT HEAD WITHOUT CONTRAST  TECHNIQUE: Contiguous axial images were obtained from the base of the skull through the vertex without intravenous contrast.  COMPARISON:  None.  FINDINGS: There is a large acute intraparenchymal hemorrhage in the cerebellum, centered to the right of midline, but also crosses the midline. The hemorrhage measures approximately 4.8 x 4.7 cm axial dimension and causes significant mass effect in the posterior fossa. The fourth ventricle appears to be nearly completely effaced, and there is loss of the normal basilar cisterns. On image number 2, there suggestion of tonsillar herniation. There is a rim of edema surrounding the hemorrhage. Early hydrocephalus involving the temporal horns of the lateral ventricles is suggested, but no gross/significant hydrocephalus is present at this time. Moderate degree of chronic microvascular ischemic changes are noted in the periventricular and subcortical white matter bilaterally. No supratentorial evidence of acute infarction.  The skull is intact. The visualized paranasal sinuses and mastoid air cells are clear.  IMPRESSION: Large acute  intraparenchymal cerebellar hemorrhage with significant mass effect in the posterior fossa and probable tonsillar herniation. Fourth ventricle appears nearly completely effaced. Cannot exclude early hydrocephalus. Critical Value/emergent results were called by telephone at the time of interpretation on 08/18/2013 at 1:59 PM to Dr. Roland Rack , who was already aware of these results.   Electronically Signed   By: Curlene Dolphin M.D.   On: 08/18/2013 14:01   Dg Chest Port 1 View  08/19/2013   CLINICAL DATA:  Acute respiratory failure.  EXAM: PORTABLE CHEST - 1 VIEW  COMPARISON:  08/18/2013  FINDINGS: Endotracheal tube, NG tube, and central venous catheter appear in good position, unchanged. Heart size and pulmonary vascularity are normal. There is minimal atelectasis at the lung bases, unchanged.  IMPRESSION: Minimal bibasilar atelectasis, stable.   Electronically Signed   By: Rozetta Nunnery M.D.   On: 08/19/2013 07:39   Dg Chest Port 1 View  08/18/2013   CLINICAL DATA:  cvl placement  EXAM: PORTABLE CHEST - 1 VIEW  COMPARISON:  DG CHEST 1V PORT dated 08/18/2013  FINDINGS: A right internal jugular catheter has been inserted tip level superior vena cava. There is no pneumothorax. The endotracheal tube and enteric tube are stable. Mild linear density left lung base. Cardiac silhouette within the upper limits of normal. Aorta is tortuous and ectatic concerning atherosclerotic calcifications. No acute osseous abnormalities.  IMPRESSION: Right internal jugular catheter to the level superior vena cava. No pneumothorax.  Minimal atelectasis versus scarring left lung base.  Otherwise stable chest radiograph.   Electronically Signed   By: Margaree Mackintosh M.D.   On: 08/18/2013 21:13   Dg Chest Portable 1 View  08/18/2013   CLINICAL DATA:  Post intubation  EXAM: PORTABLE CHEST - 1 VIEW  COMPARISON:  CT ANGIO CHEST W/CM &/OR WO/CM dated 09/21/2012; DG CHEST 2 VIEW dated 09/21/2012  FINDINGS: Endotracheal tube is  positioned 4.5 cm from carina. NG tube extends to the stomach. Normal cardiac silhouette. No effusion, infiltrate, or pneumothorax. Anterior cervical fusion noted. Benign-appearing chondroid lesion in the left humerus.  IMPRESSION: Endotracheal tube in appropriate position.   Electronically Signed   By: Suzy Bouchard M.D.   On: 08/18/2013 15:16   ASSESSMENT / PLAN:  PULMONARY A:   Acute respiratory failure in setting of acute encephalopathy Tobacco use disorder P:   Supplemental oxygen PRN for SpO2>92 Nicotine patch  CARDIOVASCULAR A:  Hypertensive emergency P:  Goal SBP<160 Cardene gtt Metoprolol IV Labetalol 200 bid when able to take PO or feeding tube placed Hydralazine PRN  RENAL A:   No active issues P:   Trend BMP NS@75   GASTROINTESTINAL A:   At risk for dysphagia / aspiration GI Px is not indicated Mildly elevated LFT likely secondary to h/o hepatitis P:   NPO SLP eval, may need feeding tube D/c Protonix Trend LFT  HEMATOLOGIC A:   VTE Px Thrombocytopenia P:  Trend CBC SCDs  INFECTIOUS A:   No acute issues P:   No intervention required  ENDOCRINE  A:  Hyperglycemia On Prednisone, indication? P:   SSI Continue Prednisone for now  NEUROLOGIC A:   ICH s/p craniotomy Acute encephalopathy, resolving P:   Neurology / Neurosurgery following Fentanyl PRN  I have personally obtained history, examined patient, evaluated and interpreted laboratory and imaging results, reviewed medical records, formulated assessment / plan and placed orders.  Doree Fudge, MD Pulmonary and [place] Pager: [phone]  08/20/2013, 7:05 AM

## 2013-08-20 NOTE — Evaluation (Signed)
Clinical/Bedside Swallow Evaluation Patient Details  Name: [name] MRN: [id number] Date of Birth: Sep 08, 1955  Today's Date: 08/20/2013 Time: 1020-1110 SLP Time Calculation (min): 50 min  Past Medical History:  Past Medical History  Diagnosis Date  . Hepatitis C   . Pneumonia   . Hypertension    Past Surgical History:  Past Surgical History  Procedure Laterality Date  . Neck surgery    . Shoulder surgery Bilateral   . Craniectomy N/A 08/18/2013    Procedure: CRANIECTOMY POSTERIOR FOSSA DECOMPRESSION, EVACUATION OF HEMORRHAGE;  Surgeon: Consuella Lose, MD;  Location: [facility];  Service: Neurosurgery;  Laterality: N/A;   HPI:  58 year old female admitted 08/18/13 due to AMS with headache, fall and slurred speech. Pt intubated 5/20-21/15. PMH significant for HepC, PNA, HTN. CT revealed large posterior fossa hematoma with compression of the medulla/pons and effacement of the 4th ventricle  Pt underwent posterior fossa decompression. BSE ordered to evaluate po readiness.   Assessment / Plan / Recommendation Clinical Impression  Pt sleeping when SLP arrived.  Pt able to arouse, but continued to be quite lethargic throughout oral care and po trials.  Oral care completed with suction. Pt is edentulous, but reports she has dentures and is used to eating with them in place.  Pt  oral motor strength is significantly weak, but at this time, it is difficult to determine what is true muscular weakness and what is lethargy. Minimal  oral manipulation of ice chips, water and puree noted.  Cough response noted immediately following swallow of thin liquid.  Poor oral strength and control of po trials, and minimal laryngeal elevation noted.  Suspect delayed swallow reflex.  At this time, pt is deemed unsafe for po intake, due to oral weakness and lethargy.  Given location of ICH, objective study may be beneficial prior to po intake. ST to continue to follow for readiness for po/objective  study.    Aspiration Risk  Severe    Diet Recommendation NPO   Medication Administration: Via alternative means    Other  Recommendations Oral Care Recommendations: Oral care Q4 per protocol;Staff/trained caregiver to provide oral care Other Recommendations: Have oral suction available   Follow Up Recommendations  Inpatient Rehab    Frequency and Duration min 1 x/week  1 week   Pertinent Vitals/Pain VSS, no pain reported    SLP Swallow Goals  exhibit readiness for po intake   Swallow Study Prior Functional Status   unknown at this time. Pt unable to provide information, and no family present.    General Date of Onset: 08/18/13 HPI: 58 year old female admitted 08/18/13 due to AMS with headache, fall and slurred speech. Pt intubated 5/20-21/15. PMH significant for HepC, PNA, HTN. CT revealed large posterior fossa hematoma with compression of the medulla/pons and effacement of the 4th ventricle  Pt underwent posterior fossa decompression. BSE ordered to evaluate po readiness. Type of Study: Bedside swallow evaluation Previous Swallow Assessment: none Diet Prior to this Study: NPO Temperature Spikes Noted: No Respiratory Status: Nasal cannula History of Recent Intubation: Yes Length of Intubations (days): 2 days Date extubated: 08/19/13 Behavior/Cognition: Cooperative;Lethargic;Confused Oral Cavity - Dentition: Edentulous;Dentures, not available Self-Feeding Abilities: Total assist Patient Positioning: Upright in bed Baseline Vocal Quality: Hoarse Volitional Cough: Weak Volitional Swallow: Unable to elicit    Oral/Motor/Sensory Function Overall Oral Motor/Sensory Function: Impaired Labial Strength: Reduced Lingual Strength: Reduced Facial Strength: Reduced   Ice Chips Ice chips: Impaired Presentation: Spoon Oral Phase Impairments: Reduced lingual  movement/coordination Pharyngeal Phase Impairments: Decreased hyoid-laryngeal movement   Thin Liquid Thin Liquid:  Impaired Presentation: Straw Oral Phase Impairments: Reduced labial seal;Reduced lingual movement/coordination Pharyngeal  Phase Impairments: Cough - Immediate;Decreased hyoid-laryngeal movement;Suspected delayed Swallow    Nectar Thick Nectar Thick Liquid: Not tested   Honey Thick Honey Thick Liquid: Not tested   Puree Puree: Impaired Presentation: Spoon Oral Phase Impairments: Reduced labial seal;Reduced lingual movement/coordination;Impaired anterior to posterior transit;Poor awareness of bolus Oral Phase Functional Implications: Right anterior spillage Pharyngeal Phase Impairments: Suspected delayed Swallow;Decreased hyoid-laryngeal movement;Multiple swallows   Solid   GO   Roberta Kelly B. Quentin [facility], CCC-SLP [phone] Solid: Not tested       Lorre Nick 08/20/2013,11:28 AM

## 2013-08-21 LAB — BASIC METABOLIC PANEL
BUN: 12 mg/dL (ref 6–23)
CALCIUM: 9 mg/dL (ref 8.4–10.5)
CO2: 28 mEq/L (ref 19–32)
Chloride: 104 mEq/L (ref 96–112)
Creatinine, Ser: 0.56 mg/dL (ref 0.50–1.10)
GFR calc Af Amer: >90 mL/min (ref >90)
Glucose, Bld: 75 mg/dL (ref 70–99)
Potassium: 3.7 mEq/L (ref 3.7–5.3)
Sodium: 141 mEq/L (ref 137–147)

## 2013-08-21 LAB — CBC
HEMATOCRIT: 38.5 % (ref 36.0–46.0)
Hemoglobin: 12.7 g/dL (ref 12.0–15.0)
MCH: 35.9 pg — ABNORMAL HIGH (ref 26.0–34.0)
MCHC: 33 g/dL (ref 30.0–36.0)
MCV: 108.8 fL — AB (ref 78.0–100.0)
PLATELETS: 99 10*3/uL — AB (ref 150–400)
RBC: 3.54 MIL/uL — ABNORMAL LOW (ref 3.87–5.11)
RDW: 12.8 % (ref 11.5–15.5)
WBC: 8.8 10*3/uL (ref 4.0–10.5)

## 2013-08-21 LAB — GLUCOSE, CAPILLARY
GLUCOSE-CAPILLARY: 79 mg/dL (ref 70–99)
GLUCOSE-CAPILLARY: 125 mg/dL — AB (ref 70–99)
Glucose-Capillary: 81 mg/dL (ref 70–99)
Glucose-Capillary: 79 mg/dL (ref 70–99)
Glucose-Capillary: 92 mg/dL (ref 70–99)
Glucose-Capillary: 99 mg/dL (ref 70–99)

## 2013-08-21 MED ORDER — PREDNISONE 20 MG PO TABS
30.0000 mg | ORAL_TABLET | Freq: Every day | ORAL | Status: DC
  Administered 2013-08-22 – 2013-08-26 (×5): 30 mg via ORAL
  Filled 2013-08-21 (×7): qty 1

## 2013-08-21 MED ORDER — FENTANYL CITRATE 0.05 MG/ML IJ SOLN
50.0000 ug | INTRAMUSCULAR | Status: DC | PRN
  Administered 2013-08-22 – 2013-08-23 (×3): 100 ug via INTRAVENOUS
  Filled 2013-08-21 (×3): qty 2

## 2013-08-21 MED ORDER — METOPROLOL TARTRATE 25 MG PO TABS
25.0000 mg | ORAL_TABLET | Freq: Two times a day (BID) | ORAL | Status: DC
  Administered 2013-08-21 – 2013-08-26 (×11): 25 mg via ORAL
  Filled 2013-08-21 (×13): qty 1

## 2013-08-21 NOTE — Progress Notes (Signed)
Stroke Team Progress Note  HISTORY [name] is an 58 y.o. female who took a shower and then suddenly had a sever HA (worst HA of her life) then fell down on 08/18/2013 at 1300. There is question of loss consciousness. EMS was called and patient was transported to ER as Code Stroke. While in route she had emisis and SBP was 230. CT head showed a large cerebellar bleed. Patient was brought back to ER, intubated and Neurosurgery was consulted for possible immediate evacuation. Patient was given 5 grams mannitol and Cardiene was started to being BP down. Neurosurgery arrived and agreed to take patient for posterior decompression. Patient was not administered TPA secondary to hemorrhage. She was admitted post op to the neuro ICU for further evaluation and treatment.  SUBJECTIVE No family at bedside.  OBJECTIVE Most recent Vital Signs: Filed Vitals:   08/21/13 0500 08/21/13 0600 08/21/13 0700 08/21/13 0800  BP: 145/83 155/94 144/91 127/73  Pulse: 83 82 80 79  Temp:      TempSrc:      Resp: 18 16 15 16   Height:      Weight: 186 lb 8.2 oz (84.6 kg)     SpO2: 99% 99% 99% 99%   CBG (last 3)   Recent Labs  08/20/13 2324 08/21/13 0357 08/21/13 0831  GLUCAP 81 79 79    IV Fluid Intake:   . sodium chloride 75 mL/hr at 08/20/13 2340  . niCARDipine Stopped (08/20/13 0739)    MEDICATIONS  . insulin aspart  0-15 Units Subcutaneous 6 times per day  . mannitol  25 g Intravenous Once  . metoprolol  5 mg Intravenous Q6H  . nicotine  14 mg Transdermal Daily  . ofloxacin  1 drop Both Eyes QID  . predniSONE  40 mg Oral Q breakfast  . senna-docusate  1 tablet Oral BID   PRN:  sodium chloride, acetaminophen, acetaminophen, fentaNYL, hydrALAZINE  Diet:  NPO  Activity:  Out of bed with assistance DVT Prophylaxis:  SCDs   CLINICALLY SIGNIFICANT STUDIES Basic Metabolic Panel:   Recent Labs Lab 08/18/13 1345 08/19/13 0500 08/20/13 0500 08/21/13 0500  NA 143 140 140 141  K 3.5* 4.0  4.0 3.7  CL 104 105 107 104  CO2  --  24 27 28   GLUCOSE 175* 158* 109* 75  BUN 8 8 10 12   CREATININE 0.70 0.55 0.54 0.56  CALCIUM  --  8.6 9.0 9.0  MG  --  1.7  --   --   PHOS  --  3.4  --   --    Liver Function Tests:   Recent Labs Lab 08/18/13 1341 08/19/13 0500  AST 126* 117*  ALT 120* 110*  ALKPHOS 98 85  BILITOT 1.0 1.5*  PROT 7.0 6.4  ALBUMIN 3.2* 2.9*   CBC:   Recent Labs Lab 08/18/13 1341  08/20/13 0500 08/21/13 0500  WBC 8.2  < > 12.8* 8.8  NEUTROABS 2.6  --   --   --   HGB 14.6  < > 13.0 12.7  HCT 42.3  < > 39.5 38.5  MCV 106.0*  < > 108.5* 108.8*  PLT 131*  < > 98* 99*  < > = values in this interval not displayed. Coagulation:   Recent Labs Lab 08/18/13 1341  LABPROT 13.5  INR 1.05   Cardiac Enzymes: No results found for this basename: CKTOTAL, CKMB, CKMBINDEX, TROPONINI,  in the last 168 hours Urinalysis:   Recent Labs Lab 08/18/13 1410  COLORURINE YELLOW  LABSPEC 1.012  PHURINE 7.0  GLUCOSEU 100*  HGBUR NEGATIVE  BILIRUBINUR NEGATIVE  KETONESUR NEGATIVE  PROTEINUR NEGATIVE  UROBILINOGEN 1.0  NITRITE NEGATIVE  LEUKOCYTESUR NEGATIVE   Lipid Panel    Component Value Date/Time   CHOL 114 03/30/2013 1624   TRIG 84 08/18/2013 1931   HDL 39* 03/30/2013 1624   CHOLHDL 2.9 03/30/2013 1624   VLDL 17 03/30/2013 1624   LDLCALC 58 03/30/2013 1624   HgbA1C  Lab Results  Component Value Date   HGBA1C 5.0 03/30/2013    Urine Drug Screen:     Component Value Date/Time   LABOPIA NONE DETECTED 08/18/2013 1410   COCAINSCRNUR POSITIVE* 08/18/2013 1410   LABBENZ NONE DETECTED 08/18/2013 1410   AMPHETMU NONE DETECTED 08/18/2013 1410   THCU NONE DETECTED 08/18/2013 1410   LABBARB NONE DETECTED 08/18/2013 1410    Alcohol Level:   Recent Labs Lab 08/18/13 1341  ETH 23*    Mr Abdomen W Wo Contrast  08/17/2013   1. Cysts within the right kidney have imaging and enhancement characteristics compatible of benign Bosniak category 2 cysts. 2.  Bosniak category 1 and 2 cysts within the left kidney. 3. Left adrenal adenoma.      CT of the brain   08/19/2013   Interval surgical decompression of posterior fossa hematoma as detailed above.    08/18/2013    Large acute intraparenchymal cerebellar hemorrhage with significant mass effect in the posterior fossa and probable tonsillar herniation. Fourth ventricle appears nearly completely effaced. Cannot exclude early hydrocephalus.   MRI of the brain    MRA of the brain    2D Echocardiogram    Carotid Doppler    CXR   08/19/2013   Minimal bibasilar atelectasis, stable.    08/18/2013    Right internal jugular catheter to the level superior vena cava. No pneumothorax.  Minimal atelectasis versus scarring left lung base.  Otherwise stable chest radiograph.    08/18/2013    Endotracheal tube in appropriate position.   Electronically Signed   By: Suzy Bouchard M.D.   On: 08/18/2013 15:16   EKG  normal sinus rhythm. For complete results please see formal report.   Therapy Recommendations   Physical Exam   Middle aged lady. Afebrile. Head is nontraumatic. Neck is supple without bruit.    Cardiac exam no murmur or gallop. Lungs are clear to auscultation. Distal pulses are well felt. Neurological Exam : awak alert oriented to name, Zacarias Pontes and May 2015   . Follows gaze in all directions. No nystagmus. Blinks to threat bilaterally. Fundi were not visualized. Vision acuity cannot be reliably tested. Follows commands consistently in all 4 extremities. Good antigravity strength on the right. Able to move left side but mild weakness compared to the right. Deep tendon reflexes symmetric. Plantars are downgoing.  ASSESSMENT [name] is a 58 y.o. female presenting with with the worst headache of her life. Imaging confirms a large cerebellar hemorrhage with mass effect and tonsillar herniation, 4th vertical effaced. Status post decompressive craniotomy per Dr. Kathyrn Sheriff. Hemorrhage  secondary to malginant hypertension as a result of cocaine use with SBP > 230 on arrival. On no antithrombotics prior to admission. Stroke work up underway.   VDRF, extubated 5/21  Malignant hypertension  cardene drip d/c, home meds resumed though tube removed at time of extubation, now on IV metoprolol scheduled  BP 130-140s  Hepatitis C  Cigarette smoker  etoh use, positive on admission  Cocaine  use, positive on admission  On prednisone at home, ? Indication, resumed based on home use  Hospital day # 3  TREATMENT/PLAN  Continue ICU level care today  SBP goal < 180  OOB. Therapy evals, awaiting SLP bedside evaluation  Off cardene. Resume home BP meds if passes swallow eval, currently on scheduled IV metoprolol  Ask patient about prednisone; consider decreased dose/stopping  Currently on ICP for DVT ppx, can consider Lovenox subq if ok with neurosurgery, will continue ICP for now  This patient is critically ill and at significant risk of neurological worsening, death and care requires constant monitoring of vital signs, hemodynamics,respiratory and cardiac monitoring,review of multiple databases, neurological assessment, discussion with family, other specialists and medical decision making of high complexity. I spent 35 minutes of neurocritical care time  in the care of  this patient.   Jim Like, DO [facility] Pager: [phone]   To contact Stroke Continuity provider, please refer to [email or web address] After hours, contact General Neurology

## 2013-08-21 NOTE — Evaluation (Signed)
Physical Therapy Evaluation Patient Details Name: [name] MRN: [id number] DOB: [birth year] Today's Date: 08/21/2013   History of Present Illness  58 year old female admitted 08/18/13 due to AMS with headache, fall and slurred speech. Pt intubated 5/20-21/15. PMH significant for HepC, PNA, HTN. CT revealed large posterior fossa hematoma with compression of the medulla/pons and effacement of the 4th ventricle  Pt underwent posterior fossa decompression  Clinical Impression  Pt admitted with large cerebellar hemorrage. Pt currently with functional limitations due to the deficits listed below (see PT Problem List). Pt with slurred speech however oriented x 3.  Pt with nystagmus noted in supine and continuous dizziness throughout evaluation with impaired bilateral LE and UE coordination deficits. Pt will benefit from skilled PT to increase their independence and safety with mobility to allow discharge to the venue listed below.      Follow Up Recommendations CIR;Supervision/Assistance - 24 hour    Equipment Recommendations  Other (comment) (TBD)    Recommendations for Other Services Rehab consult     Precautions / Restrictions Precautions Precautions: Fall      Mobility  Bed Mobility Overal bed mobility: +2 for physical assistance;Needs Assistance Bed Mobility: Supine to Sit     Supine to sit: +2 for physical assistance     General bed mobility comments: (A) to elevate trunk OOB with max cues for technique.   Transfers Overall transfer level: Needs assistance Equipment used: 2 person hand held assist Transfers: Stand Pivot Transfers   Stand pivot transfers: +2 physical assistance;Max assist       General transfer comment: (A) to initiate forward translation and trunk extension with manual cues to prevent bilateral LE knee buckling.  Pt with poor coordination and continuous dizziness limiting overall transfer ability.   Ambulation/Gait                 Stairs            Wheelchair Mobility    Modified Rankin (Stroke Patients Only) Modified Rankin (Stroke Patients Only) Pre-Morbid Rankin Score: No symptoms Modified Rankin: Severe disability     Balance Overall balance assessment: Needs assistance Sitting-balance support: Feet supported Sitting balance-Leahy Scale: Poor Sitting balance - Comments: Pt with posterior lean and swaying due to overall dizziness.  Postural control: Posterior lean Standing balance support: Bilateral upper extremity supported Standing balance-Leahy Scale: Zero Standing balance comment: Pt unable to maintain uprigh posture without 2 person assistance due to bilateral knee buckling                             Pertinent Vitals/Pain No reports of pain when asked. VSS    Home Living Family/patient expects to be discharged to:: Private residence   Available Help at Discharge: Family Type of Home:  (Townhome) Home Access: Level entry     Home Layout: One level Home Equipment: None      Prior Function Level of Independence: Independent               Hand Dominance   Dominant Hand: Right    Extremity/Trunk Assessment               Lower Extremity Assessment: Generalized weakness;RLE deficits/detail;LLE deficits/detail         Communication   Communication: Receptive difficulties  Cognition Arousal/Alertness: Awake/alert Behavior During Therapy: Flat affect Overall Cognitive Status: Impaired/Different from baseline Area of Impairment: Problem solving;Attention   Current Attention Level: Alternating  Problem Solving: Slow processing;Difficulty sequencing      General Comments      Exercises        Assessment/Plan    PT Assessment Patient needs continued PT services  PT Diagnosis Difficulty walking;Abnormality of gait;Generalized weakness   PT Problem List Decreased strength;Decreased activity tolerance;Decreased balance;Decreased  mobility;Decreased coordination;Decreased cognition;Decreased knowledge of use of DME  PT Treatment Interventions DME instruction;Gait training;Functional mobility training;Therapeutic activities;Therapeutic exercise;Balance training;Neuromuscular re-education;Cognitive remediation;Patient/family education   PT Goals (Current goals can be found in the Care Plan section) Acute Rehab PT Goals Patient Stated Goal: Did not set on evaluation PT Goal Formulation: With patient Time For Goal Achievement: 09/04/13 Potential to Achieve Goals: Good    Frequency Min 4X/week   Barriers to discharge        Co-evaluation               End of Session Equipment Utilized During Treatment: Gait belt Activity Tolerance: Patient limited by fatigue Patient left: in chair;with call bell/phone within reach Nurse Communication: Mobility status;Precautions         Time: 0800-0825 PT Time Calculation (min): 25 min   Charges:   PT Evaluation $Initial PT Evaluation Tier I: 1 Procedure PT Treatments $Therapeutic Activity: 8-22 mins   PT G CodesLaymond [facility] 08/21/2013, 11:27 AM Antoine Poche, PT DPT [phone]

## 2013-08-21 NOTE — Progress Notes (Signed)
PULMONARY / CRITICAL CARE MEDICINE  Name: [name] MRN: [id number] DOB: 11-20-1955    ADMISSION DATE:  08/18/2013 CONSULTATION DATE:  08/18/2013  REFERRING MD :  Kathyrn Sheriff PRIMARY SERVICE:  PCCM  CHIEF COMPLAINT:  ICH s/p emergent posterior fossa decompression  BRIEF PATIENT DESCRIPTION: 58 yo with HTN brought to ED with sudden onset severe headache and was intubated for airway protection. CT revealed a large acute intraparenchymal cerebellar hemorrage with significant mass effect in posterior fossa.  Was taken to the OR emergently for a posterior fossa decompression. PCCM was consulted post op.  SIGNIFICANT EVENTS / STUDIES:  CT head 5/20 >>> large acute intraparenchymal cerebellar hemorrage with significant mass effect in posterior fossa, 4th ventricle almost fully effaced OR 5/20 >>> emergent posterior fossa decompression  LINES / TUBES: OETT 5/20 >>> 5/21 OGT 5/20 >>> 5/21 Foley 5/20 >>> Right IJ CVL 5/20 >>> Left radial A-line 5/20 >>>  5/21  CULTURES:  ANTIBIOTICS:  INTERVAL HISTORY:  Failed bedside swallow, awaiting MBS.  Otherwise no issues.  Off cardene gtt.  Pt c/o drowsy.   VITAL SIGNS: Temp:  [97.6 F (36.4 C)-98.8 F (37.1 C)] 97.6 F (36.4 C) (05/23 0800) Pulse Rate:  [79-94] 79 (05/23 0800) Resp:  [14-23] 16 (05/23 0800) BP: (103-155)/(73-95) 127/73 mmHg (05/23 0800) SpO2:  [98 %-100 %] 99 % (05/23 0800) Weight:  [186 lb 8.2 oz (84.6 kg)] 186 lb 8.2 oz (84.6 kg) (05/23 0500)  HEMODYNAMICS:   VENTILATOR SETTINGS:    INTAKE / OUTPUT: Intake/Output     05/22 0701 - 05/23 0700 05/23 0701 - 05/24 0700   I.V. (mL/kg) 1725 (20.4) 225 (2.7)   Total Intake(mL/kg) 1725 (20.4) 225 (2.7)   Urine (mL/kg/hr) 3350 (1.6)    Total Output 3350     Net -1625 +225         PHYSICAL EXAMINATION: General: Comfortable, no distress, OOB in chair  Neuro: Sleepy but arouses to stimulation, follows commands, appropriate HEENT: PERRL Cardiovascular: Regular, no  distress Lungs: resps even non labored on RA, CTAB Abdomen: Soft, non tender, bowel sounds present Musculoskeletal: No edema.  Skin: No rash  LABS: CBC  Recent Labs Lab 08/19/13 0500 08/20/13 0500 08/21/13 0500  WBC 8.9 12.8* 8.8  HGB 14.0 13.0 12.7  HCT 41.7 39.5 38.5  PLT 104* 98* 99*   Coag's  Recent Labs Lab 08/18/13 1341  APTT 29  INR 1.05   BMET  Recent Labs Lab 08/19/13 0500 08/20/13 0500 08/21/13 0500  NA 140 140 141  K 4.0 4.0 3.7  CL 105 107 104  CO2 24 27 28   BUN 8 10 12   CREATININE 0.55 0.54 0.56  GLUCOSE 158* 109* 75   Electrolytes  Recent Labs Lab 08/19/13 0500 08/20/13 0500 08/21/13 0500  CALCIUM 8.6 9.0 9.0  MG 1.7  --   --   PHOS 3.4  --   --    Sepsis Markers No results found for this basename: LATICACIDVEN, PROCALCITON, O2SATVEN,  in the last 168 hours ABG  Recent Labs Lab 08/19/13 0312  PHART 7.398  PCO2ART 40.5  PO2ART 119.0*   Liver Enzymes  Recent Labs Lab 08/18/13 1341 08/19/13 0500  AST 126* 117*  ALT 120* 110*  ALKPHOS 98 85  BILITOT 1.0 1.5*  ALBUMIN 3.2* 2.9*   Cardiac Enzymes No results found for this basename: TROPONINI, PROBNP,  in the last 168 hours Glucose  Recent Labs Lab 08/20/13 1135 08/20/13 1533 08/20/13 2014 08/20/13 2324 08/21/13 0357 08/21/13 0831  GLUCAP 109* 92 92 81 79 79   IMAGING:  Ct Head Wo Contrast  08/19/2013   CLINICAL DATA:  Followup intracranial hemorrhage.  Hypertension.  EXAM: CT HEAD WITHOUT CONTRAST  TECHNIQUE: Contiguous axial images were obtained from the base of the skull through the vertex without intravenous contrast.  COMPARISON:  08/18/2013 head CT.  FINDINGS: Post suboccipital craniectomy with surgical evacuation of posterior fossa hematoma. Small amount of blood right lateral aspect and posterior aspect of the surgical site. Small amount of pneumocephalus. Decreased associated mass effect with less distortion of the fourth ventricle. Right cerebellar tonsil  remains slightly low lying. No hydrocephalus.  Cause of the intracranial hemorrhage is indeterminate. After postoperative changes have cleared, followup contrast-enhanced MR may be considered.  Remote right lenticular nucleus infarct. Small vessel disease type changes. No CT evidence of large acute thrombotic infarct.  IMPRESSION: Interval surgical decompression of posterior fossa hematoma as detailed above.   Electronically Signed   By: Chauncey Cruel M.D.   On: 08/19/2013 12:34   ASSESSMENT / PLAN:  PULMONARY A:   Acute respiratory failure in setting of acute encephalopathy - extubated 5/21 Tobacco use disorder P:   Supplemental oxygen PRN for SpO2>92 Nicotine patch Cont monitor mental status for ability to protect airway - ok for now   CARDIOVASCULAR A:  Hypertensive emergency P:  Goal SBP<160 Cardene gtt off  Change metoprolol to PO  Hydralazine PRN  RENAL A:   No active issues P:   Trend BMP NS@50  -dc once PO intake improves  GASTROINTESTINAL A:   At risk for dysphagia / aspiration GI Px is not indicated Mildly elevated LFT likely secondary to h/o hepatitis P:   NPO SLP eval -  ok for meds in applesauce per SLP D/c Protonix Trend LFT  HEMATOLOGIC A:   VTE Px Thrombocytopenia P:  Trend CBC SCDs  INFECTIOUS A:   No acute issues P:   No intervention required  ENDOCRINE  A:  Hyperglycemia On Prednisone, indication? (started 3/25 for rash) P:   SSI Taper Prednisone to off   NEUROLOGIC A:   ICH s/p craniotomy Acute encephalopathy, resolving P:   Neurology / Neurosurgery following Fentanyl PRN     Nickolas Madrid, NP 08/21/2013  10:52 AM Pager: [phone] or [phone]  PCCM to sign off -call as needed OK to transfer out of ICU  *Care during the described time interval was provided by me and/or other providers on the critical care team. I have reviewed this patient's available data, including medical history, events of note, physical  examination and test results as part of my evaluation.  Kara Mead MD. Shade Flood. [facility] Pager [phone] If no response call [phone]

## 2013-08-21 NOTE — Progress Notes (Signed)
Speech Language Pathology Treatment: Dysphagia  Patient Details Name: [name] MRN: [id number] DOB: September 27, 1955 Today's Date: 08/21/2013 Time: 1000-1020 SLP Time Calculation (min): 20 min  Assessment / Plan / Recommendation Clinical Impression  F/u from BSE to reassess swallow to determine PO readiness.  Continues with outward clinical s/s of aspiration with thin liquid by teaspoon and cup.  Increased wet vocal quality s/p swallow of puree with swallows in succession indicating probable penetration.  Tolerated trial ice chips and puree in limited amounts.  Continues with severe weakness with decreased ability to protect airway fully.  Recommend continued NPO status with exception of medication crushed in puree and ice chips PRN s/p oral care.   Recommend completion of objective evaluation of MBS prior to initiating PO diet.  MBS to be completed 08/22/13.      HPI HPI: 58 year old female admitted 08/18/13 due to AMS with headache, fall and slurred speech. Pt intubated 5/20-21/15. PMH significant for HepC, PNA, HTN. CT revealed large posterior fossa hematoma with compression of the medulla/pons and effacement of the 4th ventricle  Pt underwent posterior fossa decompression. BSE ordered to evaluate po readiness.       SLP Plan  MBS;New goals to be determined pending instrumental study    Recommendations Diet recommendations: NPO;Other(comment) (Ice chips PRN s/p oral care ) Medication Administration: Crushed with puree              Oral Care Recommendations: Oral care Q4 per protocol;Oral care prior to ice chips Follow up Recommendations: Inpatient Rehab Plan: MBS;New goals to be determined pending instrumental study    Doniphan Aguanga, [place] 08/21/2013, 10:25 AM

## 2013-08-21 NOTE — Progress Notes (Signed)
Patient ID: Simona Huh, female   DOB: 05/05/1955, 58 y.o.   MRN: [id number] Subjective:  The patient is alert and pleasant. She is in no apparent distress.  Objective: Vital signs in last 24 hours: Temp:  [97.6 F (36.4 C)-98.8 F (37.1 C)] 97.6 F (36.4 C) (05/23 0800) Pulse Rate:  [79-94] 79 (05/23 0800) Resp:  [14-22] 16 (05/23 0800) BP: (103-155)/(73-95) 127/73 mmHg (05/23 0800) SpO2:  [98 %-100 %] 99 % (05/23 0800) Weight:  [84.6 kg (186 lb 8.2 oz)] 84.6 kg (186 lb 8.2 oz) (05/23 0500)  Intake/Output from previous day: 05/22 0701 - 05/23 0700 In: 1725 [I.V.:1725] Out: 3350 [Urine:3350] Intake/Output this shift: Total I/O In: 225 [I.V.:225] Out: -   Physical exam the patient is alert and oriented x2. Glasgow Coma Scale 14. She is moving all 4 extremities well. Her dressing is clean and dry.  Lab Results:  Recent Labs  08/20/13 0500 08/21/13 0500  WBC 12.8* 8.8  HGB 13.0 12.7  HCT 39.5 38.5  PLT 98* 99*   BMET  Recent Labs  08/20/13 0500 08/21/13 0500  NA 140 141  K 4.0 3.7  CL 107 104  CO2 27 28  GLUCOSE 109* 75  BUN 10 12  CREATININE 0.54 0.56  CALCIUM 9.0 9.0    Studies/Results: No results found.  Assessment/Plan: Postop day #3: The patient is improving neurologically.  LOS: 3 days     Ophelia Charter 08/21/2013, 1:00 PM

## 2013-08-22 ENCOUNTER — Inpatient Hospital Stay (HOSPITAL_COMMUNITY): Payer: Medicaid Other

## 2013-08-22 LAB — TYPE AND SCREEN
ABO/RH(D): AB POS
Antibody Screen: POSITIVE
DAT, IgG: NEGATIVE
Donor AG Type: NEGATIVE
Donor AG Type: NEGATIVE
UNIT DIVISION: 0
Unit division: 0

## 2013-08-22 LAB — GLUCOSE, CAPILLARY
GLUCOSE-CAPILLARY: 108 mg/dL — AB (ref 70–99)
GLUCOSE-CAPILLARY: 122 mg/dL — AB (ref 70–99)
Glucose-Capillary: 130 mg/dL — ABNORMAL HIGH (ref 70–99)
Glucose-Capillary: 93 mg/dL (ref 70–99)
Glucose-Capillary: 96 mg/dL (ref 70–99)
Glucose-Capillary: 149 mg/dL — ABNORMAL HIGH (ref 70–99)

## 2013-08-22 LAB — CBC
HEMATOCRIT: 37.1 % (ref 36.0–46.0)
HEMOGLOBIN: 12.8 g/dL (ref 12.0–15.0)
MCH: 36.6 pg — ABNORMAL HIGH (ref 26.0–34.0)
MCHC: 34.5 g/dL (ref 30.0–36.0)
MCV: 106 fL — AB (ref 78.0–100.0)
Platelets: 119 10*3/uL — ABNORMAL LOW (ref 150–400)
RBC: 3.5 MIL/uL — ABNORMAL LOW (ref 3.87–5.11)
RDW: 12.2 % (ref 11.5–15.5)
WBC: 8.1 10*3/uL (ref 4.0–10.5)

## 2013-08-22 LAB — COMPREHENSIVE METABOLIC PANEL
ALBUMIN: 2.5 g/dL — AB (ref 3.5–5.2)
ALK PHOS: 73 U/L (ref 39–117)
ALT: 88 U/L — ABNORMAL HIGH (ref 0–35)
AST: 88 U/L — AB (ref 0–37)
BUN: 11 mg/dL (ref 6–23)
CO2: 30 mEq/L (ref 19–32)
Calcium: 9.1 mg/dL (ref 8.4–10.5)
Chloride: 103 mEq/L (ref 96–112)
Creatinine, Ser: 0.54 mg/dL (ref 0.50–1.10)
GFR calc Af Amer: >90 mL/min (ref >90)
GFR calc non Af Amer: >90 mL/min (ref >90)
Glucose, Bld: 89 mg/dL (ref 70–99)
POTASSIUM: 3.4 meq/L — AB (ref 3.7–5.3)
SODIUM: 141 meq/L (ref 137–147)
Total Bilirubin: 1.5 mg/dL — ABNORMAL HIGH (ref 0.3–1.2)
Total Protein: 6.2 g/dL (ref 6.0–8.3)

## 2013-08-22 NOTE — Procedures (Addendum)
Objective Swallowing Evaluation: Modified Barium Swallowing Study  Patient Details  Name: DAWNE CASALI MRN: [id number] Date of Birth: [birth year]  Today's Date: 08/22/2013 Time: 1100-1130 SLP Time Calculation (min): 30 min  Past Medical History:  Past Medical History  Diagnosis Date  . Hepatitis C   . Pneumonia   . Hypertension    Past Surgical History:  Past Surgical History  Procedure Laterality Date  . Neck surgery    . Shoulder surgery Bilateral   . Craniectomy N/A 08/18/2013    Procedure: CRANIECTOMY POSTERIOR FOSSA DECOMPRESSION, EVACUATION OF HEMORRHAGE;  Surgeon: Consuella Lose, MD;  Location: [facility];  Service: Neurosurgery;  Laterality: N/A;   HPI:  58 year old female admitted 08/18/13 due to AMS with headache, fall and slurred speech. Pt intubated 5/20-21/15. PMH significant for HepC, PNA, HTN. CT revealed large posterior fossa hematoma with compression of the medulla/pons and effacement of the 4th ventricle  Pt underwent posterior fossa decompression.   MBS indicated to assess risk for aspiration and determine safest, PO diet.       Assessment / Plan / Recommendation Clinical Impression  Dysphagia Diagnosis: Moderate oral phase dysphagia;Moderate pharyngeal phase dysphagia  Moderate sensory- motor oral dysphagia characterized by delayed oral transit, reduced posterior propulsion, (especially with whole barium tablet) and lingual weakness.  Moderate sensory-motor pharyngeal dysphagia marked by decreased TBR and reduced airway closure.  Suspected mechanical based dysphagia.  Evidence of hardware at C4 to C-7.  Appeared to be diverticulum at level of pyriforms but no radiologist present to confirm.  Pre-spill to pyriforms with thin liquid by cup/straw and nectar by cup in larger amounts.  Moderate amount of aspiration occurred during the swallow with thin liquid by cup/straw with no sensation.  Instructed cough and throat clear ineffective to clear aspirated material.   Once instance of aspiration during swallow with nectar by cup due to patient taking too large of amounts with swallows in succession in attempts to transit whole barium tablet.  Cognitively unable to complete any compensatory strategy.    Recommend to proceed with dysphagia 2 (finely chopped) and nectar thick liquids by cup sips only.  Total assist with all meals as aspiration risk remains secondary to decreased safety awareness.  Continued ST in acute care setting for diet consistency management.  Repeat MBS in 3 to 5 days with clinical improvement prior to liquid upgrade.   Treatment Recommendation  F/U MBS in __3-5 days with clinical improvement.  Diagnostic treatment completed following evaluation focusing on providing caregivers diet recommendations and use of aspiration precautions.     Diet Recommendation Dysphagia 2 (Fine chop);Nectar-thick liquid   Liquid Administration via: Cup;No straw Medication Administration: Crushed with puree Supervision: Full supervision/cueing for compensatory strategies Compensations: Slow rate;Small sips/bites;Multiple dry swallows after each bite/sip Postural Changes and/or Swallow Maneuvers: Out of bed for meals;Seated upright 90 degrees;Upright 30-60 min after meal    Other  Recommendations Oral Care Recommendations: Oral care Q4 per protocol Other Recommendations: Order thickener from pharmacy;Other (Comment);Have oral suction available;Clarify dietary restrictions;Prohibited food (jello, ice cream, thin soups);Remove water pitcher   Follow Up Recommendations  Inpatient Rehab    Frequency and Duration min 2x/week  2 weeks       SLP Swallow Goals     General Date of Onset: 08/18/13 HPI: 58 year old female admitted 08/18/13 due to AMS with headache, fall and slurred speech. Pt intubated 5/20-21/15. PMH significant for HepC, PNA, HTN. CT revealed large posterior fossa hematoma with compression of the medulla/pons  and effacement of the 4th ventricle   Pt underwent posterior fossa decompression. BSE ordered to evaluate po readiness.  Type of Study: Modified Barium Swallowing Study Reason for Referral: Objectively evaluate swallowing function Previous Swallow Assessment: BSE 08/20/13 NPO  Diet Prior to this Study: NPO Temperature Spikes Noted: No Respiratory Status: Room air History of Recent Intubation: Yes Length of Intubations (days): 2 days Date extubated: 08/19/13 Behavior/Cognition: Alert;Cooperative;Confused;Distractible;Requires cueing;Decreased sustained attention Oral Cavity - Dentition: Edentulous Oral Motor / Sensory Function: Impaired - see Bedside swallow eval Self-Feeding Abilities: Able to feed self;Needs assist Patient Positioning: Upright in chair Baseline Vocal Quality: Clear Volitional Cough: Weak Volitional Swallow: Able to elicit Anatomy: Within functional limits Pharyngeal Secretions: Not observed secondary MBS    Reason for Referral Objectively evaluate swallowing function   Oral Phase Oral Preparation/Oral Phase Oral Phase: Impaired Oral - Nectar Oral - Nectar Teaspoon: Incomplete tongue to palate contact;Reduced posterior propulsion;Weak lingual manipulation;Piecemeal swallowing;Delayed oral transit Oral - Nectar Cup: Incomplete tongue to palate contact;Piecemeal swallowing;Reduced posterior propulsion;Weak lingual manipulation;Delayed oral transit Oral - Thin Oral - Thin Teaspoon: Weak lingual manipulation;Incomplete tongue to palate contact;Piecemeal swallowing Oral - Thin Cup: Incomplete tongue to palate contact;Reduced posterior propulsion;Delayed oral transit Oral - Solids Oral - Puree: Weak lingual manipulation;Piecemeal swallowing;Incomplete tongue to palate contact Oral - Mechanical Soft: Weak lingual manipulation;Delayed oral transit;Reduced posterior propulsion;Piecemeal swallowing;Incomplete tongue to palate contact Oral - Pill: Weak lingual manipulation;Delayed oral transit;Reduced posterior  propulsion;Incomplete tongue to palate contact   Pharyngeal Phase Pharyngeal Phase Pharyngeal Phase: Impaired Pharyngeal - Nectar Pharyngeal - Nectar Cup: Reduced pharyngeal peristalsis;Reduced tongue base retraction;Trace aspiration;Reduced airway/laryngeal closure;Premature spillage to valleculae;Reduced laryngeal elevation;Penetration/Aspiration during swallow Penetration/Aspiration details (nectar cup): Material enters airway, CONTACTS cords then ejected out Pharyngeal - Thin Pharyngeal - Thin Teaspoon: Reduced pharyngeal peristalsis;Reduced tongue base retraction;Reduced laryngeal elevation;Reduced airway/laryngeal closure Pharyngeal - Thin Cup: Reduced tongue base retraction;Reduced pharyngeal peristalsis;Moderate aspiration;Penetration/Aspiration during swallow;Reduced laryngeal elevation;Reduced airway/laryngeal closure;Premature spillage to pyriform sinuses Penetration/Aspiration details (thin cup): Material enters airway, passes BELOW cords without attempt by patient to eject out (silent aspiration) Pharyngeal - Thin Straw: Moderate aspiration;Reduced tongue base retraction;Reduced pharyngeal peristalsis;Reduced laryngeal elevation;Reduced airway/laryngeal closure;Penetration/Aspiration during swallow;Premature spillage to pyriform sinuses Pharyngeal - Solids Pharyngeal - Puree: Reduced pharyngeal peristalsis;Reduced tongue base retraction;Reduced laryngeal elevation;Reduced airway/laryngeal closure Pharyngeal - Mechanical Soft: Reduced tongue base retraction;Reduced pharyngeal peristalsis;Reduced laryngeal elevation;Reduced airway/laryngeal closure  Cervical Esophageal Phase    GO    Cervical Esophageal Phase Cervical Esophageal Phase: [place] Nadea Kirkland 08/22/2013, 3:12 PM

## 2013-08-22 NOTE — Progress Notes (Signed)
Stroke Team Progress Note  HISTORY [name] is an 58 y.o. female who took a shower and then suddenly had a sever HA (worst HA of her life) then fell down on 08/18/2013 at 1300. There is question of loss consciousness. EMS was called and patient was transported to ER as Code Stroke. While in route she had emisis and SBP was 230. CT head showed a large cerebellar bleed. Patient was brought back to ER, intubated and Neurosurgery was consulted for possible immediate evacuation. Patient was given 5 grams mannitol and Cardiene was started to being BP down. Neurosurgery arrived and agreed to take patient for posterior decompression. Patient was not administered TPA secondary to hemorrhage. She was admitted post op to the neuro ICU for further evaluation and treatment.  SUBJECTIVE No family at bedside. Patient alert and responsive. Per RN, no overnight events. Discussed case with Neurosurgery, Dr. Rita Ohara  OBJECTIVE Most recent Vital Signs: Filed Vitals:   08/22/13 0500 08/22/13 0600 08/22/13 0700 08/22/13 0800  BP:  140/87 142/94 137/88  Pulse:  75 71 71  Temp:      TempSrc:      Resp:  17 14 16   Height:      Weight: 179 lb 10.8 oz (81.5 kg)     SpO2:  92% 95% 96%   CBG (last 3)   Recent Labs  08/21/13 2116 08/21/13 2323 08/22/13 0446  GLUCAP 99 130* 93    IV Fluid Intake:   . sodium chloride 30 mL/hr at 08/21/13 1435    MEDICATIONS  . insulin aspart  0-15 Units Subcutaneous 6 times per day  . mannitol  25 g Intravenous Once  . metoprolol tartrate  25 mg Oral BID  . nicotine  14 mg Transdermal Daily  . ofloxacin  1 drop Both Eyes QID  . predniSONE  30 mg Oral Q breakfast  . senna-docusate  1 tablet Oral BID   PRN:  sodium chloride, acetaminophen, acetaminophen, fentaNYL, hydrALAZINE  Diet:  NPO  Activity:  Out of bed with assistance DVT Prophylaxis:  SCDs   CLINICALLY SIGNIFICANT STUDIES Basic Metabolic Panel:   Recent Labs Lab 08/18/13 1345 08/19/13 0500   08/21/13 0500 08/22/13 0235  NA 143 140  < > 141 141  K 3.5* 4.0  < > 3.7 3.4*  CL 104 105  < > 104 103  CO2  --  24  < > 28 30  GLUCOSE 175* 158*  < > 75 89  BUN 8 8  < > 12 11  CREATININE 0.70 0.55  < > 0.56 0.54  CALCIUM  --  8.6  < > 9.0 9.1  MG  --  1.7  --   --   --   PHOS  --  3.4  --   --   --   < > = values in this interval not displayed. Liver Function Tests:   Recent Labs Lab 08/19/13 0500 08/22/13 0235  AST 117* 88*  ALT 110* 88*  ALKPHOS 85 73  BILITOT 1.5* 1.5*  PROT 6.4 6.2  ALBUMIN 2.9* 2.5*   CBC:   Recent Labs Lab 08/18/13 1341  08/21/13 0500 08/22/13 0235  WBC 8.2  < > 8.8 8.1  NEUTROABS 2.6  --   --   --   HGB 14.6  < > 12.7 12.8  HCT 42.3  < > 38.5 37.1  MCV 106.0*  < > 108.8* 106.0*  PLT 131*  < > 99* 119*  < > =  values in this interval not displayed. Coagulation:   Recent Labs Lab 08/18/13 1341  LABPROT 13.5  INR 1.05   Cardiac Enzymes: No results found for this basename: CKTOTAL, CKMB, CKMBINDEX, TROPONINI,  in the last 168 hours Urinalysis:   Recent Labs Lab 08/18/13 1410  COLORURINE YELLOW  LABSPEC 1.012  PHURINE 7.0  GLUCOSEU 100*  HGBUR NEGATIVE  BILIRUBINUR NEGATIVE  KETONESUR NEGATIVE  PROTEINUR NEGATIVE  UROBILINOGEN 1.0  NITRITE NEGATIVE  LEUKOCYTESUR NEGATIVE   Lipid Panel    Component Value Date/Time   CHOL 114 03/30/2013 1624   TRIG 84 08/18/2013 1931   HDL 39* 03/30/2013 1624   CHOLHDL 2.9 03/30/2013 1624   VLDL 17 03/30/2013 1624   LDLCALC 58 03/30/2013 1624   HgbA1C  Lab Results  Component Value Date   HGBA1C 5.0 03/30/2013    Urine Drug Screen:     Component Value Date/Time   LABOPIA NONE DETECTED 08/18/2013 1410   COCAINSCRNUR POSITIVE* 08/18/2013 1410   LABBENZ NONE DETECTED 08/18/2013 1410   AMPHETMU NONE DETECTED 08/18/2013 1410   THCU NONE DETECTED 08/18/2013 1410   LABBARB NONE DETECTED 08/18/2013 1410    Alcohol Level:   Recent Labs Lab 08/18/13 1341  ETH 23*    Mr Abdomen W Wo  Contrast  08/17/2013   1. Cysts within the right kidney have imaging and enhancement characteristics compatible of benign Bosniak category 2 cysts. 2. Bosniak category 1 and 2 cysts within the left kidney. 3. Left adrenal adenoma.      CT of the brain   08/19/2013   Interval surgical decompression of posterior fossa hematoma as detailed above.    08/18/2013    Large acute intraparenchymal cerebellar hemorrhage with significant mass effect in the posterior fossa and probable tonsillar herniation. Fourth ventricle appears nearly completely effaced. Cannot exclude early hydrocephalus.   MRI of the brain    MRA of the brain    2D Echocardiogram    Carotid Doppler    CXR   08/19/2013   Minimal bibasilar atelectasis, stable.    08/18/2013    Right internal jugular catheter to the level superior vena cava. No pneumothorax.  Minimal atelectasis versus scarring left lung base.  Otherwise stable chest radiograph.    08/18/2013    Endotracheal tube in appropriate position.   Electronically Signed   By: Suzy Bouchard M.D.   On: 08/18/2013 15:16   EKG  normal sinus rhythm. For complete results please see formal report.   Therapy Recommendations   Physical Exam   Middle aged lady. Afebrile. Head is nontraumatic. Neck is supple without bruit.    Cardiac exam no murmur or gallop. Lungs are clear to auscultation. Distal pulses are well felt. Neurological Exam : awak alert oriented to name, Zacarias Pontes and May 2015. Severe dysarthria. Follows gaze in all directions. No nystagmus. Blinks to threat bilaterally. Fundi were not visualized. Vision acuity cannot be reliably tested. Follows commands consistently in all 4 extremities. Good antigravity strength on the right. Able to move left side but mild weakness compared to the right. Deep tendon reflexes symmetric. Plantars are downgoing.  ASSESSMENT Ms. [name] is a 58 y.o. female presenting with with the worst headache of her life. Imaging confirms a  large cerebellar hemorrhage with mass effect and tonsillar herniation, 4th vertical effaced. Status post decompressive craniotomy per Dr. Kathyrn Sheriff. Hemorrhage secondary to malginant hypertension as a result of cocaine use with SBP > 230 on arrival. On no antithrombotics prior to admission.  Stroke work up underway.   VDRF, extubated 5/21  Malignant hypertension  cardene drip d/c, home meds resumed though tube removed at time of extubation, now on IV metoprolol scheduled  BP 130-140s  Hepatitis C  Cigarette smoker  etoh use, positive on admission  Cocaine use, positive on admission  On prednisone at home  Hospital day # 4  TREATMENT/PLAN  Per discussion with NSX, will keep in ICU over the weekend  SBP goal < 180  OOB. Therapy evals, NPO, possible MBS today  Off cardene. Resume home BP meds if passes swallow eval, currently on scheduled IV metoprolol  Ask patient about prednisone; consider decreased dose/stopping  Currently on ICP for DVT ppx, per discussion with Dr Rita Ohara will hold off on Lovenox at this time due to increased risk of hemorrhage  If change in clinical status will repeat head CT  Tapering down prednisone  This patient is critically ill and at significant risk of neurological worsening, death and care requires constant monitoring of vital signs, hemodynamics,respiratory and cardiac monitoring,review of multiple databases, neurological assessment, discussion with family, other specialists and medical decision making of high complexity. I spent 35 minutes of neurocritical care time  in the care of  this patient.   Jim Like, DO [facility] Pager: [phone]   To contact Stroke Continuity provider, please refer to [email or web address] After hours, contact General Neurology

## 2013-08-22 NOTE — Progress Notes (Signed)
Subjective: Patient resting in bed, comfortable. Nursing staff and speech therapy suspects dysphagia, patient is scheduled for modified barium swallow today.  Objective: Vital signs in last 24 hours: Filed Vitals:   08/22/13 0500 08/22/13 0600 08/22/13 0700 08/22/13 0800  BP:  140/87 142/94 137/88  Pulse:  75 71 71  Temp:    97.9 F (36.6 C)  TempSrc:    Oral  Resp:  17 14 16   Height:      Weight: 81.5 kg (179 lb 10.8 oz)     SpO2:  92% 95% 96%    Intake/Output from previous day: 05/23 0701 - 05/24 0700 In: 1185 [I.V.:1185] Out: 2400 [Urine:2400] Intake/Output this shift: Total I/O In: 30 [I.V.:30] Out: 60 [Urine:60]  Physical Exam:  Awake and alert, oriented. Following commands. Moving all 4 extremities well. EOMI. Speech dysarthric. Dressing clean and dry.  CBC  Recent Labs  08/21/13 0500 08/22/13 0235  WBC 8.8 8.1  HGB 12.7 12.8  HCT 38.5 37.1  PLT 99* 119*   BMET  Recent Labs  08/21/13 0500 08/22/13 0235  NA 141 141  K 3.7 3.4*  CL 104 103  CO2 28 30  GLUCOSE 75 89  BUN 12 11  CREATININE 0.56 0.54  CALCIUM 9.0 9.1    Assessment/Plan: Doing well from a neurosurgical and neurologic perspective. Agree that dysphagia is likely in this dysarthric patient. Agree with modified barium swallow.  Case discussed with Dr. Jim Like from neurology. I do not favor pharmacologic DVT prophylaxis at this time, solely SCDs.   Hosie Spangle, MD 08/22/2013, 9:20 AM

## 2013-08-22 NOTE — Progress Notes (Signed)
Rehab Admissions Coordinator Note:  Patient was screened by Cleatrice Burke for appropriateness for an Inpatient Acute Rehab Consult per PT recommendation. At this time, we are recommending Inpatient Rehab consult. Please place order.  Audelia Acton [facility] 08/22/2013, 10:44 AM  I can be reached at [phone].

## 2013-08-23 DIAGNOSIS — I619 Nontraumatic intracerebral hemorrhage, unspecified: Secondary | ICD-10-CM

## 2013-08-23 LAB — GLUCOSE, CAPILLARY
GLUCOSE-CAPILLARY: 110 mg/dL — AB (ref 70–99)
GLUCOSE-CAPILLARY: 165 mg/dL — AB (ref 70–99)
Glucose-Capillary: 113 mg/dL — ABNORMAL HIGH (ref 70–99)
Glucose-Capillary: 116 mg/dL — ABNORMAL HIGH (ref 70–99)
Glucose-Capillary: 117 mg/dL — ABNORMAL HIGH (ref 70–99)
Glucose-Capillary: 155 mg/dL — ABNORMAL HIGH (ref 70–99)
Glucose-Capillary: 181 mg/dL — ABNORMAL HIGH (ref 70–99)

## 2013-08-23 MED ORDER — HYDROCODONE-ACETAMINOPHEN 5-325 MG PO TABS
1.0000 | ORAL_TABLET | ORAL | Status: DC | PRN
  Administered 2013-08-23 – 2013-08-25 (×3): 2 via ORAL
  Filled 2013-08-23 (×3): qty 2

## 2013-08-23 NOTE — Progress Notes (Signed)
Subjective: Patient sitting up in bed, without complaints. Underwent modified barium swallow yesterday, and started on dysphagia 2 diet. Patient seen with Dr. Kathyrn Sheriff.  Objective: Vital signs in last 24 hours: Filed Vitals:   08/23/13 0413 08/23/13 0600 08/23/13 0800 08/23/13 0844  BP:  149/90 135/80   Pulse:  68 67   Temp:    98.1 F (36.7 C)  TempSrc:      Resp:  17 16   Height:      Weight: 81.4 kg (179 lb 7.3 oz)     SpO2:  97% 98%     Intake/Output from previous day: 05/24 0701 - 05/25 0700 In: 720 [I.V.:720] Out: 1110 [Urine:1110] Intake/Output this shift:    Physical Exam:  Awake alert, oriented, following commands. Speech remains dysarthric. Moving all 4 extremity is well. Dressing removed, wound clean and dry.  CBC  Recent Labs  08/21/13 0500 08/22/13 0235  WBC 8.8 8.1  HGB 12.7 12.8  HCT 38.5 37.1  PLT 99* 119*   BMET  Recent Labs  08/21/13 0500 08/22/13 0235  NA 141 141  K 3.7 3.4*  CL 104 103  CO2 28 30  GLUCOSE 75 89  BUN 12 11  CREATININE 0.56 0.54  CALCIUM 9.0 9.1   ABG    Component Value Date/Time   PHART 7.398 08/19/2013 0312   PCO2ART 40.5 08/19/2013 0312   PO2ART 119.0* 08/19/2013 0312   HCO3 24.4* 08/19/2013 0312   TCO2 25.7 08/19/2013 0312   O2SAT 98.3 08/19/2013 0312    Studies/Results: Dg Swallowing Func-speech Pathology  08/22/2013   Chryl Heck Dankof, CCC-SLP     08/22/2013  5:47 PM Objective Swallowing Evaluation: Modified Barium Swallowing Study   Patient Details  Name: [name] MRN: [id number] Date of Birth: 1955/11/29  Today's Date: 08/22/2013 Time: 1100-1130 SLP Time Calculation (min): 30 min  Past Medical History:  Past Medical History  Diagnosis Date  . Hepatitis C   . Pneumonia   . Hypertension    Past Surgical History:  Past Surgical History  Procedure Laterality Date  . Neck surgery    . Shoulder surgery Bilateral   . Craniectomy N/A 08/18/2013    Procedure: CRANIECTOMY POSTERIOR FOSSA DECOMPRESSION,  EVACUATION OF  HEMORRHAGE;  Surgeon: Consuella Lose, MD;   Location: [place] [facility];  Service: Neurosurgery;  Laterality: N/A;    HPI:  58 year old female admitted 08/18/13 due to AMS with headache,  fall and slurred speech. Pt intubated 5/20-21/15. PMH significant  for HepC, PNA, HTN. CT revealed large posterior fossa hematoma  with compression of the medulla/pons and effacement of the 4th  ventricle  Pt underwent posterior fossa decompression.   MBS  indicated to assess risk for aspiration and determine safest, PO  diet.       Assessment / Plan / Recommendation Clinical Impression  Dysphagia Diagnosis: Moderate oral phase dysphagia;Moderate  pharyngeal phase dysphagia  Moderate sensory- motor oral dysphagia characterized by delayed  oral transit, reduced posterior propulsion, (especially with  whole barium tablet) and lingual weakness.  Moderate  sensory-motor pharyngeal dysphagia marked by decreased TBR and  reduced airway closure.  Suspected mechanical based dysphagia.   Evidence of hardware at C4 to C-7.  Appeared to be diverticulum  at level of pyriforms but no radiologist present to confirm.   Pre-spill to pyriforms with thin liquid by cup/straw and nectar  by cup in larger amounts.  Moderate amount of aspiration occurred  during the swallow with thin liquid by  cup/straw with no  sensation.  Instructed cough and throat clear ineffective to  clear aspirated material.  Once instance of aspiration during  swallow with nectar by cup due to patient taking too large of  amounts with swallows in succession in attempts to transit whole  barium tablet.  Cognitively unable to complete any compensatory  strategy.    Recommend to proceed with dysphagia 2 (finely chopped) and  nectar thick liquids by cup sips only.  Total assist with all  meals as aspiration risk remains secondary to decreased safety  awareness.  Continued ST in acute care setting for diet  consistency management.  Repeat MBS in 3 to 5 days with clinical  improvement  prior to liquid upgrade.   Treatment Recommendation  F/U MBS in __3-5 days with clinical improvement.  Diagnostic  treatment completed following evaluation focusing on providing  caregivers diet recommendations and use of aspiration  precautions.     Diet Recommendation Dysphagia 2 (Fine chop);Nectar-thick liquid   Liquid Administration via: Cup;No straw Medication Administration: Crushed with puree Supervision: Full supervision/cueing for compensatory strategies Compensations: Slow rate;Small sips/bites;Multiple dry swallows  after each bite/sip Postural Changes and/or Swallow Maneuvers: Out of bed for  meals;Seated upright 90 degrees;Upright 30-60 min after meal    Other  Recommendations Oral Care Recommendations: Oral care Q4  per protocol Other Recommendations: Order thickener from pharmacy;Other  (Comment);Have oral suction available;Clarify dietary  restrictions;Prohibited food (jello, ice cream, thin  soups);Remove water pitcher   Follow Up Recommendations  Inpatient Rehab    Frequency and Duration min 2x/week  2 weeks       SLP Swallow Goals     General Date of Onset: 08/18/13 HPI: 58 year old female admitted 08/18/13 due to AMS with  headache, fall and slurred speech. Pt intubated 5/20-21/15. PMH  significant for HepC, PNA, HTN. CT revealed large posterior fossa  hematoma with compression of the medulla/pons and effacement of  the 4th ventricle  Pt underwent posterior fossa decompression.  BSE ordered to evaluate po readiness.  Type of Study: Modified Barium Swallowing Study Reason for Referral: Objectively evaluate swallowing function Previous Swallow Assessment: BSE 08/20/13 NPO  Diet Prior to this Study: NPO Temperature Spikes Noted: No Respiratory Status: Room air History of Recent Intubation: Yes Length of Intubations (days): 2 days Date extubated: 08/19/13 Behavior/Cognition:  Alert;Cooperative;Confused;Distractible;Requires cueing;Decreased  sustained attention Oral Cavity - Dentition: Edentulous Oral  Motor / Sensory Function: Impaired - see Bedside swallow  eval Self-Feeding Abilities: Able to feed self;Needs assist Patient Positioning: Upright in chair Baseline Vocal Quality: Clear Volitional Cough: Weak Volitional Swallow: Able to elicit Anatomy: Within functional limits Pharyngeal Secretions: Not observed secondary MBS    Reason for Referral Objectively evaluate swallowing function   Oral Phase Oral Preparation/Oral Phase Oral Phase: Impaired Oral - Nectar Oral - Nectar Teaspoon: Incomplete tongue to palate  contact;Reduced posterior propulsion;Weak lingual  manipulation;Piecemeal swallowing;Delayed oral transit Oral - Nectar Cup: Incomplete tongue to palate contact;Piecemeal  swallowing;Reduced posterior propulsion;Weak lingual  manipulation;Delayed oral transit Oral - Thin Oral - Thin Teaspoon: Weak lingual manipulation;Incomplete tongue  to palate contact;Piecemeal swallowing Oral - Thin Cup: Incomplete tongue to palate contact;Reduced  posterior propulsion;Delayed oral transit Oral - Solids Oral - Puree: Weak lingual manipulation;Piecemeal  swallowing;Incomplete tongue to palate contact Oral - Mechanical Soft: Weak lingual manipulation;Delayed oral  transit;Reduced posterior propulsion;Piecemeal  swallowing;Incomplete tongue to palate contact Oral - Pill: Weak lingual manipulation;Delayed oral  transit;Reduced posterior propulsion;Incomplete tongue to palate  contact   Pharyngeal Phase  Pharyngeal Phase Pharyngeal Phase: Impaired Pharyngeal - Nectar Pharyngeal - Nectar Cup: Reduced pharyngeal peristalsis;Reduced  tongue base retraction;Trace aspiration;Reduced airway/laryngeal  closure;Premature spillage to valleculae;Reduced laryngeal  elevation;Penetration/Aspiration during swallow Penetration/Aspiration details (nectar cup): Material enters  airway, CONTACTS cords then ejected out Pharyngeal - Thin Pharyngeal - Thin Teaspoon: Reduced pharyngeal  peristalsis;Reduced tongue base retraction;Reduced  laryngeal  elevation;Reduced airway/laryngeal closure Pharyngeal - Thin Cup: Reduced tongue base retraction;Reduced  pharyngeal peristalsis;Moderate aspiration;Penetration/Aspiration  during swallow;Reduced laryngeal elevation;Reduced  airway/laryngeal closure;Premature spillage to pyriform sinuses Penetration/Aspiration details (thin cup): Material enters  airway, passes BELOW cords without attempt by patient to eject  out (silent aspiration) Pharyngeal - Thin Straw: Moderate aspiration;Reduced tongue base  retraction;Reduced pharyngeal peristalsis;Reduced laryngeal  elevation;Reduced airway/laryngeal closure;Penetration/Aspiration  during swallow;Premature spillage to pyriform sinuses Pharyngeal - Solids Pharyngeal - Puree: Reduced pharyngeal peristalsis;Reduced tongue  base retraction;Reduced laryngeal elevation;Reduced  airway/laryngeal closure Pharyngeal - Mechanical Soft: Reduced tongue base  retraction;Reduced pharyngeal peristalsis;Reduced laryngeal  elevation;Reduced airway/laryngeal closure  Cervical Esophageal Phase    GO    Cervical Esophageal Phase Cervical Esophageal Phase: Raven [place], CCC-SLP [phone] Marcille Buffy 08/22/2013, 3:12 PM     Assessment/Plan: Patient continued to make good progress. To be transferred to [address]., will place PM and R. consult for CIR.   Hosie Spangle, MD 08/23/2013, 10:24 AM

## 2013-08-23 NOTE — Progress Notes (Signed)
Occupational Therapy Evaluation Patient Details Name: [name] MRN: [id number] DOB: 03/02/1956 Today's Date: 08/23/2013    History of Present Illness 58 year old female admitted 08/18/13 due to AMS with headache, fall and slurred speech. Pt intubated 5/20-21/15. PMH significant for HepC, PNA, HTN. CT revealed large posterior fossa hematoma with compression of the medulla/pons and effacement of the 4th ventricle  Pt underwent posterior fossa decompression   Clinical Impression   PTA, pt lived with daughter and Elenor Legato and was independent with ADL and mobility and did not work. Pt presents with significant ataxia, visual deficits and apparent impulsivity. Unsure of baseline cognition. Pt is excellent candidate for CIR. Pt will benefit from skilled OT services to facilitate D/C to next venue due to below deficits.    Follow Up Recommendations  CIR;Supervision/Assistance - 24 hour    Equipment Recommendations  3 in 1 bedside comode;Tub/shower bench    Recommendations for Other Services Rehab consult     Precautions / Restrictions Precautions Precautions: Fall Precaution Comments: impulsive at times      Mobility Bed Mobility                  Transfers Overall transfer level: Needs assistance   Transfers: Sit to/from Stand;Stand Pivot Transfers Sit to Stand: Mod assist Stand pivot transfers: Mod assist       General transfer comment: impulsive during transfers. Decreased awareness of position of R foot. significant ataxia Improved performance with increased postural control and increased tactile input.   Balance Overall balance assessment: Needs assistance Sitting-balance support: Feet supported;Bilateral upper extremity supported Sitting balance-Leahy Scale: Poor Sitting balance - Comments: trunkal ataxia Postural control: Other (comment) (ataxic) Standing balance support: During functional activity;Bilateral upper extremity supported Standing balance-Leahy  Scale: Poor                              ADL Overall ADL's : Needs assistance/impaired Eating/Feeding: Minimal assistance;Sitting Eating/Feeding Details (indicate cue type and reason): cues for rate/bite size and to use L hand. Pt continued to try to use non-dominate (per Pt), ataxic R hand. Pt appeared to be falling asleep while eating. Pt states she always closes her eyes.  Grooming: Moderate assistance;Cueing for safety   Upper Body Bathing: Moderate assistance;Sitting   Lower Body Bathing: Moderate assistance;Cueing for safety   Upper Body Dressing : Moderate assistance   Lower Body Dressing: Maximal assistance   Toilet Transfer: Moderate assistance;Stand-pivot   Toileting- Clothing Manipulation and Hygiene: Maximal assistance       Functional mobility during ADLs: +2 for physical assistance;Maximal assistance (for ambulation. Mod A with stand pivot transfers) General ADL Comments: affected significantly by ataxia     Vision Eye Alignment: Within Functional Limits Alignment/Gaze Preference: Within Defined Limits Ocular Range of Motion: Within Functional Limits Tracking/Visual Pursuits: Decreased smoothness of horizontal tracking;Decreased smoothness of vertical tracking Saccades: Additional eye shifts occurred during testing;Decreased speed of saccadic movement;Impaired - to be further tested in functional context Convergence: Within functional limits   Depth Perception: Overshoots Additional Comments: + nystagmus noted   Perception Perception Perception Tested?: No   Praxis      Pertinent Vitals/Pain VSS No c/o pain     Hand Dominance Left   Extremity/Trunk Assessment Upper Extremity Assessment Upper Extremity Assessment: RUE deficits/detail RUE Deficits / Details: RUE ataxia RUE Sensation: decreased proprioception RUE Coordination: decreased fine motor;decreased gross motor   Lower Extremity Assessment Lower Extremity Assessment: Defer to PT  evaluation RLE Coordination: decreased gross motor LLE Coordination: decreased gross motor   Cervical / Trunk Assessment Cervical / Trunk Assessment: Other exceptions Cervical / Trunk Exceptions: trunkal ataxia   Communication Communication Communication: Expressive difficulties   Cognition Arousal/Alertness: Lethargic Behavior During Therapy: Flat affect;Impulsive (impulsive at times with mobility) Overall Cognitive Status: Impaired/Different from baseline Area of Impairment: Problem solving;Attention;Safety/judgement;Awareness   Current Attention Level: Selective     Safety/Judgement: Decreased awareness of safety;Decreased awareness of deficits Awareness: Emergent Problem Solving: Slow processing;Difficulty sequencing;Requires tactile cues General Comments: unsure of baseline cognition. Pt has history of drug abuse   General Comments       Exercises       Shoulder Instructions      Home Living Family/patient expects to be discharged to:: Private residence Living Arrangements: Other relatives Available Help at Discharge: Family Type of Home: Apartment [place]) Home Access: Stairs to enter Technical brewer of Steps: 3   Home Layout: One level     Bathroom Shower/Tub: Tub/shower unit Shower/tub characteristics: Architectural technologist: Standard Bathroom Accessibility: Yes How Accessible: Accessible via walker Home Equipment: None          Prior Functioning/Environment Level of Independence: Independent             OT Diagnosis: Generalized weakness;Cognitive deficits;Disturbance of vision;Acute pain;Ataxia   OT Problem List: Decreased activity tolerance;Impaired balance (sitting and/or standing);Impaired vision/perception;Decreased coordination;Decreased cognition;Decreased safety awareness;Decreased knowledge of use of DME or AE;Decreased knowledge of precautions;Impaired sensation;Obesity;Impaired UE functional use;Pain   OT  Treatment/Interventions: Self-care/ADL training;Therapeutic exercise;Neuromuscular education;DME and/or AE instruction;Therapeutic activities;Cognitive remediation/compensation;Visual/perceptual remediation/compensation;Patient/family education;Balance training    OT Goals(Current goals can be found in the care plan section) Acute Rehab OT Goals Patient Stated Goal: to drive again OT Goal Formulation: With patient Time For Goal Achievement: 09/06/13 Potential to Achieve Goals: Good  OT Frequency: Min 3X/week   Barriers to D/C:            Co-evaluation              End of Session Equipment Utilized During Treatment: Gait belt Nurse Communication: Mobility status;Other (comment)  Activity Tolerance: Patient tolerated treatment well Patient left: in chair;with call bell/phone within reach;with chair alarm set   Time: [phone] OT Time Calculation (min): 40 min Charges:  OT General Charges $OT Visit: 1 Procedure OT Evaluation $Initial OT Evaluation Tier I: 1 Procedure OT Treatments $Self Care/Home Management : 23-37 mins $Therapeutic Activity: 8-22 mins G-Codes:    Roney Jaffe Rebecka Oelkers 09/11/13, 2:57 PM   [facility], OTR/L  [phone] 09/11/13

## 2013-08-23 NOTE — Progress Notes (Signed)
Stroke Team Progress Note  HISTORY Laiza Jenetta Downer Paige is an 58 y.o. female who took a shower and then suddenly had a sever HA (worst HA of her life) then fell down on 08/18/2013 at 1300. There is question of loss consciousness. EMS was called and patient was transported to ER as Code Stroke. While in route she had emisis and SBP was 230. CT head showed a large cerebellar bleed. Patient was brought back to ER, intubated and Neurosurgery was consulted for possible immediate evacuation. Patient was given 5 grams mannitol and Cardiene was started to being BP down. Neurosurgery arrived and agreed to take patient for posterior decompression. Patient was not administered TPA secondary to hemorrhage. She was admitted post op to the neuro ICU for further evaluation and treatment.  SUBJECTIVE No family at bedside. Patient alert and responsive. Per RN, no overnight events. Discussed case with Neurosurgery, Dr. Rita Ohara. Patient had MBS on 5/24, started on dysphagia 2 diet  OBJECTIVE Most recent Vital Signs: Filed Vitals:   08/23/13 0400 08/23/13 0413 08/23/13 0600 08/23/13 0844  BP: 137/89  149/90   Pulse: 70  68   Temp: 98.6 F (37 C)   98.1 F (36.7 C)  TempSrc: Oral     Resp: 16  17   Height:      Weight:  179 lb 7.3 oz (81.4 kg)    SpO2: 98%  97%    CBG (last 3)   Recent Labs  08/23/13 0110 08/23/13 0338 08/23/13 0843  GLUCAP 110* 155* 116*    IV Fluid Intake:   . sodium chloride 30 mL/hr at 08/21/13 1435    MEDICATIONS  . insulin aspart  0-15 Units Subcutaneous 6 times per day  . metoprolol tartrate  25 mg Oral BID  . nicotine  14 mg Transdermal Daily  . ofloxacin  1 drop Both Eyes QID  . predniSONE  30 mg Oral Q breakfast  . senna-docusate  1 tablet Oral BID   PRN:  acetaminophen, acetaminophen, fentaNYL, hydrALAZINE  Diet:  Dysphagia  Activity:  Out of bed with assistance DVT Prophylaxis:  SCDs   CLINICALLY SIGNIFICANT STUDIES Basic Metabolic Panel:   Recent Labs Lab  08/18/13 1345 08/19/13 0500  08/21/13 0500 08/22/13 0235  NA 143 140  < > 141 141  K 3.5* 4.0  < > 3.7 3.4*  CL 104 105  < > 104 103  CO2  --  24  < > 28 30  GLUCOSE 175* 158*  < > 75 89  BUN 8 8  < > 12 11  CREATININE 0.70 0.55  < > 0.56 0.54  CALCIUM  --  8.6  < > 9.0 9.1  MG  --  1.7  --   --   --   PHOS  --  3.4  --   --   --   < > = values in this interval not displayed. Liver Function Tests:   Recent Labs Lab 08/19/13 0500 08/22/13 0235  AST 117* 88*  ALT 110* 88*  ALKPHOS 85 73  BILITOT 1.5* 1.5*  PROT 6.4 6.2  ALBUMIN 2.9* 2.5*   CBC:   Recent Labs Lab 08/18/13 1341  08/21/13 0500 08/22/13 0235  WBC 8.2  < > 8.8 8.1  NEUTROABS 2.6  --   --   --   HGB 14.6  < > 12.7 12.8  HCT 42.3  < > 38.5 37.1  MCV 106.0*  < > 108.8* 106.0*  PLT 131*  < >  99* 119*  < > = values in this interval not displayed. Coagulation:   Recent Labs Lab 08/18/13 1341  LABPROT 13.5  INR 1.05   Cardiac Enzymes: No results found for this basename: CKTOTAL, CKMB, CKMBINDEX, TROPONINI,  in the last 168 hours Urinalysis:   Recent Labs Lab 08/18/13 1410  COLORURINE YELLOW  LABSPEC 1.012  PHURINE 7.0  GLUCOSEU 100*  HGBUR NEGATIVE  BILIRUBINUR NEGATIVE  KETONESUR NEGATIVE  PROTEINUR NEGATIVE  UROBILINOGEN 1.0  NITRITE NEGATIVE  LEUKOCYTESUR NEGATIVE   Lipid Panel    Component Value Date/Time   CHOL 114 03/30/2013 1624   TRIG 84 08/18/2013 1931   HDL 39* 03/30/2013 1624   CHOLHDL 2.9 03/30/2013 1624   VLDL 17 03/30/2013 1624   LDLCALC 58 03/30/2013 1624   HgbA1C  Lab Results  Component Value Date   HGBA1C 5.0 03/30/2013    Urine Drug Screen:     Component Value Date/Time   LABOPIA NONE DETECTED 08/18/2013 1410   COCAINSCRNUR POSITIVE* 08/18/2013 1410   LABBENZ NONE DETECTED 08/18/2013 1410   AMPHETMU NONE DETECTED 08/18/2013 1410   THCU NONE DETECTED 08/18/2013 1410   LABBARB NONE DETECTED 08/18/2013 1410    Alcohol Level:   Recent Labs Lab 08/18/13 1341   ETH 23*    Mr Abdomen W Wo Contrast  08/17/2013   1. Cysts within the right kidney have imaging and enhancement characteristics compatible of benign Bosniak category 2 cysts. 2. Bosniak category 1 and 2 cysts within the left kidney. 3. Left adrenal adenoma.      CT of the brain   08/19/2013   Interval surgical decompression of posterior fossa hematoma as detailed above.    08/18/2013    Large acute intraparenchymal cerebellar hemorrhage with significant mass effect in the posterior fossa and probable tonsillar herniation. Fourth ventricle appears nearly completely effaced. Cannot exclude early hydrocephalus.   MRI of the brain    MRA of the brain    2D Echocardiogram    Carotid Doppler    CXR   08/19/2013   Minimal bibasilar atelectasis, stable.    08/18/2013    Right internal jugular catheter to the level superior vena cava. No pneumothorax.  Minimal atelectasis versus scarring left lung base.  Otherwise stable chest radiograph.    08/18/2013    Endotracheal tube in appropriate position.   Electronically Signed   By: Suzy Bouchard M.D.   On: 08/18/2013 15:16   EKG  normal sinus rhythm. For complete results please see formal report.   Therapy Recommendations   Physical Exam   Middle aged lady. Afebrile. Head is nontraumatic. Neck is supple without bruit.    Cardiac exam no murmur or gallop. Lungs are clear to auscultation. Distal pulses are well felt. Neurological Exam : awak alert oriented to name, Zacarias Pontes and May 2015. Severe dysarthria. Follows gaze in all directions. No nystagmus. Blinks to threat bilaterally. Fundi were not visualized. Vision acuity cannot be reliably tested. Follows commands consistently in all 4 extremities. Good antigravity strength on the right. Able to move left side but mild weakness compared to the right. Deep tendon reflexes symmetric. Plantars are downgoing.  ASSESSMENT Ms. JAMAIYA TUNNELL is a 58 y.o. female presenting with with the worst headache  of her life. Imaging confirms a large cerebellar hemorrhage with mass effect and tonsillar herniation, 4th vertical effaced. Status post decompressive craniotomy per Dr. Kathyrn Sheriff. Hemorrhage secondary to malginant hypertension as a result of cocaine use with SBP > 230 on arrival.  On no antithrombotics prior to admission. Stroke work up underway.   VDRF, extubated 5/21  Malignant hypertension  cardene drip d/c, home meds resumed though tube removed at time of extubation, now on IV metoprolol scheduled  BP 130-140s  Hepatitis C  Cigarette smoker  etoh use, positive on admission  Cocaine use, positive on admission  On prednisone at home  Hospital day # 5  TREATMENT/PLAN  Transfer to floor bed today  SBP goal < 180  OOB. Therapy evals, on dysphagia 2 diet  Restarted home metoprolol with dose increased to 25mg  BID  Currently on ICP for DVT ppx, per discussion with Dr Rita Ohara will hold off on Lovenox at this time due to increased risk of hemorrhage  If change in clinical status will repeat head CT  Tapering down prednisone    Jim Like, DO [facility] Pager: [phone]   To contact Stroke Continuity provider, please refer to [email or web address] After hours, contact General Neurology

## 2013-08-23 NOTE — Progress Notes (Signed)
Physical Therapy Treatment Patient Details Name: [name] MRN: [id number] DOB: [birth year] Today's Date: 08/23/2013    History of Present Illness 58 year old female admitted 08/18/13 due to AMS with headache, fall and slurred speech. Pt intubated 5/20-21/15. PMH significant for HepC, PNA, HTN. CT revealed large posterior fossa hematoma with compression of the medulla/pons and effacement of the 4th ventricle  Pt underwent posterior fossa decompression    PT Comments    Patient progressing with mobility, extremely impulsive. Will continue to progress as tolerated.  Follow Up Recommendations  CIR;Supervision/Assistance - 24 hour     Equipment Recommendations  Other (comment) (TBD)    Recommendations for Other Services Rehab consult     Precautions / Restrictions Precautions Precautions: Fall Precaution Comments: impulsive at times    Mobility  Bed Mobility               General bed mobility comments: pt recieved in chair  Transfers Overall transfer level: Needs assistance Equipment used: Rolling walker (2 wheeled) Transfers: Sit to/from [facility] Sit to Stand: Mod assist Stand pivot transfers: Mod assist       General transfer comment: impulsive during transfers. Decreased awareness of position of R foot. significant ataxia  Ambulation/Gait Ambulation/Gait assistance: Mod assist;Max assist Ambulation Distance (Feet): 80 Feet (seated rest break after 40 ft) Assistive device: Rolling walker (2 wheeled) Gait Pattern/deviations: Ataxic     General Gait Details: Max VCs for slow sequencing and use of RW. Patient very impulsive and ataxic, difficulty with consistent gait. Assist for stability   Stairs            Wheelchair Mobility    Modified Rankin (Stroke Patients Only) Modified Rankin (Stroke Patients Only) Pre-Morbid Rankin Score: No symptoms Modified Rankin: Severe disability     Balance Overall balance assessment:  Needs assistance Sitting-balance support: Feet supported;Bilateral upper extremity supported Sitting balance-Leahy Scale: Poor Sitting balance - Comments: trunkal ataxia Postural control: Other (comment) (ataxic) Standing balance support: During functional activity;Bilateral upper extremity supported Standing balance-Leahy Scale: Poor                      Cognition Arousal/Alertness: Awake/alert Behavior During Therapy: Flat affect;Impulsive (impulsive at times with mobility) Overall Cognitive Status: Impaired/Different from baseline Area of Impairment: Problem solving;Attention;Safety/judgement;Awareness   Current Attention Level: Selective     Safety/Judgement: Decreased awareness of safety;Decreased awareness of deficits Awareness: Emergent Problem Solving: Slow processing;Difficulty sequencing;Requires tactile cues General Comments: unsure of baseline cognition. Pt has history of drug abuse    Exercises      General Comments General comments (skin integrity, edema, etc.): patient aslo assisted with use of BSC, max cues for safety and impulsivity      Pertinent Vitals/Pain VSS, pt reports no pain    Home Living Family/patient expects to be discharged to:: Private residence Living Arrangements: Other relatives Available Help at Discharge: Family Type of Home: Apartment ([place]) Home Access: Stairs to enter   Home Layout: One level Home Equipment: None      Prior Function Level of Independence: Independent          PT Goals (current goals can now be found in the care plan section) Acute Rehab PT Goals Patient Stated Goal: to walk PT Goal Formulation: With patient Time For Goal Achievement: 09/04/13 Potential to Achieve Goals: Good Progress towards PT goals: Progressing toward goals    Frequency  Min 4X/week    PT Plan Current plan remains appropriate  Co-evaluation             End of Session Equipment Utilized During Treatment: Gait  belt Activity Tolerance: Patient limited by fatigue Patient left: in chair;with call bell/phone within reach     Time: 1349-1412 PT Time Calculation (min): 23 min  Charges:  $Gait Training: 8-22 mins $Therapeutic Activity: 8-22 mins                    G Codes:      Duncan Dull 09/08/13, 3:17 PM Alben Deeds, [place] DPT  [phone]

## 2013-08-23 NOTE — Consult Note (Signed)
Physical Medicine and Rehabilitation Consult  Reason for Consult: Dizziness, impaired balance, left sided weakness, severe dysarthria.  Referring Physician:  Dr. Rita Ohara    HPI: [name] is a 58 y.o. female with history of Hep C, HTN, tobacco abuse who was admitted on 08/18/13 with worst HA of her life and fall with question LOC. She was transported by EMS and has emesis with SBP 230 enroute. She was intubated in ED and CT head with large cerebellar bleed with significant mass effect and probable tonsillar herniation and near complete obliteration of 4 th ventricle.  UDS positive for ETOH and cocaine. She was started on mannitol as well as cardene drip and taken to OR emergently for suboccipital craniectomy and evacuation of cerebellar hematoma by Dr Kathyrn Sheriff. Extubated without difficulty on 05/21 but failed BSS. Patient with resultant dizzines, mild weakness left side, impaired balance, dysarthria and dysphagia. Neurology felt that patient had hemorrhage due to malignant hypertension as result of cocaine use and stroke workup underway. MBS done yesterday and patient started on Dysphaga 2, nectar liquids due to aspiration of thin. Therapy evaluations initiated and CIR recommended by MD and Rehab team.    Review of Systems  Eyes: Positive for blurred vision and double vision.  Respiratory: Negative for cough and shortness of breath.   Cardiovascular: Negative for chest pain and palpitations.  Gastrointestinal: Negative for heartburn and vomiting.  Musculoskeletal: Negative for myalgias.  Neurological: Positive for dizziness and headaches.    Past Medical History  Diagnosis Date  . Hepatitis C   . Pneumonia   . Hypertension    Past Surgical History  Procedure Laterality Date  . Neck surgery    . Shoulder surgery Bilateral   . Craniectomy N/A 08/18/2013    Procedure: CRANIECTOMY POSTERIOR FOSSA DECOMPRESSION, EVACUATION OF HEMORRHAGE;  Surgeon: Consuella Lose, MD;   Location: [facility];  Service: Neurosurgery;  Laterality: N/A;   Family History  Problem Relation Age of Onset  . Diabetes Father   . Diabetes Sister    Social History:  Lives with 35 year old daughter and elderly aunt. Unemployed. She reports that she has been smoking Cigarettes--2 PPD.  She has a 80 pack-year smoking history. She has never used smokeless tobacco. She reports that she drinks about 27.6 ounces of alcohol per week. She reports that she uses illicit drugs (Cocaine).   Allergies  Allergen Reactions  . Ibuprofen Hives and Other (See Comments)    Burning of the skin.   Medications Prior to Admission  Medication Sig Dispense Refill  . albuterol (PROVENTIL HFA;VENTOLIN HFA) 108 (90 BASE) MCG/ACT inhaler Inhale 2 puffs into the lungs every 6 (six) hours as needed for wheezing.  3 Inhaler  3  . FLUoxetine (PROZAC) 20 MG capsule Take 1 capsule (20 mg total) by mouth daily.  30 capsule  3  . hydrOXYzine (ATARAX/VISTARIL) 10 MG tablet Take 1-2 tablets (10-20 mg total) by mouth every 8 (eight) hours as needed for itching.  45 tablet  0  . metoprolol tartrate (LOPRESSOR) 25 MG tablet Take 0.5 tablets (12.5 mg total) by mouth 2 (two) times daily.  60 tablet  5  . nicotine (EQ NICOTINE) 14 mg/24hr patch Place 1 patch (14 mg total) onto the skin daily.  28 patch  0  . ofloxacin (OCUFLOX) 0.3 % ophthalmic solution Place 1 drop into both eyes 4 (four) times daily.  10 mL  2  . predniSONE (DELTASONE) 20 MG tablet Take 2  tablets (40 mg total) by mouth daily with breakfast.  10 tablet  0  . terbinafine (LAMISIL AT) 1 % cream Apply 1 application topically 2 (two) times daily.  30 g  3  . zolpidem (AMBIEN) 5 MG tablet Take 5 mg by mouth at bedtime as needed for sleep.        Home: Home Living Family/patient expects to be discharged to:: Private residence Living Arrangements: Other relatives Available Help at Discharge: Family Type of Home:  (Townhome) Home Access: Level entry Home  Layout: One level Home Equipment: None  Functional History: Prior Function Level of Independence: Independent Functional Status:  Mobility: Bed Mobility Overal bed mobility: +2 for physical assistance;Needs Assistance Bed Mobility: Supine to Sit Supine to sit: +2 for physical assistance General bed mobility comments: (A) to elevate trunk OOB with max cues for technique.  Transfers Overall transfer level: Needs assistance Equipment used: 2 person hand held assist Transfers: Stand Pivot Transfers Stand pivot transfers: +2 physical assistance;Max assist General transfer comment: (A) to initiate forward translation and trunk extension with manual cues to prevent bilateral LE knee buckling.  Pt with poor coordination and continuous dizziness limiting overall transfer ability.       ADL:    Cognition: Cognition Overall Cognitive Status: Impaired/Different from baseline Orientation Level: Oriented X4 Cognition Arousal/Alertness: Awake/alert Behavior During Therapy: Flat affect Overall Cognitive Status: Impaired/Different from baseline Area of Impairment: Problem solving;Attention Current Attention Level: Alternating Problem Solving: Slow processing;Difficulty sequencing  Blood pressure 146/89, pulse 84, temperature 98.1 F (36.7 C), temperature source Oral, resp. rate 20, height 5\' 7"  (1.702 m), weight 81.4 kg (179 lb 7.3 oz), SpO2 97.00%. Physical Exam  Constitutional: She is oriented to person, place, and time. She appears well-developed and well-nourished.  HENT:  Head: Normocephalic.  Eyes: Conjunctivae are normal. Pupils are equal, round, and reactive to light.  Nystagmus right lateral and upward fields.   Neck: Normal range of motion. Neck supple.  Respiratory: Effort normal and breath sounds normal. No respiratory distress. She has no wheezes.  GI: Soft. Bowel sounds are normal. She exhibits no distension. There is no tenderness.  Neurological: She is alert and oriented  to person, place, and time.  Keeps eyes closed but able to follow basic commands without difficulty. She needed  cues to slow down and enunciate due to moderate to severe dysarthria. Ataxia RUE and RLE. Impulsive with poor attention and decreased insight.   Skin: Skin is warm and dry.  Psychiatric:  Flat, doesn't want to engage    Results for orders placed during the hospital encounter of 08/18/13 (from the past 24 hour(s))  GLUCOSE, CAPILLARY     Status: Abnormal   Collection Time    08/22/13 12:17 PM      Result Value Ref Range   Glucose-Capillary 108 (*) 70 - 99 mg/dL  GLUCOSE, CAPILLARY     Status: Abnormal   Collection Time    08/22/13  4:06 PM      Result Value Ref Range   Glucose-Capillary 149 (*) 70 - 99 mg/dL  GLUCOSE, CAPILLARY     Status: Abnormal   Collection Time    08/22/13  7:35 PM      Result Value Ref Range   Glucose-Capillary 122 (*) 70 - 99 mg/dL  GLUCOSE, CAPILLARY     Status: Abnormal   Collection Time    08/22/13 11:42 PM      Result Value Ref Range   Glucose-Capillary 113 (*) 70 - 99 mg/dL  GLUCOSE, CAPILLARY     Status: Abnormal   Collection Time    08/23/13  1:10 AM      Result Value Ref Range   Glucose-Capillary 110 (*) 70 - 99 mg/dL  GLUCOSE, CAPILLARY     Status: Abnormal   Collection Time    08/23/13  3:38 AM      Result Value Ref Range   Glucose-Capillary 155 (*) 70 - 99 mg/dL  GLUCOSE, CAPILLARY     Status: Abnormal   Collection Time    08/23/13  8:43 AM      Result Value Ref Range   Glucose-Capillary 116 (*) 70 - 99 mg/dL   Dg Swallowing Func-speech Pathology  08/22/2013   Marcille Buffy, CCC-SLP     08/22/2013  5:47 PM Objective Swallowing Evaluation: Modified Barium Swallowing Study   Patient Details  Name: STAV DELAROCA MRN: [id number] Date of Birth: 09-May-1955  Today's Date: 08/22/2013 Time: 1100-1130 SLP Time Calculation (min): 30 min  Past Medical History:  Past Medical History  Diagnosis Date  . Hepatitis C   . Pneumonia   .  Hypertension    Past Surgical History:  Past Surgical History  Procedure Laterality Date  . Neck surgery    . Shoulder surgery Bilateral   . Craniectomy N/A 08/18/2013    Procedure: CRANIECTOMY POSTERIOR FOSSA DECOMPRESSION,  EVACUATION OF HEMORRHAGE;  Surgeon: Consuella Lose, MD;   Location: [facility];  Service: Neurosurgery;  Laterality: N/A;    HPI:  58 year old female admitted 08/18/13 due to AMS with headache,  fall and slurred speech. Pt intubated 5/20-21/15. PMH significant  for HepC, PNA, HTN. CT revealed large posterior fossa hematoma  with compression of the medulla/pons and effacement of the 4th  ventricle  Pt underwent posterior fossa decompression.   MBS  indicated to assess risk for aspiration and determine safest, PO  diet.       Assessment / Plan / Recommendation Clinical Impression  Dysphagia Diagnosis: Moderate oral phase dysphagia;Moderate  pharyngeal phase dysphagia  Moderate sensory- motor oral dysphagia characterized by delayed  oral transit, reduced posterior propulsion, (especially with  whole barium tablet) and lingual weakness.  Moderate  sensory-motor pharyngeal dysphagia marked by decreased TBR and  reduced airway closure.  Suspected mechanical based dysphagia.   Evidence of hardware at C4 to C-7.  Appeared to be diverticulum  at level of pyriforms but no radiologist present to confirm.   Pre-spill to pyriforms with thin liquid by cup/straw and nectar  by cup in larger amounts.  Moderate amount of aspiration occurred  during the swallow with thin liquid by cup/straw with no  sensation.  Instructed cough and throat clear ineffective to  clear aspirated material.  Once instance of aspiration during  swallow with nectar by cup due to patient taking too large of  amounts with swallows in succession in attempts to transit whole  barium tablet.  Cognitively unable to complete any compensatory  strategy.    Recommend to proceed with dysphagia 2 (finely chopped) and  nectar thick liquids by  cup sips only.  Total assist with all  meals as aspiration risk remains secondary to decreased safety  awareness.  Continued ST in acute care setting for diet  consistency management.  Repeat MBS in 3 to 5 days with clinical  improvement prior to liquid upgrade.   Treatment Recommendation  F/U MBS in __3-5 days with clinical improvement.  Diagnostic  treatment completed following evaluation focusing on providing  caregivers  diet recommendations and use of aspiration  precautions.     Diet Recommendation Dysphagia 2 (Fine chop);Nectar-thick liquid   Liquid Administration via: Cup;No straw Medication Administration: Crushed with puree Supervision: Full supervision/cueing for compensatory strategies Compensations: Slow rate;Small sips/bites;Multiple dry swallows  after each bite/sip Postural Changes and/or Swallow Maneuvers: Out of bed for  meals;Seated upright 90 degrees;Upright 30-60 min after meal    Other  Recommendations Oral Care Recommendations: Oral care Q4  per protocol Other Recommendations: Order thickener from pharmacy;Other  (Comment);Have oral suction available;Clarify dietary  restrictions;Prohibited food (jello, ice cream, thin  soups);Remove water pitcher   Follow Up Recommendations  Inpatient Rehab    Frequency and Duration min 2x/week  2 weeks       SLP Swallow Goals     General Date of Onset: 08/18/13 HPI: 58 year old female admitted 08/18/13 due to AMS with  headache, fall and slurred speech. Pt intubated 5/20-21/15. PMH  significant for HepC, PNA, HTN. CT revealed large posterior fossa  hematoma with compression of the medulla/pons and effacement of  the 4th ventricle  Pt underwent posterior fossa decompression.  BSE ordered to evaluate po readiness.  Type of Study: Modified Barium Swallowing Study Reason for Referral: Objectively evaluate swallowing function Previous Swallow Assessment: BSE 08/20/13 NPO  Diet Prior to this Study: NPO Temperature Spikes Noted: No Respiratory Status: Room air History  of Recent Intubation: Yes Length of Intubations (days): 2 days Date extubated: 08/19/13 Behavior/Cognition:  Alert;Cooperative;Confused;Distractible;Requires cueing;Decreased  sustained attention Oral Cavity - Dentition: Edentulous Oral Motor / Sensory Function: Impaired - see Bedside swallow  eval Self-Feeding Abilities: Able to feed self;Needs assist Patient Positioning: Upright in chair Baseline Vocal Quality: Clear Volitional Cough: Weak Volitional Swallow: Able to elicit Anatomy: Within functional limits Pharyngeal Secretions: Not observed secondary MBS    Reason for Referral Objectively evaluate swallowing function   Oral Phase Oral Preparation/Oral Phase Oral Phase: Impaired Oral - Nectar Oral - Nectar Teaspoon: Incomplete tongue to palate  contact;Reduced posterior propulsion;Weak lingual  manipulation;Piecemeal swallowing;Delayed oral transit Oral - Nectar Cup: Incomplete tongue to palate contact;Piecemeal  swallowing;Reduced posterior propulsion;Weak lingual  manipulation;Delayed oral transit Oral - Thin Oral - Thin Teaspoon: Weak lingual manipulation;Incomplete tongue  to palate contact;Piecemeal swallowing Oral - Thin Cup: Incomplete tongue to palate contact;Reduced  posterior propulsion;Delayed oral transit Oral - Solids Oral - Puree: Weak lingual manipulation;Piecemeal  swallowing;Incomplete tongue to palate contact Oral - Mechanical Soft: Weak lingual manipulation;Delayed oral  transit;Reduced posterior propulsion;Piecemeal  swallowing;Incomplete tongue to palate contact Oral - Pill: Weak lingual manipulation;Delayed oral  transit;Reduced posterior propulsion;Incomplete tongue to palate  contact   Pharyngeal Phase Pharyngeal Phase Pharyngeal Phase: Impaired Pharyngeal - Nectar Pharyngeal - Nectar Cup: Reduced pharyngeal peristalsis;Reduced  tongue base retraction;Trace aspiration;Reduced airway/laryngeal  closure;Premature spillage to valleculae;Reduced laryngeal  elevation;Penetration/Aspiration  during swallow Penetration/Aspiration details (nectar cup): Material enters  airway, CONTACTS cords then ejected out Pharyngeal - Thin Pharyngeal - Thin Teaspoon: Reduced pharyngeal  peristalsis;Reduced tongue base retraction;Reduced laryngeal  elevation;Reduced airway/laryngeal closure Pharyngeal - Thin Cup: Reduced tongue base retraction;Reduced  pharyngeal peristalsis;Moderate aspiration;Penetration/Aspiration  during swallow;Reduced laryngeal elevation;Reduced  airway/laryngeal closure;Premature spillage to pyriform sinuses Penetration/Aspiration details (thin cup): Material enters  airway, passes BELOW cords without attempt by patient to eject  out (silent aspiration) Pharyngeal - Thin Straw: Moderate aspiration;Reduced tongue base  retraction;Reduced pharyngeal peristalsis;Reduced laryngeal  elevation;Reduced airway/laryngeal closure;Penetration/Aspiration  during swallow;Premature spillage to pyriform sinuses Pharyngeal - Solids Pharyngeal - Puree: Reduced pharyngeal peristalsis;Reduced tongue  base retraction;Reduced laryngeal elevation;Reduced  airway/laryngeal  closure Pharyngeal - Mechanical Soft: Reduced tongue base  retraction;Reduced pharyngeal peristalsis;Reduced laryngeal  elevation;Reduced airway/laryngeal closure  Cervical Esophageal Phase    GO    Cervical Esophageal Phase Cervical Esophageal Phase: [facility]        Sharman Crate MS, CCC-SLP [phone] Marcille Buffy 08/22/2013, 3:12 PM     Assessment/Plan: Diagnosis: right paracentral cerebellar ICH 1. Does the need for close, 24 hr/day medical supervision in concert with the patient's rehab needs make it unreasonable for this patient to be served in a less intensive setting? Yes 2. Co-Morbidities requiring supervision/potential complications: htn, polysubstance abuse 3. Due to bladder management, bowel management, safety, skin/wound care, disease management, medication administration, pain management and patient education, does the patient require  24 hr/day rehab nursing? Yes 4. Does the patient require coordinated care of a physician, rehab nurse, PT (1-2 hrs/day, 5 days/week), OT (1-2 hrs/day, 5 days/week) and SLP (1-2 hrs/day, 5 days/week) to address physical and functional deficits in the context of the above medical diagnosis(es)? Yes Addressing deficits in the following areas: balance, endurance, locomotion, strength, transferring, bowel/bladder control, bathing, dressing, feeding, grooming, toileting, cognition, speech, language, swallowing and psychosocial support 5. Can the patient actively participate in an intensive therapy program of at least 3 hrs of therapy per day at least 5 days per week? Yes 6. The potential for patient to make measurable gains while on inpatient rehab is excellent 7. Anticipated functional outcomes upon discharge from inpatient rehab are supervision and min assist  with PT, supervision and min assist with OT, supervision and min assist with SLP. 8. Estimated rehab length of stay to reach the above functional goals is: 16-22 days 9. Does the patient have adequate social supports to accommodate these discharge functional goals? Yes and Potentially 10. Anticipated D/C setting: Home 11. Anticipated post D/C treatments: HH therapy and Outpatient therapy 12. Overall Rehab/Functional Prognosis: good  RECOMMENDATIONS: This patient's condition is appropriate for continued rehabilitative care in the following setting: CIR Patient has agreed to participate in recommended program. Potentially Note that insurance prior authorization may be required for reimbursement for recommended care.  Comment: Rehab Admissions Coordinator to follow up.  Thanks,  Meredith Staggers, MD, Mellody Drown     08/23/2013

## 2013-08-23 NOTE — Progress Notes (Signed)
Speech Language Pathology Treatment:    Patient Details Name: MELVENA VINK MRN: [id number] DOB: 1955/05/02 Today's Date: 08/23/2013 Time: 4098-1191 SLP Time Calculation (min): 15 min  Assessment / Plan / Recommendation Clinical Impression  F/u after yesterday's MBS.  Pt tolerating dysphagia 2, nectar-thick liquids.  Required mod verbal cues to monitor rate and bolus size.  Pt impulsive, ataxic, with significant coughing observed when consuming large, consecutive boluses; eliminated when following precautions.  Pt unable to recall yesterday's study or results/recs.  Speech dysarthric but intelligible with repetition.  Pt would benefit from speech evaluation- please order.   HPI HPI: 58 year old female admitted 08/18/13 due to AMS with headache, fall and slurred speech. Pt intubated 5/20-21/15. PMH significant for HepC, PNA, HTN. CT revealed large posterior fossa hematoma with compression of the medulla/pons and effacement of the 4th ventricle  Pt underwent posterior fossa decompression. BSE ordered to evaluate po readiness.       SLP Plan  Continue with current plan of care    Recommendations Diet recommendations: Dysphagia 2 (fine chop);Nectar-thick liquid Medication Administration: Crushed with puree Supervision: Full supervision/cueing for compensatory strategies Compensations: Slow rate;Small sips/bites;Multiple dry swallows after each bite/sip Postural Changes and/or Swallow Maneuvers: Out of bed for meals;Seated upright 90 degrees;Upright 30-60 min after meal              Oral Care Recommendations: Oral care BID Follow up Recommendations: Inpatient Rehab Plan: Continue with current plan of care        Mattisyn Cardona L. Tivis Ringer, [place] CCC/SLP Pager [place] 08/23/2013, 10:56 AM

## 2013-08-24 LAB — GLUCOSE, CAPILLARY
GLUCOSE-CAPILLARY: 121 mg/dL — AB (ref 70–99)
GLUCOSE-CAPILLARY: 143 mg/dL — AB (ref 70–99)
Glucose-Capillary: 94 mg/dL (ref 70–99)
Glucose-Capillary: 144 mg/dL — ABNORMAL HIGH (ref 70–99)
Glucose-Capillary: 149 mg/dL — ABNORMAL HIGH (ref 70–99)
Glucose-Capillary: 162 mg/dL — ABNORMAL HIGH (ref 70–99)

## 2013-08-24 NOTE — Progress Notes (Signed)
Inpatient Rehabilitation  I met with the patient at the bedside to begin discussions of post acute rehab options. Pt. Was able to state that her [name] (of the home) had surgery on the same day this pt. did.  Pt. Gave me permission to phone Gregary Signs  and Gregary Signs requested I speak with the other Dimas Alexandria who is currently caring for Gregary Signs.  [name] is to speak with the family to discuss if 24 hour care is feasible/available.    She will call me back after she speaks with the family.  I have discussed with Lorne Skeens, CM.  Will follow up later today or in the am. Please call if questions.  [place] Admissions Coordinator Cell [phone] Office [phone]

## 2013-08-24 NOTE — Progress Notes (Signed)
Stroke Team Progress Note  HISTORY Crista Jenetta Downer Velador is an 58 y.o. female who took a shower and then suddenly had a sever HA (worst HA of her life) then fell down on 08/18/2013 at 1300. There is question of loss consciousness. EMS was called and patient was transported to ER as Code Stroke. While in route she had emisis and SBP was 230. CT head showed a large cerebellar bleed. Patient was brought back to ER, intubated and Neurosurgery was consulted for possible immediate evacuation. Patient was given 5 grams mannitol and Cardene was started to being BP down. Neurosurgery arrived and agreed to take patient for posterior decompression. Patient was not administered TPA secondary to hemorrhage. She was admitted post op to the neuro ICU for further evaluation and treatment.  SUBJECTIVE No complaints. Ready for rehab.  OBJECTIVE Most recent Vital Signs: Filed Vitals:   08/23/13 2200 08/24/13 0200 08/24/13 0646 08/24/13 0945  BP: 138/89 136/86 148/95 139/95  Pulse: 84 66 75 75  Temp: 98.8 F (37.1 C) 99.1 F (37.3 C) 98.3 F (36.8 C) 97.8 F (36.6 C)  TempSrc: Oral Oral Oral Oral  Resp: 23 22 20 20   Height:      Weight:      SpO2: 97% 93% 95% 95%   CBG (last 3)   Recent Labs  08/24/13 0431 08/24/13 0847 08/24/13 1147  GLUCAP 94 143* 144*    IV Fluid Intake:      MEDICATIONS  . insulin aspart  0-15 Units Subcutaneous 6 times per day  . metoprolol tartrate  25 mg Oral BID  . nicotine  14 mg Transdermal Daily  . ofloxacin  1 drop Both Eyes QID  . predniSONE  30 mg Oral Q breakfast  . senna-docusate  1 tablet Oral BID   PRN:  acetaminophen, acetaminophen, HYDROcodone-acetaminophen  Diet:  Dysphagia 2 nectar thick liquids Activity:  Out of bed with assistance DVT Prophylaxis:  SCDs   CLINICALLY SIGNIFICANT STUDIES Basic Metabolic Panel:   Recent Labs Lab 08/18/13 1345 08/19/13 0500  08/21/13 0500 08/22/13 0235  NA 143 140  < > 141 141  K 3.5* 4.0  < > 3.7 3.4*  CL 104  105  < > 104 103  CO2  --  24  < > 28 30  GLUCOSE 175* 158*  < > 75 89  BUN 8 8  < > 12 11  CREATININE 0.70 0.55  < > 0.56 0.54  CALCIUM  --  8.6  < > 9.0 9.1  MG  --  1.7  --   --   --   PHOS  --  3.4  --   --   --   < > = values in this interval not displayed. Liver Function Tests:   Recent Labs Lab 08/19/13 0500 08/22/13 0235  AST 117* 88*  ALT 110* 88*  ALKPHOS 85 73  BILITOT 1.5* 1.5*  PROT 6.4 6.2  ALBUMIN 2.9* 2.5*   CBC:   Recent Labs Lab 08/18/13 1341  08/21/13 0500 08/22/13 0235  WBC 8.2  < > 8.8 8.1  NEUTROABS 2.6  --   --   --   HGB 14.6  < > 12.7 12.8  HCT 42.3  < > 38.5 37.1  MCV 106.0*  < > 108.8* 106.0*  PLT 131*  < > 99* 119*  < > = values in this interval not displayed. Coagulation:   Recent Labs Lab 08/18/13 1341  LABPROT 13.5  INR 1.05  Cardiac Enzymes: No results found for this basename: CKTOTAL, CKMB, CKMBINDEX, TROPONINI,  in the last 168 hours Urinalysis:   Recent Labs Lab 08/18/13 1410  COLORURINE YELLOW  LABSPEC 1.012  PHURINE 7.0  GLUCOSEU 100*  HGBUR NEGATIVE  BILIRUBINUR NEGATIVE  KETONESUR NEGATIVE  PROTEINUR NEGATIVE  UROBILINOGEN 1.0  NITRITE NEGATIVE  LEUKOCYTESUR NEGATIVE   Lipid Panel    Component Value Date/Time   CHOL 114 03/30/2013 1624   TRIG 84 08/18/2013 1931   HDL 39* 03/30/2013 1624   CHOLHDL 2.9 03/30/2013 1624   VLDL 17 03/30/2013 1624   LDLCALC 58 03/30/2013 1624   HgbA1C  Lab Results  Component Value Date   HGBA1C 5.0 03/30/2013    Urine Drug Screen:     Component Value Date/Time   LABOPIA NONE DETECTED 08/18/2013 1410   COCAINSCRNUR POSITIVE* 08/18/2013 1410   LABBENZ NONE DETECTED 08/18/2013 1410   AMPHETMU NONE DETECTED 08/18/2013 1410   THCU NONE DETECTED 08/18/2013 1410   LABBARB NONE DETECTED 08/18/2013 1410    Alcohol Level:   Recent Labs Lab 08/18/13 1341  ETH 23*    Mr Abdomen W Wo Contrast  08/17/2013   1. Cysts within the right kidney have imaging and enhancement  characteristics compatible of benign Bosniak category 2 cysts. 2. Bosniak category 1 and 2 cysts within the left kidney. 3. Left adrenal adenoma.      CT of the brain   08/19/2013   Interval surgical decompression of posterior fossa hematoma as detailed above.    08/18/2013    Large acute intraparenchymal cerebellar hemorrhage with significant mass effect in the posterior fossa and probable tonsillar herniation. Fourth ventricle appears nearly completely effaced. Cannot exclude early hydrocephalus.   CXR   08/19/2013   Minimal bibasilar atelectasis, stable.    08/18/2013    Right internal jugular catheter to the level superior vena cava. No pneumothorax.  Minimal atelectasis versus scarring left lung base.  Otherwise stable chest radiograph.    08/18/2013    Endotracheal tube in appropriate position.   Electronically Signed   By: Suzy Bouchard M.D.   On: 08/18/2013 15:16   EKG  normal sinus rhythm. For complete results please see formal report.   Therapy Recommendations CIR  Physical Exam   Middle aged lady. Afebrile. Head is nontraumatic. Neck is supple without bruit.    Cardiac exam no murmur or gallop. Lungs are clear to auscultation. Distal pulses are well felt. Neurological Exam : awak alert oriented to name, Zacarias Pontes and May 2015. Severe dysarthria. Follows gaze in all directions. No nystagmus. Blinks to threat bilaterally. Fundi were not visualized. Vision acuity cannot be reliably tested. Follows commands consistently in all 4 extremities. Good antigravity strength on the right. Able to move left side but mild weakness compared to the right. Deep tendon reflexes symmetric. Plantars are downgoing.  ASSESSMENT Ms. LEANETTE EUTSLER is a 58 y.o. female presenting with with the worst headache of her life. Imaging confirms a large cerebellar hemorrhage with mass effect and tonsillar herniation, 4th vertical effaced. Status post decompressive craniotomy per Dr. Kathyrn Sheriff. Hemorrhage secondary  to malginant hypertension as a result of cocaine use with SBP > 230 on arrival. On no antithrombotics prior to admission. Stroke work up complete.   VDRF, extubated 5/21  Malignant hypertension  cardene drip d/c, home meds resumed though tube removed at time of extubation, now on IV metoprolol scheduled  BP 130-140s  Restarted home metoprolol with dose increased to 25mg  BID  Hepatitis  C  Cigarette smoker  etoh use, positive on admission  Cocaine use, positive on admission  On prednisone at home  Hospital day # 6  TREATMENT/PLAN  Agree with plans for rehab  No further stroke workup indicated.  Patient has a 10-15% risk of having another stroke over the next year, the highest risk is within 2 weeks of the most recent stroke/TIA (risk of having a stroke following a stroke or TIA is the same).  Ongoing risk factor control by Primary Care Physician  Stroke Service will sign off. Please call should any needs arise.  Follow up with Dr. Erlinda Hong, [facility], in 2 months.    Burnetta Sabin, MSN, RN, ANVP-BC, ANP-BC, [facility] Pager: [phone] 08/24/2013 12:54 PM  I have personally obtained a history, examined the patient, evaluated imaging results, and formulated the assessment and plan of care. I agree with the above. Antony Contras, MD  To contact Stroke Continuity provider, please refer to [email or web address] After hours, contact General Neurology

## 2013-08-24 NOTE — Progress Notes (Signed)
Physical Therapy Treatment Patient Details Name: [name] MRN: [id number] DOB: [birth year] Today's Date: 08/24/2013    History of Present Illness 58 year old female admitted 08/18/13 due to AMS with headache, fall and slurred speech. Pt intubated 5/20-21/15. PMH significant for HepC, PNA, HTN. CT revealed large posterior fossa hematoma with compression of the medulla/pons and effacement of the 4th ventricle  Pt underwent posterior fossa decompression    PT Comments    Pt con't to present with impulsivity and ataxia requiring assist x2 for all transfers and ambulation for safety and stability. Pt con't to benefit from CIR upon d/c.   Follow Up Recommendations  CIR;Supervision/Assistance - 24 hour     Equipment Recommendations       Recommendations for Other Services Rehab consult     Precautions / Restrictions Precautions Precautions: Fall Precaution Comments: impulsive Restrictions Weight Bearing Restrictions: No    Mobility  Bed Mobility Overal bed mobility: Needs Assistance Bed Mobility: Supine to Sit     Supine to sit: Min assist     General bed mobility comments: minA for safety due to pt's impulsivity  Transfers Overall transfer level: Needs assistance Equipment used: Rolling walker (2 wheeled) Transfers: Sit to/from Stand Sit to Stand: Mod assist         General transfer comment: impulsive, max directional v/c's for hand placement  Ambulation/Gait Ambulation/Gait assistance: Mod assist;+2 physical assistance;+2 safety/equipment Ambulation Distance (Feet): 60 Feet (x2) Assistive device: Rolling walker (2 wheeled) Gait Pattern/deviations: Step-through pattern;Ataxic;Narrow base of support;Trunk flexed;Decreased stance time - right;Decreased stride length     General Gait Details: max v/c's for sequencing, max tactile cues at posterior hips to provide appropriate cueing to achieve sequencing and provide support/stability   Stairs             Wheelchair Mobility    Modified Rankin (Stroke Patients Only) Modified Rankin (Stroke Patients Only) Pre-Morbid Rankin Score: No symptoms Modified Rankin: Severe disability     Balance Overall balance assessment: Needs assistance Sitting-balance support: No upper extremity supported;Feet supported Sitting balance-Leahy Scale: Fair     Standing balance support: Bilateral upper extremity supported Standing balance-Leahy Scale: Poor Standing balance comment: pt requires RW or bilat UE assist. worked on marching in place with 1 person in front and person behind. Pt with impaired body perception requiring maxA to maintain single limb stance. pt with weak glut medius. pt with over exagerant weight shift                    Cognition Arousal/Alertness: Awake/alert Behavior During Therapy: Impulsive Overall Cognitive Status: Impaired/Different from baseline Area of Impairment: Problem solving;Attention;Safety/judgement;Awareness   Current Attention Level: Selective     Safety/Judgement: Decreased awareness of safety;Decreased awareness of deficits Awareness: Emergent Problem Solving: Slow processing;Difficulty sequencing;Requires tactile cues      Exercises      General Comments        Pertinent Vitals/Pain Denies pain    Home Living                      Prior Function            PT Goals (current goals can now be found in the care plan section) Acute Rehab PT Goals Patient Stated Goal: didn't report Progress towards PT goals: Progressing toward goals    Frequency  Min 4X/week    PT Plan Current plan remains appropriate    Co-evaluation  End of Session Equipment Utilized During Treatment: Gait belt Activity Tolerance: Patient limited by fatigue Patient left: in chair;with call bell/phone within reach;with chair alarm set     Time: 0812-0837 PT Time Calculation (min): 25 min  Charges:  $Gait Training: 8-22  mins $Therapeutic Exercise: 8-22 mins                    G Codes:      Berline Lopes Sep 17, 2013, 10:43 AM  Kittie Plater, PT, DPT Pager #: [phone] Office #: [phone]

## 2013-08-25 LAB — GLUCOSE, CAPILLARY
GLUCOSE-CAPILLARY: 98 mg/dL (ref 70–99)
GLUCOSE-CAPILLARY: 114 mg/dL — AB (ref 70–99)
Glucose-Capillary: 117 mg/dL — ABNORMAL HIGH (ref 70–99)
Glucose-Capillary: 146 mg/dL — ABNORMAL HIGH (ref 70–99)
Glucose-Capillary: 162 mg/dL — ABNORMAL HIGH (ref 70–99)
Glucose-Capillary: 191 mg/dL — ABNORMAL HIGH (ref 70–99)
Glucose-Capillary: 94 mg/dL (ref 70–99)

## 2013-08-25 MED ORDER — HYDROCODONE-ACETAMINOPHEN 5-325 MG PO TABS: 1.0000 | ORAL_TABLET | ORAL | Status: DC | PRN

## 2013-08-25 MED ORDER — INSULIN ASPART 100 UNIT/ML ~~LOC~~ SOLN: 0.0000 [IU] | SUBCUTANEOUS | Status: DC

## 2013-08-25 NOTE — Progress Notes (Signed)
So far this morning patient refuses to wake up and fully participate in my assessment of her. She keeps stating "leave me alone, i just wanna sleep!" She is moving all extremities under the covers, respirations are even and regular, speech is slurred, denies pain.  Will continue to monitor

## 2013-08-25 NOTE — Progress Notes (Signed)
Occupational Therapy Treatment Patient Details Name: [name] MRN: [id number] DOB: [birth year] Today's Date: 08/25/2013    History of present illness 58 year old female admitted 08/18/13 due to AMS with headache, fall and slurred speech. Pt intubated 5/20-21/15. PMH significant for HepC, PNA, HTN. CT revealed large posterior fossa hematoma with compression of the medulla/pons and effacement of the 4th ventricle  Pt underwent posterior fossa decompression   OT comments  Pt seen today for ADL session to work on ataxic movements. Pt reports dizziness and some nausea when sitting upright. Educated pt on visual target to assist with nystagmus and decrease dizziness. Pt sat EOB and participated in ADL preparation activities with use of theraband to provide closed chain movements to assist with ataxia.    Follow Up Recommendations  CIR;Supervision/Assistance - 24 hour    Equipment Recommendations  3 in 1 bedside comode;Tub/shower bench       Precautions / Restrictions Precautions Precautions: Fall Precaution Comments: impulsive Restrictions Weight Bearing Restrictions: No       Mobility Bed Mobility Overal bed mobility: Needs Assistance Bed Mobility: Supine to Sit;Sit to Supine     Supine to sit: Min guard (for safety) Sit to supine: Min assist (hand held assist to lay back in bed)   General bed mobility comments: verbal cues to move slowly and (A) to lay back in supine without flinging trunk backwards.  Transfers Overall transfer level: Needs assistance Equipment used: None Transfers: Sit to/from Stand Sit to Stand: Mod assist         General transfer comment: Verbal cues for pt to move slowly. Placed pt's hands on knees and assisted pt to power up halfway from seated position to activate upper thigh muscles. Pt engaged x3 sit<>stand without achieving full stand. Pt's bil knees were blocked to prevent buckling.        ADL Overall ADL's : Needs  assistance/impaired                                       General ADL Comments: Pt presents with ataxia, worse in RUE. Pt participated in activities to promote participation in ADLs including reaching for objects across midline with resistance of theraband (orange) while sitting EOB. Pt also performed sit<>stand without achieving full stand to activate muscles in upper LEs.       Vision                 Additional Comments: Pt observed to have nystagmus; educated pt on use of visual target to stabilize.           Cognition  Arousal/Alertness: Lethargic Behavior During Therapy: Impulsive Overall Cognitive Status: Impaired/Different from baseline Area of Impairment: Attention;Safety/judgement;Awareness;Problem solving   Current Attention Level: Selective      Safety/Judgement: Decreased awareness of safety;Decreased awareness of deficits Awareness: Emergent Problem Solving: Slow processing;Difficulty sequencing                   Pertinent Vitals/ Pain       No c/o pain. Pt reports dizziness upon sitting upright.         Frequency Min 3X/week     Progress Toward Goals  OT Goals(current goals can now be found in the care plan section)  Progress towards OT goals: Progressing toward goals     Plan Discharge plan remains appropriate       End of Session Equipment Utilized  During Treatment: Other (comment) (theraband (orange))   Activity Tolerance Patient tolerated treatment well   Patient Left in bed;with call bell/phone within reach;with bed alarm set   Nurse Communication Other (comment) (pt requesting to eat; requires supervision)        Time: 1455-1519 OT Time Calculation (min): 24 min  Charges: OT General Charges $OT Visit: 1 Procedure OT Treatments $Self Care/Home Management : 8-22 mins $Therapeutic Activity: 8-22 mins  Juluis Rainier [phone] 08/25/2013, 3:58 PM

## 2013-08-25 NOTE — Progress Notes (Signed)
Speech Language Pathology Treatment: Dysphagia  Patient Details Name: [name] MRN: [id number] DOB: [birth year] Today's Date: 08/25/2013 Time: 8657-8469 SLP Time Calculation (min): 8 min  Assessment / Plan / Recommendation Clinical Impression  Pt consumed minimal PO trials this am, consisting of Dys 3 textures and nectar thick liquids. No overt s/s of aspiration were observed during intake with Mod cues for use of strategies, however delayed cough was noted after SLP had left the room. Pt appeared to have adequate mastication and oral clearance with Dys 3 textures, however would only consume two bites. Recommend to continue current diet at this time.  MD, recommend speech evaluation to more adequately assess speech and cognition.   HPI HPI: 58 year old female admitted 08/18/13 due to AMS with headache, fall and slurred speech. Pt intubated 5/20-21/15. PMH significant for HepC, PNA, HTN. CT revealed large posterior fossa hematoma with compression of the medulla/pons and effacement of the 4th ventricle  Pt underwent posterior fossa decompression. BSE ordered to evaluate po readiness.    Pertinent Vitals N/A  SLP Plan  Continue with current plan of care    Recommendations Diet recommendations: Dysphagia 2 (fine chop);Nectar-thick liquid Liquids provided via: Cup;No straw Medication Administration: Crushed with puree Supervision: Full supervision/cueing for compensatory strategies;Staff to assist with self feeding Compensations: Slow rate;Small sips/bites;Multiple dry swallows after each bite/sip Postural Changes and/or Swallow Maneuvers: Out of bed for meals;Seated upright 90 degrees;Upright 30-60 min after meal       Oral Care Recommendations: Oral care BID Follow up Recommendations: Inpatient Rehab Plan: Continue with current plan of care    GO      Germain Osgood, M.A. CCC-SLP [phone]  Germain Osgood 08/25/2013, 10:00 AM

## 2013-08-25 NOTE — Discharge Summary (Addendum)
Physician Discharge Summary  Patient ID: [name] MRN: [id number] DOB/AGE: 08-22-1955 58 y.o.  Admit date: 08/18/2013 Discharge date: 08/26/13  Admission Diagnoses:  1. Cerebellar hemorrhage  Discharge Diagnoses: Same Principal Problem:   Cerebellar hemorrhage, nontraumatic Active Problems:   Acute respiratory failure with hypoxia   Cytotoxic cerebral edema   Brain herniation   Discharged Condition: Stable  Hospital Course:  [name] is a 58 y.o. female initially admitted to the hospital after being brought in by EMS after a fall. Workup included CT scan of the head which demonstrated a large cerebellar hematoma. She had progressive neurologic decline in the emergency department necessitating emergent posterior fossa decompression and evacuation of hematoma. Postoperatively, the patient did very well, regained consciousness, and was extubated. She was following commands, and moving all extremities with good strength. She did have continued difficulty with speech and swallowing for which she was evaluated by speech and leg which pathology. She remained neurologically and hemodynamically stable and was transferred to the general neuroscience floor. She was seen by physical and occupational therapy as well as the rehabilitation coordinator and was deemed an appropriate candidate for transfer to an inpatient rehabilitation facility.  Treatments: Surgery - suboccipital craniectomy, evacuation of hematoma.  Discharge Exam: Blood pressure 134/82, pulse 77, temperature 98 F (36.7 C), temperature source Oral, resp. rate 20, height 5\' 6"  (1.676 m), weight 84.097 kg (185 lb 6.4 oz), SpO2 97.00%. Awake, alert, oriented Speech dysarthric but appropriate CN grossly intact 5/5 BUE/BLE Wound c/d/i  Follow-up: Follow-up in my office [facility] [phone]) in 4-6 weeks  Disposition: CIR     Medication List         albuterol 108 (90 BASE)  MCG/ACT inhaler  Commonly known as:  PROVENTIL HFA;VENTOLIN HFA  Inhale 2 puffs into the lungs every 6 (six) hours as needed for wheezing.     FLUoxetine 20 MG capsule  Commonly known as:  PROZAC  Take 1 capsule (20 mg total) by mouth daily.     HYDROcodone-acetaminophen 5-325 MG per tablet  Commonly known as:  NORCO/VICODIN  Take 1-2 tablets by mouth every 4 (four) hours as needed for moderate pain or severe pain.     hydrOXYzine 10 MG tablet  Commonly known as:  ATARAX/VISTARIL  Take 1-2 tablets (10-20 mg total) by mouth every 8 (eight) hours as needed for itching.     insulin aspart 100 UNIT/ML injection  Commonly known as:  novoLOG  Inject 0-15 Units into the skin every 4 (four) hours.     metoprolol tartrate 25 MG tablet  Commonly known as:  LOPRESSOR  Take 0.5 tablets (12.5 mg total) by mouth 2 (two) times daily.     nicotine 14 mg/24hr patch  Commonly known as:  EQ NICOTINE  Place 1 patch (14 mg total) onto the skin daily.     ofloxacin 0.3 % ophthalmic solution  Commonly known as:  OCUFLOX  Place 1 drop into both eyes 4 (four) times daily.     predniSONE 20 MG tablet  Commonly known as:  DELTASONE  Take 2 tablets (40 mg total) by mouth daily with breakfast.     terbinafine 1 % cream  Commonly known as:  LAMISIL AT  Apply 1 application topically 2 (two) times daily.     zolpidem 5 MG tablet  Commonly known as:  AMBIEN  Take 5 mg by mouth at bedtime as needed for sleep.  Follow-up Information   Follow up with Xu,Jindong, MD. Schedule an appointment as soon as possible for a visit in 2 months. (Stroke Clinic)    Specialty:  Neurology   Contact information:   [address] [place] [address] [phone]       Signed: Consuella Lose 08/25/2013, 11:01 AM

## 2013-08-25 NOTE — Progress Notes (Signed)
Inpatient Rehabilitation  I saw pt. At the bedside this am.  She is sleeping but arousable and able to answer questions.  I have attempted to contact pt's aunts Gregary Signs and [name] , and have left a voice message for them to call me back to discuss post acute rehab plans.  I await their call.   I will provide update as soon as I can speak with family.   Please call if questions.  [facility] Admissions Coordinator Cell [phone] Office [phone]

## 2013-08-25 NOTE — Progress Notes (Signed)
Inpatient Rehabilitation  I was able to reach pt's aunt Gregary Signs who gave me aunt Yvonne's cell  phone number [phone]) and Gregary Signs asked that I speak with her.  [name] states that she is in agreement with CIR admission "to do what's best for Gelisa" and fully understands that pt. will likely need SNF level care following CIR stay.  I carefully explained to [name] that SNF could be quite a distance away from [place] (several hours) and she voices understanding and agreement.  She says "there is nothing else we can do".  We will make arrangements to admit to rehab tomorrow. Please call if questions.  My co-worker Danne Baxter will be taking this case in my absence tomorrow and Friday.  Her number is [phone].  [place] [facility] Admissions Coordinator Cell [phone] Office [phone]

## 2013-08-26 ENCOUNTER — Inpatient Hospital Stay (HOSPITAL_COMMUNITY)
Admission: RE | Admit: 2013-08-26 | Discharge: 2013-09-10 | DRG: 945 | Disposition: A | Discharge status: Skilled Nursing Facility | Payer: Medicaid Other | Source: Intra-hospital | Attending: Physical Medicine & Rehabilitation | Admitting: Physical Medicine & Rehabilitation

## 2013-08-26 DIAGNOSIS — K5900 Constipation, unspecified: Secondary | ICD-10-CM | POA: Diagnosis present

## 2013-08-26 DIAGNOSIS — N390 Urinary tract infection, site not specified: Secondary | ICD-10-CM | POA: Diagnosis not present

## 2013-08-26 DIAGNOSIS — I10 Essential (primary) hypertension: Secondary | ICD-10-CM | POA: Diagnosis present

## 2013-08-26 DIAGNOSIS — Z91199 Patient's noncompliance with other medical treatment and regimen due to unspecified reason: Secondary | ICD-10-CM

## 2013-08-26 DIAGNOSIS — R471 Dysarthria and anarthria: Secondary | ICD-10-CM | POA: Diagnosis present

## 2013-08-26 DIAGNOSIS — K2970 Gastritis, unspecified, without bleeding: Secondary | ICD-10-CM | POA: Diagnosis present

## 2013-08-26 DIAGNOSIS — D696 Thrombocytopenia, unspecified: Secondary | ICD-10-CM | POA: Diagnosis present

## 2013-08-26 DIAGNOSIS — Z79899 Other long term (current) drug therapy: Secondary | ICD-10-CM

## 2013-08-26 DIAGNOSIS — Z886 Allergy status to analgesic agent status: Secondary | ICD-10-CM

## 2013-08-26 DIAGNOSIS — Z9119 Patient's noncompliance with other medical treatment and regimen: Secondary | ICD-10-CM | POA: Diagnosis not present

## 2013-08-26 DIAGNOSIS — Z5189 Encounter for other specified aftercare: Principal | ICD-10-CM

## 2013-08-26 DIAGNOSIS — H5500 Unspecified nystagmus: Secondary | ICD-10-CM | POA: Diagnosis present

## 2013-08-26 DIAGNOSIS — B182 Chronic viral hepatitis C: Secondary | ICD-10-CM | POA: Diagnosis present

## 2013-08-26 DIAGNOSIS — F4323 Adjustment disorder with mixed anxiety and depressed mood: Secondary | ICD-10-CM | POA: Diagnosis present

## 2013-08-26 DIAGNOSIS — R21 Rash and other nonspecific skin eruption: Secondary | ICD-10-CM | POA: Diagnosis present

## 2013-08-26 DIAGNOSIS — R279 Unspecified lack of coordination: Secondary | ICD-10-CM | POA: Diagnosis present

## 2013-08-26 DIAGNOSIS — Z833 Family history of diabetes mellitus: Secondary | ICD-10-CM

## 2013-08-26 DIAGNOSIS — R1310 Dysphagia, unspecified: Secondary | ICD-10-CM | POA: Diagnosis present

## 2013-08-26 DIAGNOSIS — K2990 Gastroduodenitis, unspecified, without bleeding: Secondary | ICD-10-CM

## 2013-08-26 DIAGNOSIS — R7989 Other specified abnormal findings of blood chemistry: Secondary | ICD-10-CM | POA: Diagnosis present

## 2013-08-26 DIAGNOSIS — F17200 Nicotine dependence, unspecified, uncomplicated: Secondary | ICD-10-CM | POA: Diagnosis present

## 2013-08-26 DIAGNOSIS — I69993 Ataxia following unspecified cerebrovascular disease: Secondary | ICD-10-CM

## 2013-08-26 DIAGNOSIS — R29898 Other symptoms and signs involving the musculoskeletal system: Secondary | ICD-10-CM | POA: Diagnosis present

## 2013-08-26 DIAGNOSIS — I614 Nontraumatic intracerebral hemorrhage in cerebellum: Secondary | ICD-10-CM

## 2013-08-26 DIAGNOSIS — E876 Hypokalemia: Secondary | ICD-10-CM | POA: Diagnosis not present

## 2013-08-26 DIAGNOSIS — R4587 Impulsiveness: Secondary | ICD-10-CM | POA: Diagnosis present

## 2013-08-26 DIAGNOSIS — M25569 Pain in unspecified knee: Secondary | ICD-10-CM | POA: Diagnosis present

## 2013-08-26 DIAGNOSIS — E119 Type 2 diabetes mellitus without complications: Secondary | ICD-10-CM | POA: Diagnosis present

## 2013-08-26 DIAGNOSIS — B952 Enterococcus as the cause of diseases classified elsewhere: Secondary | ICD-10-CM | POA: Diagnosis not present

## 2013-08-26 DIAGNOSIS — G8929 Other chronic pain: Secondary | ICD-10-CM | POA: Diagnosis present

## 2013-08-26 DIAGNOSIS — F1410 Cocaine abuse, uncomplicated: Secondary | ICD-10-CM | POA: Diagnosis present

## 2013-08-26 DIAGNOSIS — I619 Nontraumatic intracerebral hemorrhage, unspecified: Secondary | ICD-10-CM | POA: Diagnosis present

## 2013-08-26 DIAGNOSIS — B1920 Unspecified viral hepatitis C without hepatic coma: Secondary | ICD-10-CM | POA: Diagnosis present

## 2013-08-26 HISTORY — DX: Type 2 diabetes mellitus without complications: E11.9

## 2013-08-26 LAB — GLUCOSE, CAPILLARY
GLUCOSE-CAPILLARY: 117 mg/dL — AB (ref 70–99)
GLUCOSE-CAPILLARY: 100 mg/dL — AB (ref 70–99)
GLUCOSE-CAPILLARY: 176 mg/dL — AB (ref 70–99)
GLUCOSE-CAPILLARY: 155 mg/dL — AB (ref 70–99)
Glucose-Capillary: 115 mg/dL — ABNORMAL HIGH (ref 70–99)
Glucose-Capillary: 128 mg/dL — ABNORMAL HIGH (ref 70–99)

## 2013-08-26 MED ORDER — NICOTINE 14 MG/24HR TD PT24
14.0000 mg | MEDICATED_PATCH | Freq: Every day | TRANSDERMAL | Status: DC
  Administered 2013-08-27 – 2013-09-10 (×15): 14 mg via TRANSDERMAL
  Filled 2013-08-26 (×16): qty 1

## 2013-08-26 MED ORDER — FLEET ENEMA 7-19 GM/118ML RE ENEM: 1.0000 | ENEMA | Freq: Once | RECTAL | Status: AC | PRN

## 2013-08-26 MED ORDER — GUAIFENESIN-DM 100-10 MG/5ML PO SYRP: 5.0000 mL | ORAL_SOLUTION | Freq: Four times a day (QID) | ORAL | Status: DC | PRN

## 2013-08-26 MED ORDER — ACETAMINOPHEN 325 MG PO TABS: 325.0000 mg | ORAL_TABLET | ORAL | Status: DC | PRN

## 2013-08-26 MED ORDER — BISACODYL 10 MG RE SUPP
10.0000 mg | Freq: Every day | RECTAL | Status: DC | PRN
  Administered 2013-08-27 – 2013-09-08 (×3): 10 mg via RECTAL
  Filled 2013-08-26 (×3): qty 1

## 2013-08-26 MED ORDER — TRAZODONE HCL 50 MG PO TABS
25.0000 mg | ORAL_TABLET | Freq: Every evening | ORAL | Status: DC | PRN
  Administered 2013-09-05: 50 mg via ORAL
  Filled 2013-08-26: qty 1

## 2013-08-26 MED ORDER — PREDNISONE 20 MG PO TABS
20.0000 mg | ORAL_TABLET | Freq: Every day | ORAL | Status: AC
  Administered 2013-08-27 – 2013-08-29 (×3): 20 mg via ORAL
  Filled 2013-08-26 (×3): qty 1

## 2013-08-26 MED ORDER — PREDNISONE 10 MG PO TABS
10.0000 mg | ORAL_TABLET | Freq: Every day | ORAL | Status: AC
  Administered 2013-08-30 – 2013-09-01 (×3): 10 mg via ORAL
  Filled 2013-08-26 (×3): qty 1

## 2013-08-26 MED ORDER — INSULIN ASPART 100 UNIT/ML ~~LOC~~ SOLN
0.0000 [IU] | SUBCUTANEOUS | Status: DC
  Administered 2013-08-26 – 2013-08-27 (×2): 2 [IU] via SUBCUTANEOUS
  Administered 2013-08-27: 3 [IU] via SUBCUTANEOUS
  Administered 2013-08-28: 2 [IU] via SUBCUTANEOUS
  Administered 2013-08-28: 5 [IU] via SUBCUTANEOUS
  Administered 2013-08-29 (×2): 2 [IU] via SUBCUTANEOUS
  Administered 2013-08-29: 5 [IU] via SUBCUTANEOUS
  Administered 2013-08-30: 3 [IU] via SUBCUTANEOUS
  Administered 2013-08-30: 2 [IU] via SUBCUTANEOUS
  Administered 2013-08-30: 3 [IU] via SUBCUTANEOUS
  Administered 2013-08-31: 2 [IU] via SUBCUTANEOUS

## 2013-08-26 MED ORDER — METOPROLOL TARTRATE 25 MG PO TABS
25.0000 mg | ORAL_TABLET | Freq: Two times a day (BID) | ORAL | Status: DC
  Administered 2013-08-26 – 2013-09-10 (×27): 25 mg via ORAL
  Filled 2013-08-26 (×32): qty 1

## 2013-08-26 MED ORDER — PROCHLORPERAZINE MALEATE 5 MG PO TABS
5.0000 mg | ORAL_TABLET | Freq: Four times a day (QID) | ORAL | Status: DC | PRN
  Administered 2013-09-02: 5 mg via ORAL
  Administered 2013-09-07 – 2013-09-09 (×3): 10 mg via ORAL
  Filled 2013-08-26 (×6): qty 2

## 2013-08-26 MED ORDER — ALUM & MAG HYDROXIDE-SIMETH 200-200-20 MG/5ML PO SUSP
30.0000 mL | ORAL | Status: DC | PRN
  Administered 2013-09-03: 30 mL via ORAL
  Filled 2013-08-26: qty 30

## 2013-08-26 MED ORDER — SENNOSIDES-DOCUSATE SODIUM 8.6-50 MG PO TABS
1.0000 | ORAL_TABLET | Freq: Two times a day (BID) | ORAL | Status: DC
  Administered 2013-08-26 – 2013-09-10 (×24): 1 via ORAL
  Filled 2013-08-26 (×25): qty 1

## 2013-08-26 MED ORDER — PROCHLORPERAZINE 25 MG RE SUPP
12.5000 mg | Freq: Four times a day (QID) | RECTAL | Status: DC | PRN
  Filled 2013-08-26: qty 1

## 2013-08-26 MED ORDER — FLUOXETINE HCL 10 MG PO CAPS
10.0000 mg | ORAL_CAPSULE | Freq: Every day | ORAL | Status: DC
  Administered 2013-08-26: 10 mg via ORAL
  Filled 2013-08-26: qty 1

## 2013-08-26 MED ORDER — PROCHLORPERAZINE EDISYLATE 5 MG/ML IJ SOLN
5.0000 mg | Freq: Four times a day (QID) | INTRAMUSCULAR | Status: DC | PRN
  Filled 2013-08-26 (×2): qty 2

## 2013-08-26 MED ORDER — DIPHENHYDRAMINE HCL 12.5 MG/5ML PO ELIX: 12.5000 mg | ORAL_SOLUTION | Freq: Four times a day (QID) | ORAL | Status: DC | PRN

## 2013-08-26 MED ORDER — FAMOTIDINE 10 MG PO TABS
10.0000 mg | ORAL_TABLET | Freq: Two times a day (BID) | ORAL | Status: DC
  Administered 2013-08-26 – 2013-09-10 (×30): 10 mg via ORAL
  Filled 2013-08-26 (×36): qty 1

## 2013-08-26 MED ORDER — HYDROCODONE-ACETAMINOPHEN 5-325 MG PO TABS
1.0000 | ORAL_TABLET | ORAL | Status: DC | PRN
  Administered 2013-08-26: 2 via ORAL
  Administered 2013-08-27 – 2013-08-29 (×4): 1 via ORAL
  Administered 2013-08-30 (×2): 2 via ORAL
  Administered 2013-08-31 (×2): 1 via ORAL
  Administered 2013-09-01: 2 via ORAL
  Administered 2013-09-02 – 2013-09-05 (×4): 1 via ORAL
  Administered 2013-09-06 – 2013-09-09 (×4): 2 via ORAL
  Filled 2013-08-26: qty 2
  Filled 2013-08-26: qty 1
  Filled 2013-08-26 (×2): qty 2
  Filled 2013-08-26 (×2): qty 1
  Filled 2013-08-26: qty 2
  Filled 2013-08-26: qty 1
  Filled 2013-08-26 (×3): qty 2
  Filled 2013-08-26 (×4): qty 1
  Filled 2013-08-26: qty 2
  Filled 2013-08-26: qty 1
  Filled 2013-08-26: qty 2

## 2013-08-26 NOTE — Progress Notes (Signed)
PMR Admission Coordinator Pre-Admission Assessment  Patient: [name] is an 58 y.o., female  MRN: [id number]  DOB: Feb 15, 1956  Height: _0  (167.6 cm)  Weight: 84.097 kg (185 lb 6.4 oz)  Insurance Information  HMO: PPO: PCP: IPA: 80/20: OTHER:  PRIMARY: Uninsured. Aunt Maryln Eastham states pt has appealed a previous diabilty application placed years ago. I have notified financial counselor Aleene Davidson that Dimas Alexandria can assist with disability and Medicaid pending applications. Pt does have a 55 yo daughter in the home. Aleene Davidson [phone]  Medicaid Application Date: tba Case Manager: tba  Disability Application Date: tba Case Worker: tba  Emergency Contact Information    Contact Information     Name  Relation  Home  Work  Mobile     [place]  Aunt    [place]  [phone]       [facility]  Other    [phone]        Current Medical History  Patient Admitting Diagnosis: right paracentral cerebellar ICH  History of Present Illness:HPI: [name] is a 58 y.o. female with history of Hep C, HTN, tobacco abuse who was admitted on 08/18/13 with worst HA of her life and fall with question LOC. She was transported by EMS and has emesis with SBP 230 enroute. She was intubated in ED and CT head with large cerebellar bleed with significant mass effect and probable tonsillar herniation and near complete obliteration of 4 th ventricle. UDS positive for ETOH and cocaine.  She was started on mannitol as well as cardene drip and taken to OR emergently for suboccipital craniectomy and evacuation of cerebellar hematoma by Dr Kathyrn Sheriff. Extubated without difficulty on 05/21 but failed BSS. Patient with resultant dizzines, mild weakness left side, impaired balance, dysarthria and dysphagia. Neurology felt that patient had hemorrhage due to malignant hypertension as result of cocaine use and stroke workup underway. MBS done yesterday and patient  started on Dysphaga 2, nectar liquids due to aspiration of thin.  Total: 2 NIH   Past Medical History    Past Medical History    Diagnosis  Date    .  Hepatitis C     .  Pneumonia     .  Hypertension      Family History  family history includes Diabetes in her father and sister.  Prior Rehab/Hospitalizations: none  Current Medications  Current facility-administered medications:acetaminophen (TYLENOL) suppository 650 mg, 650 mg, Rectal, Q4H PRN, Consuella Lose, MD; acetaminophen (TYLENOL) tablet 650 mg, 650 mg, Oral, Q4H PRN, Consuella Lose, MD; HYDROcodone-acetaminophen (NORCO/VICODIN) 5-325 MG per tablet 1-2 tablet, 1-2 tablet, Oral, Q4H PRN, Hosie Spangle, MD, 2 tablet at 08/25/13 2140  insulin aspart (novoLOG) injection 0-15 Units, 0-15 Units, Subcutaneous, 6 times per day, Doree Fudge, MD, 2 Units at 08/25/13 2140; metoprolol tartrate (LOPRESSOR) tablet 25 mg, 25 mg, Oral, BID, Marijean Heath, NP, 25 mg at 08/26/13 1057; nicotine (NICODERM CQ - dosed in mg/24 hours) patch 14 mg, 14 mg, Transdermal, Daily, Donzetta Starch, NP, 14 mg at 08/25/13 0947  ofloxacin (OCUFLOX) 0.3 % ophthalmic solution 1 drop, 1 drop, Both Eyes, QID, Donzetta Starch, NP, 1 drop at 08/26/13 1103; predniSONE (DELTASONE) tablet 30 mg, 30 mg, Oral, Q breakfast, Kara Mead V, MD, 30 mg at 08/26/13 1057; senna-docusate (Senokot-S) tablet 1 tablet, 1 tablet, Oral, BID, Consuella Lose, MD, 1 tablet at 08/26/13 1057  Patients Current Diet: Dysphagia 2 diet with nectar thick  liquids. Meds crushed and given in pureed  Precautions / Restrictions  Precautions  Precautions: Fall  Precaution Comments: impulsive  Restrictions  Weight Bearing Restrictions: No  Prior Activity Level  Community (5-7x/wk): nightowl type person; meticulous house cleaning, watches movies throughtout the day.Marland Kitchen Has not liked alot of light and sunlight for years.  Pt lives with her cousin Crytal Pensinger (Yvonne's daughter)  and pt's 51 you daughter, Vickii Penna who is at [employer] at [employer]. Pt has applied for disability years ago but denied. She gets up eats breakfast, meticulously cleans house and then watched movies all day. Normally a Night owl and does not like a lot of overhead lights and sunlight. Likes to sleep a lot during the day on and off per her Dimas Alexandria.  Home Assistive Devices / Equipment  Home Assistive Devices/Equipment: None  Home Equipment: None  Prior Functional Level  Prior Function  Level of Independence: Independent  Comments: lives with a cousin Lylith Bebeau pta and pt's 40 yo daughter, Vickii Penna  Current Functional Level    Cognition  Overall Cognitive Status: Impaired/Different from baseline  Current Attention Level: Selective  Orientation Level: Oriented to person;Oriented to place;Oriented to time  Safety/Judgement: Decreased awareness of safety;Decreased awareness of deficits  General Comments: unsure of baseline cognition. Pt has history of drug abuse    Extremity Assessment  (includes Sensation/Coordination)      ADLs  Overall ADL's : Needs assistance/impaired  Eating/Feeding: Minimal assistance;Sitting  Eating/Feeding Details (indicate cue type and reason): cues for rate/bite size and to use L hand  Grooming: Moderate assistance;Cueing for safety  Upper Body Bathing: Moderate assistance;Sitting  Lower Body Bathing: Moderate assistance;Cueing for safety  Upper Body Dressing : Moderate assistance  Lower Body Dressing: Maximal assistance  Toilet Transfer: Moderate assistance;Stand-pivot  Toileting- Clothing Manipulation and Hygiene: Maximal assistance  Functional mobility during ADLs: +2 for physical assistance;Maximal assistance (for ambulation. Mod A with stand pivot transfers)  General ADL Comments: Pt presents with ataxia, worse in RUE. Pt participated in activities to promote participation in ADLs including reaching for objects across midline with resistance of  theraband (orange) while sitting EOB. Pt also performed sit<>stand without achieving full stand to activate muscles in upper LEs.    Mobility  Overal bed mobility: Needs Assistance  Bed Mobility: Supine to Sit;Sit to Supine  Supine to sit: Min guard (for safety)  Sit to supine: Min assist (hand held assist to lay back in bed)  General bed mobility comments: verbal cues to move slowly and (A) to lay back in supine without flinging trunk backwards.    Transfers  Overall transfer level: Needs assistance  Equipment used: None  Transfers: Sit to/from Stand  Sit to Stand: Mod assist  Stand pivot transfers: Mod assist  General transfer comment: Verbal cues for pt to move slowly. Placed pt's hands on knees and assisted pt to power up halfway from seated position to activate upper thigh muscles. Pt engaged x3 sit<>stand without achieving full stand. Pt's bil knees were blocked to prevent buckling.    Ambulation / Gait / Stairs / Wheelchair Mobility  Ambulation/Gait  Ambulation/Gait assistance: Mod assist;+2 physical assistance;+2 safety/equipment  Ambulation Distance (Feet): 60 Feet (x2)  Assistive device: Rolling walker (2 wheeled)  Gait Pattern/deviations: Step-through pattern;Ataxic;Narrow base of support;Trunk flexed;Decreased stance time - right;Decreased stride length  General Gait Details: max v/c's for sequencing, max tactile cues at posterior hips to provide appropriate cueing to achieve sequencing and provide support/stability    Posture / Balance  Dynamic Sitting Balance  Sitting balance - Comments: trunkal ataxia    Special needs/care consideration  Bowel mgmt: continent  Bladder mgmt: foley  Bed alarm for lacks awareness of her deficits    Previous Home Environment  Living Arrangements: Other relatives;Other (Comment) (lives with her cousin Carely Nappier and pt's daughter, Sh)  Lives With: Family  Available Help at Discharge: Family  Type of Home: Apartment  Home Layout: One level   Home Access: Stairs to enter  Technical brewer of Steps: 3  Bathroom Shower/Tub: Administrator, Civil Service: Standard  Bathroom Accessibility: Yes  How Accessible: Accessible via walker  Weatherly: No  Discharge Living Setting  Plans for Discharge Living Setting: Other (Comment) (plans is for SNF until pt reaches Mod I level for 24/7 super)  Type of Home at Discharge: [place] Name at Discharge: TBA; [place] has not been initated per acute SW  Does the patient have any problems obtaining your medications?: Yes (Describe) (no insurance)  Aunt [name] is aware that SNF will be pursued as soon as inpt rehab goals met. Family unable to provide 24/7 supervision at this time.  Social/Family/Support Systems  Patient Roles: Parent  Contact Information: Sylvanna Burggraf is her Aunt with primary contact  Anticipated Caregiver: plans are for SNF after reaches goals for inpt rehab stay  Anticipated Caregiver's Contact Information: see above  Ability/Limitations of Caregiver: Sharlene Mccluskey is 5 yo Aunt who is caring for [name] with recent anuerysm procedure  Caregiver Availability: Intermittent  Discharge Plan Discussed with Primary Caregiver: Yes  Is Caregiver In Agreement with Plan?: Yes  Does Caregiver/Family have Issues with Lodging/Transportation while Pt is in Rehab?: No  Goals/Additional Needs  Patient/Family Goal for Rehab: min assist with PT, OT and SLP  Expected length of stay: ELOS 16 to 22 days or until inpt rehab goals met for continued rehab at [facility].  Dietary Needs: Dysphagia 2 diet with nectar thick liquids. Meds crushed in pureed  Equipment Needs: bed alarm for pt with decreased insight into deficits  Pt/Family Agrees to Admission and willing to participate: Yes  Program Orientation Provided & Reviewed with Pt/Caregiver Including Roles & Responsibilities: Yes  Decrease burden of Care through IP rehab admission: Diet advancement,  Decrease number of caregivers, Bowel and bladder program and Patient/family education. Dimas Alexandria aware that once diet upgraded, one cargiver support and bowel and bladder training that pt would be SNF. Also made aware that due to lack of pt funding that placement likely greater than 50 miles away from [place]. She is in agreement.  Possible need for SNF placement upon discharge:yes  Patient Condition: This patient's medical and functional status has changed since the consult dated: 08/23/2013 in which the Rehabilitation Physician determined and documented that the patient's condition is appropriate for intensive rehabilitative care in an inpatient rehabilitation facility. See "History of Present Illness" (above) for medical update. Functional changes are: overall max assist. Patient's medical and functional status update has been discussed with the Rehabilitation physician and patient remains appropriate for inpatient rehabilitation. Will admit to inpatient rehab today.  Preadmission Screen Completed By: Cleatrice Burke, 08/26/2013 12:02 PM  ______________________________________________________________________  Discussed status with Dr. Letta Pate on 08/26/2013 at 1159 and received telephone approval for admission today.  Admission Coordinator: Cleatrice Burke, time 1200 Date 08/26/2013      Cosigned by: Charlett Blake, MD [08/26/2013 1:02 PM]

## 2013-08-26 NOTE — Progress Notes (Signed)
Physical Medicine and Rehabilitation Consult  Reason for Consult: Dizziness, impaired balance, left sided weakness, severe dysarthria.  Referring Physician: Dr. Rita Ohara  HPI: [name] is a 58 y.o. female with history of Hep C, HTN, tobacco abuse who was admitted on 08/18/13 with worst HA of her life and fall with question LOC. She was transported by EMS and has emesis with SBP 230 enroute. She was intubated in ED and CT head with large cerebellar bleed with significant mass effect and probable tonsillar herniation and near complete obliteration of 4 th ventricle. UDS positive for ETOH and cocaine. She was started on mannitol as well as cardene drip and taken to OR emergently for suboccipital craniectomy and evacuation of cerebellar hematoma by Dr Kathyrn Sheriff. Extubated without difficulty on 05/21 but failed BSS. Patient with resultant dizzines, mild weakness left side, impaired balance, dysarthria and dysphagia. Neurology felt that patient had hemorrhage due to malignant hypertension as result of cocaine use and stroke workup underway. MBS done yesterday and patient started on Dysphaga 2, nectar liquids due to aspiration of thin. Therapy evaluations initiated and CIR recommended by MD and Rehab team.  Review of Systems  Eyes: Positive for blurred vision and double vision.  Respiratory: Negative for cough and shortness of breath.  Cardiovascular: Negative for chest pain and palpitations.  Gastrointestinal: Negative for heartburn and vomiting.  Musculoskeletal: Negative for myalgias.  Neurological: Positive for dizziness and headaches.   Past Medical History   Diagnosis  Date   .  Hepatitis C    .  Pneumonia    .  Hypertension     Past Surgical History   Procedure  Laterality  Date   .  Neck surgery     .  Shoulder surgery  Bilateral    .  Craniectomy  N/A  08/18/2013     Procedure: CRANIECTOMY POSTERIOR FOSSA DECOMPRESSION, EVACUATION OF HEMORRHAGE; Surgeon: Consuella Lose, MD;  Location: [facility]; Service: Neurosurgery; Laterality: N/A;    Family History   Problem  Relation  Age of Onset   .  Diabetes  Father    .  Diabetes  Sister     Social History: Lives with 48 year old daughter and elderly aunt. Unemployed. She reports that she has been smoking Cigarettes--2 PPD. She has a 80 pack-year smoking history. She has never used smokeless tobacco. She reports that she drinks about 27.6 ounces of alcohol per week. She reports that she uses illicit drugs (Cocaine).  Allergies   Allergen  Reactions   .  Ibuprofen  Hives and Other (See Comments)     Burning of the skin.    Medications Prior to Admission   Medication  Sig  Dispense  Refill   .  albuterol (PROVENTIL HFA;VENTOLIN HFA) 108 (90 BASE) MCG/ACT inhaler  Inhale 2 puffs into the lungs every 6 (six) hours as needed for wheezing.  3 Inhaler  3   .  FLUoxetine (PROZAC) 20 MG capsule  Take 1 capsule (20 mg total) by mouth daily.  30 capsule  3   .  hydrOXYzine (ATARAX/VISTARIL) 10 MG tablet  Take 1-2 tablets (10-20 mg total) by mouth every 8 (eight) hours as needed for itching.  45 tablet  0   .  metoprolol tartrate (LOPRESSOR) 25 MG tablet  Take 0.5 tablets (12.5 mg total) by mouth 2 (two) times daily.  60 tablet  5   .  nicotine (EQ NICOTINE) 14 mg/24hr patch  Place 1 patch (14 mg total) onto  the skin daily.  28 patch  0   .  ofloxacin (OCUFLOX) 0.3 % ophthalmic solution  Place 1 drop into both eyes 4 (four) times daily.  10 mL  2   .  predniSONE (DELTASONE) 20 MG tablet  Take 2 tablets (40 mg total) by mouth daily with breakfast.  10 tablet  0   .  terbinafine (LAMISIL AT) 1 % cream  Apply 1 application topically 2 (two) times daily.  30 g  3   .  zolpidem (AMBIEN) 5 MG tablet  Take 5 mg by mouth at bedtime as needed for sleep.      Home:  Home Living  Family/patient expects to be discharged to:: Private residence  Living Arrangements: Other relatives  Available Help at Discharge: Family  Type of Home:  (Townhome)  Home Access: Level entry  Home Layout: One level  Home Equipment: None  Functional History:  Prior Function  Level of Independence: Independent  Functional Status:  Mobility:  Bed Mobility  Overal bed mobility: +2 for physical assistance;Needs Assistance  Bed Mobility: Supine to Sit  Supine to sit: +2 for physical assistance  General bed mobility comments: (A) to elevate trunk OOB with max cues for technique.  Transfers  Overall transfer level: Needs assistance  Equipment used: 2 person hand held assist  Transfers: Stand Pivot Transfers  Stand pivot transfers: +2 physical assistance;Max assist  General transfer comment: (A) to initiate forward translation and trunk extension with manual cues to prevent bilateral LE knee buckling. Pt with poor coordination and continuous dizziness limiting overall transfer ability.    ADL:   Cognition:  Cognition  Overall Cognitive Status: Impaired/Different from baseline  Orientation Level: Oriented X4  Cognition  Arousal/Alertness: Awake/alert  Behavior During Therapy: Flat affect  Overall Cognitive Status: Impaired/Different from baseline  Area of Impairment: Problem solving;Attention  Current Attention Level: Alternating  Problem Solving: Slow processing;Difficulty sequencing  Blood pressure 146/89, pulse 84, temperature 98.1 F (36.7 C), temperature source Oral, resp. rate 20, height 5\' 7"  (1.702 m), weight 81.4 kg (179 lb 7.3 oz), SpO2 97.00%.  Physical Exam  Constitutional: She is oriented to person, place, and time. She appears well-developed and well-nourished.  HENT:  Head: Normocephalic.  Eyes: Conjunctivae are normal. Pupils are equal, round, and reactive to light.  Nystagmus right lateral and upward fields.  Neck: Normal range of motion. Neck supple.  Respiratory: Effort normal and breath sounds normal. No respiratory distress. She has no wheezes.  GI: Soft. Bowel sounds are normal. She exhibits no distension.  There is no tenderness.  Neurological: She is alert and oriented to person, place, and time.  Keeps eyes closed but able to follow basic commands without difficulty. She needed cues to slow down and enunciate due to moderate to severe dysarthria. Ataxia RUE and RLE. Impulsive with poor attention and decreased insight.  Skin: Skin is warm and dry.  Psychiatric:  Flat, doesn't want to engage   Results for orders placed during the hospital encounter of 08/18/13 (from the past 24 hour(s))   GLUCOSE, CAPILLARY Status: Abnormal    Collection Time    08/22/13 12:17 PM   Result  Value  Ref Range    Glucose-Capillary  108 (*)  70 - 99 mg/dL   GLUCOSE, CAPILLARY Status: Abnormal    Collection Time    08/22/13 4:06 PM   Result  Value  Ref Range    Glucose-Capillary  149 (*)  70 - 99 mg/dL   GLUCOSE, CAPILLARY  Status: Abnormal    Collection Time    08/22/13 7:35 PM   Result  Value  Ref Range    Glucose-Capillary  122 (*)  70 - 99 mg/dL   GLUCOSE, CAPILLARY Status: Abnormal    Collection Time    08/22/13 11:42 PM   Result  Value  Ref Range    Glucose-Capillary  113 (*)  70 - 99 mg/dL   GLUCOSE, CAPILLARY Status: Abnormal    Collection Time    08/23/13 1:10 AM   Result  Value  Ref Range    Glucose-Capillary  110 (*)  70 - 99 mg/dL   GLUCOSE, CAPILLARY Status: Abnormal    Collection Time    08/23/13 3:38 AM   Result  Value  Ref Range    Glucose-Capillary  155 (*)  70 - 99 mg/dL   GLUCOSE, CAPILLARY Status: Abnormal    Collection Time    08/23/13 8:43 AM   Result  Value  Ref Range    Glucose-Capillary  116 (*)  70 - 99 mg/dL    Dg Swallowing Func-speech Pathology  08/22/2013 Marcille Buffy, CCC-SLP 08/22/2013 5:47 PM Objective Swallowing Evaluation: Modified Barium Swallowing Study Patient Details Name: NUSRAT ENCARNACION MRN: [id number] Date of Birth: 1955/05/21 Today's Date: 08/22/2013 Time: 1100-1130 SLP Time Calculation (min): 30 min Past Medical History: Past Medical History Diagnosis  Date . Hepatitis C . Pneumonia . Hypertension Past Surgical History: Past Surgical History Procedure Laterality Date . Neck surgery . Shoulder surgery Bilateral . Craniectomy N/A 08/18/2013 Procedure: CRANIECTOMY POSTERIOR FOSSA DECOMPRESSION, EVACUATION OF HEMORRHAGE; Surgeon: Consuella Lose, MD; Location: [facility]; Service: Neurosurgery; Laterality: N/A; HPI: 58 year old female admitted 08/18/13 due to AMS with headache, fall and slurred speech. Pt intubated 5/20-21/15. PMH significant for HepC, PNA, HTN. CT revealed large posterior fossa hematoma with compression of the medulla/pons and effacement of the 4th ventricle Pt underwent posterior fossa decompression. MBS indicated to assess risk for aspiration and determine safest, PO diet. Assessment / Plan / Recommendation Clinical Impression Dysphagia Diagnosis: Moderate oral phase dysphagia;Moderate pharyngeal phase dysphagia Moderate sensory- motor oral dysphagia characterized by delayed oral transit, reduced posterior propulsion, (especially with whole barium tablet) and lingual weakness. Moderate sensory-motor pharyngeal dysphagia marked by decreased TBR and reduced airway closure. Suspected mechanical based dysphagia. Evidence of hardware at C4 to C-7. Appeared to be diverticulum at level of pyriforms but no radiologist present to confirm. Pre-spill to pyriforms with thin liquid by cup/straw and nectar by cup in larger amounts. Moderate amount of aspiration occurred during the swallow with thin liquid by cup/straw with no sensation. Instructed cough and throat clear ineffective to clear aspirated material. Once instance of aspiration during swallow with nectar by cup due to patient taking too large of amounts with swallows in succession in attempts to transit whole barium tablet. Cognitively unable to complete any compensatory strategy. Recommend to proceed with dysphagia 2 (finely chopped) and nectar thick liquids by cup sips only. Total assist with all  meals as aspiration risk remains secondary to decreased safety awareness. Continued ST in acute care setting for diet consistency management. Repeat MBS in 3 to 5 days with clinical improvement prior to liquid upgrade. Treatment Recommendation F/U MBS in __3-5 days with clinical improvement. Diagnostic treatment completed following evaluation focusing on providing caregivers diet recommendations and use of aspiration precautions. Diet Recommendation Dysphagia 2 (Fine chop);Nectar-thick liquid Liquid Administration via: Cup;No straw Medication Administration: Crushed with puree Supervision: Full supervision/cueing for compensatory strategies Compensations: Slow rate;Small  sips/bites;Multiple dry swallows after each bite/sip Postural Changes and/or Swallow Maneuvers: Out of bed for meals;Seated upright 90 degrees;Upright 30-60 min after meal Other Recommendations Oral Care Recommendations: Oral care Q4 per protocol Other Recommendations: Order thickener from pharmacy;Other (Comment);Have oral suction available;Clarify dietary restrictions;Prohibited food (jello, ice cream, thin soups);Remove water pitcher Follow Up Recommendations Inpatient Rehab Frequency and Duration min 2x/week 2 weeks SLP Swallow Goals General Date of Onset: 08/18/13 HPI: 58 year old female admitted 08/18/13 due to AMS with headache, fall and slurred speech. Pt intubated 5/20-21/15. PMH significant for HepC, PNA, HTN. CT revealed large posterior fossa hematoma with compression of the medulla/pons and effacement of the 4th ventricle Pt underwent posterior fossa decompression. BSE ordered to evaluate po readiness. Type of Study: Modified Barium Swallowing Study Reason for Referral: Objectively evaluate swallowing function Previous Swallow Assessment: BSE 08/20/13 NPO Diet Prior to this Study: NPO Temperature Spikes Noted: No Respiratory Status: Room air History of Recent Intubation: Yes Length of Intubations (days): 2 days Date extubated: 08/19/13  Behavior/Cognition: Alert;Cooperative;Confused;Distractible;Requires cueing;Decreased sustained attention Oral Cavity - Dentition: Edentulous Oral Motor / Sensory Function: Impaired - see Bedside swallow eval Self-Feeding Abilities: Able to feed self;Needs assist Patient Positioning: Upright in chair Baseline Vocal Quality: Clear Volitional Cough: Weak Volitional Swallow: Able to elicit Anatomy: Within functional limits Pharyngeal Secretions: Not observed secondary MBS Reason for Referral Objectively evaluate swallowing function Oral Phase Oral Preparation/Oral Phase Oral Phase: Impaired Oral - Nectar Oral - Nectar Teaspoon: Incomplete tongue to palate contact;Reduced posterior propulsion;Weak lingual manipulation;Piecemeal swallowing;Delayed oral transit Oral - Nectar Cup: Incomplete tongue to palate contact;Piecemeal swallowing;Reduced posterior propulsion;Weak lingual manipulation;Delayed oral transit Oral - Thin Oral - Thin Teaspoon: Weak lingual manipulation;Incomplete tongue to palate contact;Piecemeal swallowing Oral - Thin Cup: Incomplete tongue to palate contact;Reduced posterior propulsion;Delayed oral transit Oral - Solids Oral - Puree: Weak lingual manipulation;Piecemeal swallowing;Incomplete tongue to palate contact Oral - Mechanical Soft: Weak lingual manipulation;Delayed oral transit;Reduced posterior propulsion;Piecemeal swallowing;Incomplete tongue to palate contact Oral - Pill: Weak lingual manipulation;Delayed oral transit;Reduced posterior propulsion;Incomplete tongue to palate contact Pharyngeal Phase Pharyngeal Phase Pharyngeal Phase: Impaired Pharyngeal - Nectar Pharyngeal - Nectar Cup: Reduced pharyngeal peristalsis;Reduced tongue base retraction;Trace aspiration;Reduced airway/laryngeal closure;Premature spillage to valleculae;Reduced laryngeal elevation;Penetration/Aspiration during swallow Penetration/Aspiration details (nectar cup): Material enters airway, CONTACTS cords then ejected out  Pharyngeal - Thin Pharyngeal - Thin Teaspoon: Reduced pharyngeal peristalsis;Reduced tongue base retraction;Reduced laryngeal elevation;Reduced airway/laryngeal closure Pharyngeal - Thin Cup: Reduced tongue base retraction;Reduced pharyngeal peristalsis;Moderate aspiration;Penetration/Aspiration during swallow;Reduced laryngeal elevation;Reduced airway/laryngeal closure;Premature spillage to pyriform sinuses Penetration/Aspiration details (thin cup): Material enters airway, passes BELOW cords without attempt by patient to eject out (silent aspiration) Pharyngeal - Thin Straw: Moderate aspiration;Reduced tongue base retraction;Reduced pharyngeal peristalsis;Reduced laryngeal elevation;Reduced airway/laryngeal closure;Penetration/Aspiration during swallow;Premature spillage to pyriform sinuses Pharyngeal - Solids Pharyngeal - Puree: Reduced pharyngeal peristalsis;Reduced tongue base retraction;Reduced laryngeal elevation;Reduced airway/laryngeal closure Pharyngeal - Mechanical Soft: Reduced tongue base retraction;Reduced pharyngeal peristalsis;Reduced laryngeal elevation;Reduced airway/laryngeal closure Cervical Esophageal Phase GO Cervical Esophageal Phase Cervical Esophageal Phase: [facility] Sharman Crate MS, CCC-SLP [phone] Marcille Buffy 08/22/2013, 3:12 PM   Assessment/Plan:  Diagnosis: right paracentral cerebellar ICH  1. Does the need for close, 24 hr/day medical supervision in concert with the patient's rehab needs make it unreasonable for this patient to be served in a less intensive setting? Yes 2. Co-Morbidities requiring supervision/potential complications: htn, polysubstance abuse 3. Due to bladder management, bowel management, safety, skin/wound care, disease management, medication administration, pain management and patient education, does the patient require 24 hr/day  rehab nursing? Yes 4. Does the patient require coordinated care of a physician, rehab nurse, PT (1-2 hrs/day, 5 days/week), OT (1-2  hrs/day, 5 days/week) and SLP (1-2 hrs/day, 5 days/week) to address physical and functional deficits in the context of the above medical diagnosis(es)? Yes Addressing deficits in the following areas: balance, endurance, locomotion, strength, transferring, bowel/bladder control, bathing, dressing, feeding, grooming, toileting, cognition, speech, language, swallowing and psychosocial support 5. Can the patient actively participate in an intensive therapy program of at least 3 hrs of therapy per day at least 5 days per week? Yes 6. The potential for patient to make measurable gains while on inpatient rehab is excellent 7. Anticipated functional outcomes upon discharge from inpatient rehab are supervision and min assist with PT, supervision and min assist with OT, supervision and min assist with SLP. 8. Estimated rehab length of stay to reach the above functional goals is: 16-22 days 9. Does the patient have adequate social supports to accommodate these discharge functional goals? Yes and Potentially 10. Anticipated D/C setting: Home 11. Anticipated post D/C treatments: HH therapy and Outpatient therapy 12. Overall Rehab/Functional Prognosis: good RECOMMENDATIONS:  This patient's condition is appropriate for continued rehabilitative care in the following setting: CIR  Patient has agreed to participate in recommended program. Potentially  Note that insurance prior authorization may be required for reimbursement for recommended care.  Comment: Rehab Admissions Coordinator to follow up.  Thanks,  Meredith Staggers, MD, Mellody Drown  08/23/2013

## 2013-08-26 NOTE — PMR Pre-admission (Signed)
PMR Admission Coordinator Pre-Admission Assessment  Patient: [name] is an 58 y.o., female MRN: [id number] DOB: [birth year] Height: '5\' 6"'  (167.6 cm) Weight: 84.097 kg (185 lb 6.4 oz)              Insurance Information HMO:     PPO:      PCP:      IPA:      80/20:      OTHER:  PRIMARY:  Uninsured. Aunt [name] states pt has appealed a previous diabilty application placed years ago. I have notified financial counselor [name] that [name] can assist with disability and Medicaid pending applications. Pt does have a 73 yo daughter in the home. [name] [phone]  Medicaid Application Date: tba      Case Manager: tba Disability Application Date: tba      Case Worker: tba  Emergency Contact Information Contact Information   Name Relation Home Work Mobile   [name] Aunt   [place] [phone]     [name] Other   [phone]     Current Medical History  Patient Admitting Diagnosis: right paracentral cerebellar ICH  History of Present Illness:HPI: [name] is a 58 y.o. female with history of Hep C, HTN, tobacco abuse who was admitted on 08/18/13 with worst HA of her life and fall with question LOC. She was transported by EMS and has emesis with SBP 230 enroute. She was intubated in ED and CT head with large cerebellar bleed with significant mass effect and probable tonsillar herniation and near complete obliteration of 4 th ventricle. UDS positive for ETOH and cocaine.   She was started on mannitol as well as cardene drip and taken to OR emergently for suboccipital craniectomy and evacuation of cerebellar hematoma by Dr [name]. Extubated without difficulty on 05/21 but failed BSS. Patient with resultant dizzines, mild weakness left side, impaired balance, dysarthria and dysphagia. Neurology felt that patient had hemorrhage due to malignant hypertension as result of cocaine use and stroke workup underway. MBS done  yesterday and patient started on Dysphaga 2, nectar liquids due to aspiration of thin.    Total: 2 NIH    Past Medical History  Past Medical History  Diagnosis Date  . Hepatitis C   . Pneumonia   . Hypertension     Family History  family history includes Diabetes in her father and sister.  Prior Rehab/Hospitalizations: none  Current Medications  Current facility-administered medications:acetaminophen (TYLENOL) suppository 650 mg, 650 mg, Rectal, Q4H PRN, Vanessa Lose, MD;  acetaminophen (TYLENOL) tablet 650 mg, 650 mg, Oral, Q4H PRN, Vanessa Lose, MD;  HYDROcodone-acetaminophen (NORCO/VICODIN) 5-325 MG per tablet 1-2 tablet, 1-2 tablet, Oral, Q4H PRN, Vanessa Spangle, MD, 2 tablet at 08/25/13 2140 insulin aspart (novoLOG) injection 0-15 Units, 0-15 Units, Subcutaneous, 6 times per day, Vanessa Fudge, MD, 2 Units at 08/25/13 2140;  metoprolol tartrate (LOPRESSOR) tablet 25 mg, 25 mg, Oral, BID, Vanessa Heath, NP, 25 mg at 08/26/13 1057;  nicotine (NICODERM CQ - dosed in mg/24 hours) patch 14 mg, 14 mg, Transdermal, Daily, Vanessa Starch, NP, 14 mg at 08/25/13 0947 ofloxacin (OCUFLOX) 0.3 % ophthalmic solution 1 drop, 1 drop, Both Eyes, QID, Vanessa Starch, NP, 1 drop at 08/26/13 1103;  predniSONE (DELTASONE) tablet 30 mg, 30 mg, Oral, Q breakfast, [name] V, MD, 30 mg at 08/26/13 1057;  senna-docusate (Senokot-S) tablet 1 tablet, 1 tablet, Oral, BID, Vanessa Lose, MD, 1 tablet at 08/26/13 1057  Patients Current Diet: Dysphagia 2 diet with nectar thick liquids. Meds crushed and given in pureed  Precautions / Restrictions Precautions Precautions: Fall Precaution Comments: impulsive Restrictions Weight Bearing Restrictions: No   Prior Activity Level Community (5-7x/wk): nightowl type person; meticulous house cleaning, watches movies throughtout the day.Marland Kitchen Has not liked alot of light and sunlight for years. Pt lives with her cousin [name]  ([name]'s daughter) and pt's 29 you daughter, [name] who is at [employer] at [employer]. Pt has applied for disability years ago but denied. She gets up eats breakfast, meticulously cleans house and then watched movies all day. Normally a Night owl and does not like a lot of overhead lights and sunlight. Likes to sleep a lot during the day on and off per her [name].   Home Assistive Devices / Equipment Home Assistive Devices/Equipment: None Home Equipment: None  Prior Functional Level Prior Function Level of Independence: Independent Comments: lives with a cousin [name] pta and pt's 36 yo daughter, [name]  Current Functional Level Cognition  Overall Cognitive Status: Impaired/Different from baseline Current Attention Level: Selective Orientation Level: Oriented to person;Oriented to place;Oriented to time Safety/Judgement: Decreased awareness of safety;Decreased awareness of deficits General Comments: unsure of baseline cognition. Pt has history of drug abuse    Extremity Assessment (includes Sensation/Coordination)          ADLs  Overall ADL's : Needs assistance/impaired Eating/Feeding: Minimal assistance;Sitting Eating/Feeding Details (indicate cue type and reason): cues for rate/bite size and to use L hand Grooming: Moderate assistance;Cueing for safety Upper Body Bathing: Moderate assistance;Sitting Lower Body Bathing: Moderate assistance;Cueing for safety Upper Body Dressing : Moderate assistance Lower Body Dressing: Maximal assistance Toilet Transfer: Moderate assistance;Stand-pivot Toileting- Clothing Manipulation and Hygiene: Maximal assistance Functional mobility during ADLs: +2 for physical assistance;Maximal assistance (for ambulation. Mod A with stand pivot transfers) General ADL Comments: Pt presents with ataxia, worse in RUE. Pt participated in activities to promote participation in ADLs including reaching for objects across midline with resistance  of theraband (orange) while sitting EOB. Pt also performed sit<>stand without achieving full stand to activate muscles in upper LEs.     Mobility  Overal bed mobility: Needs Assistance Bed Mobility: Supine to Sit;Sit to Supine Supine to sit: Min guard (for safety) Sit to supine: Min assist (hand held assist to lay back in bed) General bed mobility comments: verbal cues to move slowly and (A) to lay back in supine without flinging trunk backwards.    Transfers  Overall transfer level: Needs assistance Equipment used: None Transfers: Sit to/from Stand Sit to Stand: Mod assist Stand pivot transfers: Mod assist General transfer comment: Verbal cues for pt to move slowly. Placed pt's hands on knees and assisted pt to power up halfway from seated position to activate upper thigh muscles. Pt engaged x3 sit<>stand without achieving full stand. Pt's bil knees were blocked to prevent buckling.    Ambulation / Gait / Stairs / Wheelchair Mobility  Ambulation/Gait Ambulation/Gait assistance: Mod assist;+2 physical assistance;+2 safety/equipment Ambulation Distance (Feet): 60 Feet (x2) Assistive device: Rolling walker (2 wheeled) Gait Pattern/deviations: Step-through pattern;Ataxic;Narrow base of support;Trunk flexed;Decreased stance time - right;Decreased stride length General Gait Details: max Peterson/c's for sequencing, max tactile cues at posterior hips to provide appropriate cueing to achieve sequencing and provide support/stability    Posture / Balance Dynamic Sitting Balance Sitting balance - Comments: trunkal ataxia    Special needs/care consideration Bowel mgmt: continent Bladder mgmt: foley Bed alarm for lacks awareness of her deficits  Previous Home Environment Living Arrangements: Other relatives;Other (Comment) (lives with her cousin [name] and pt's daughter, [name])  Lives With: Family Available Help at Discharge: Family Type of Home: Apartment Home Layout: One level Home  Access: Stairs to enter Technical brewer of Steps: 3 Bathroom Shower/Tub: Chiropodist: Standard Bathroom Accessibility: Yes How Accessible: Accessible via walker [place]: No  Discharge Living Setting Plans for Discharge Living Setting: Other (Comment) (plans is for SNF until pt reaches Mod I level for 24/7 super) Type of Home at Discharge: [place] Name at Discharge: TBA; [facility] has not been initated per acute SW Does the patient have any problems obtaining your medications?: Yes (Describe) (no insurance) Aunt [name] is aware that SNF will be pursued as soon as inpt rehab goals met. Family unable to provide 24/7 supervision at this time.  Social/Family/Support Systems Patient Roles: Parent Contact Information: [name] is her Aunt with primary contact Anticipated Caregiver: plans are for SNF after reaches goals for inpt rehab stay Anticipated Caregiver's Contact Information: see above Ability/Limitations of Caregiver: [name] is 72 yo Aunt who is caring for [name] with recent anuerysm procedure Caregiver Availability: Intermittent Discharge Plan Discussed with Primary Caregiver: Yes Is Caregiver In Agreement with Plan?: Yes Does Caregiver/Family have Issues with Lodging/Transportation while Pt is in Rehab?: No  Goals/Additional Needs Patient/Family Goal for Rehab: min assist with PT, OT and SLP Expected length of stay: [facility] 16 to 22 days or until inpt rehab goals met for continued rehab at [facility]. Dietary Needs: Dysphagia 2 diet with nectar thick liquids. Meds crushed in pureed Equipment Needs: bed alarm for pt with decreased insight into deficits Pt/Family Agrees to Admission and willing to participate: Yes Program Orientation Provided & Reviewed with Pt/Caregiver Including Roles  & Responsibilities: Yes   Decrease burden of Care through IP rehab admission: Diet advancement, Decrease number of  caregivers, Bowel and bladder program and Patient/family education. [name] aware that once diet upgraded, one cargiver support and bowel and bladder training that pt would be SNF. Also made aware that due to lack of pt funding that placement likely greater than 50 miles away from [place]. She is in agreement.  Possible need for SNF placement upon discharge:yes   Patient Condition: This patient's medical and functional status has changed since the consult dated: 08/23/2013 in which the Rehabilitation Physician determined and documented that the patient's condition is appropriate for intensive rehabilitative care in an inpatient rehabilitation facility. See "History of Present Illness" (above) for medical update. Functional changes are: overall max assist. Patient's medical and functional status update has been discussed with the Rehabilitation physician and patient remains appropriate for inpatient rehabilitation. Will admit to inpatient rehab today.  Preadmission Screen Completed By:  [name], 08/26/2013 12:02 PM ______________________________________________________________________   Discussed status with [name] on 08/26/2013 at  1159 and received telephone approval for admission today.  Admission Coordinator:  [name], time 1200 Date 08/26/2013

## 2013-08-26 NOTE — H&P (Signed)
Physical Medicine and Rehabilitation Admission H&P      Chief Complaint   Patient presents with   .  Dizziness, impaired balance, left sided weakness, severe dysarthria       HPI: [name] is a 58 y.o. Left handed female with history of Hep C, HTN, tobacco abuse who was admitted on 08/18/13 with worst HA of her life and fall with question LOC. She was transported by EMS and has emesis with SBP 230 enroute. She was intubated in ED and CT head with large cerebellar bleed with significant mass effect and probable tonsillar herniation and near complete obliteration of 4 th ventricle. UDS positive for ETOH and cocaine. She was started on mannitol as well as cardene drip and taken to OR emergently for suboccipital craniectomy and evacuation of cerebellar hematoma by Dr Kathyrn Sheriff. Extubated without difficulty on 05/21 but failed BSS. Patient with resultant dizzines, mild weakness left side, impaired balance, dysarthria and dysphagia. Neurology felt that patient had hemorrhage due to malignant hypertension as result of cocaine use and stroke workup underway. MBS done yesterday and patient started on Dysphaga 2, nectar liquids due to aspiration of thin. Patient with subsequent dizziness with HA, dysphagia, ataxic gait, impulsivity with poor safety as well as cognitive deficits. CIR recommended by Rehab team and patient admitted to CIR to help with progression and decrease burden of care.    Pt oriented to person and place, not time Difficulty following 2 step commands Dysarthric  Review of Systems  Eyes: Negative for blurred vision and double vision.  Respiratory: Positive for cough. Negative for shortness of breath.   Cardiovascular: Negative for chest pain and palpitations.  Gastrointestinal: Positive for constipation (No bm yet).  Genitourinary:        Foley still in place  Musculoskeletal: Positive for joint pain (chronic right knee pain).  Neurological: Positive for dizziness  (improving ), speech change and headaches (come and go).       Past Medical History   Diagnosis  Date   .  Hepatitis C     .  Pneumonia     .  Hypertension      Past Surgical History   Procedure  Laterality  Date   .  Neck surgery       .  Shoulder surgery  Bilateral     .  Craniectomy  N/A  08/18/2013       Procedure: CRANIECTOMY POSTERIOR FOSSA DECOMPRESSION, EVACUATION OF HEMORRHAGE;  Surgeon: Consuella Lose, MD;  Location: [facility];  Service: Neurosurgery;  Laterality: N/A;    Family History   Problem  Relation  Age of Onset   .  Diabetes  Father     .  Diabetes  Sister      Social History:  Currently alternates living with cousin and her elderly aunt ( who has recent surgery). Has 73 year old in high school.  Unemployed. She reports that she has been smoking Cigarettes--2 PPD. She has a 80 pack-year smoking history. She has never used smokeless tobacco. She reports that she drinks about 4-5 beers daily. Per  reports that she uses illicit drugs (Cocaine).         Allergies   Allergen  Reactions   .  Ibuprofen  Hives and Other (See Comments)       Burning of the skin.    Medications Prior to Admission   Medication  Sig  Dispense  Refill   .  albuterol (PROVENTIL HFA;VENTOLIN HFA) 108 (  90 BASE) MCG/ACT inhaler  Inhale 2 puffs into the lungs every 6 (six) hours as needed for wheezing.   3 Inhaler   3   .  FLUoxetine (PROZAC) 20 MG capsule  Take 1 capsule (20 mg total) by mouth daily.   30 capsule   3   .  hydrOXYzine (ATARAX/VISTARIL) 10 MG tablet  Take 1-2 tablets (10-20 mg total) by mouth every 8 (eight) hours as needed for itching.   45 tablet   0   .  metoprolol tartrate (LOPRESSOR) 25 MG tablet  Take 0.5 tablets (12.5 mg total) by mouth 2 (two) times daily.   60 tablet   5   .  nicotine (EQ NICOTINE) 14 mg/24hr patch  Place 1 patch (14 mg total) onto the skin daily.   28 patch   0   .  ofloxacin (OCUFLOX) 0.3 % ophthalmic solution  Place 1 drop into both eyes 4  (four) times daily.   10 mL   2   .  predniSONE (DELTASONE) 20 MG tablet  Take 2 tablets (40 mg total) by mouth daily with breakfast.   10 tablet   0   .  terbinafine (LAMISIL AT) 1 % cream  Apply 1 application topically 2 (two) times daily.   30 g   3   .  zolpidem (AMBIEN) 5 MG tablet  Take 5 mg by mouth at bedtime as needed for sleep.            Home: Home Living Family/patient expects to be discharged to:: Private residence Living Arrangements: Other relatives Available Help at Discharge: Family Type of Home: Apartment [place]) Home Access: Stairs to enter Technical brewer of Steps: 3 Home Layout: One level Home Equipment: None    Functional History: Prior Function Level of Independence: Independent   Functional Status:   Mobility: Bed Mobility Overal bed mobility: Needs Assistance Bed Mobility: Supine to Sit;Sit to Supine Supine to sit: Min guard (for safety) Sit to supine: Min assist (hand held assist to lay back in bed) General bed mobility comments: verbal cues to move slowly and (A) to lay back in supine without flinging trunk backwards. Transfers Overall transfer level: Needs assistance Equipment used: None Transfers: Sit to/from Stand Sit to Stand: Mod assist Stand pivot transfers: Mod assist General transfer comment: Verbal cues for pt to move slowly. Placed pt's hands on knees and assisted pt to power up halfway from seated position to activate upper thigh muscles. Pt engaged x3 sit<>stand without achieving full stand. Pt's bil knees were blocked to prevent buckling. Ambulation/Gait Ambulation/Gait assistance: Mod assist;+2 physical assistance;+2 safety/equipment Ambulation Distance (Feet): 60 Feet (x2) Assistive device: Rolling walker (2 wheeled) Gait Pattern/deviations: Step-through pattern;Ataxic;Narrow base of support;Trunk flexed;Decreased stance time - right;Decreased stride length General Gait Details: max v/c's for sequencing, max tactile cues  at posterior hips to provide appropriate cueing to achieve sequencing and provide support/stability   ADL: ADL Overall ADL's : Needs assistance/impaired Eating/Feeding: Minimal assistance;Sitting Eating/Feeding Details (indicate cue type and reason): cues for rate/bite size and to use L hand Grooming: Moderate assistance;Cueing for safety Upper Body Bathing: Moderate assistance;Sitting Lower Body Bathing: Moderate assistance;Cueing for safety Upper Body Dressing : Moderate assistance Lower Body Dressing: Maximal assistance Toilet Transfer: Moderate assistance;Stand-pivot Toileting- Clothing Manipulation and Hygiene: Maximal assistance Functional mobility during ADLs: +2 for physical assistance;Maximal assistance (for ambulation. Mod A with stand pivot transfers) General ADL Comments: Pt presents with ataxia, worse in RUE. Pt participated in activities to promote participation  in ADLs including reaching for objects across midline with resistance of theraband (orange) while sitting EOB. Pt also performed sit<>stand without achieving full stand to activate muscles in upper LEs.    Cognition: Cognition Overall Cognitive Status: Impaired/Different from baseline Orientation Level: Oriented to person;Oriented to place;Oriented to time Cognition Arousal/Alertness: Lethargic Behavior During Therapy: Impulsive Overall Cognitive Status: Impaired/Different from baseline Area of Impairment: Attention;Safety/judgement;Awareness;Problem solving Current Attention Level: Selective Safety/Judgement: Decreased awareness of safety;Decreased awareness of deficits Awareness: Emergent Problem Solving: Slow processing;Difficulty sequencing General Comments: unsure of baseline cognition. Pt has history of drug abuse   Physical Exam: Blood pressure 152/96, pulse 68, temperature 98.2 F (36.8 C), temperature source Oral, resp. rate 20, height 5\' 6"  (1.676 m), weight 84.097 kg (185 lb 6.4 oz), SpO2  97.00%. Physical Exam  Nursing note and vitals reviewed. Constitutional: She is oriented to person, place, and time. She appears well-developed and well-nourished.  HENT:   Head: Normocephalic and atraumatic.  adentulous  Eyes: Conjunctivae are normal. Pupils are equal, round, and reactive to light. Right eye exhibits no discharge and no exudate. Left eye exhibits no discharge and no exudate. Right conjunctiva is not injected. Right eye exhibits nystagmus. Left eye exhibits nystagmus.  Neck: Normal range of motion. Neck supple.  Cardiovascular: Normal rate and regular rhythm.   Respiratory: Effort normal and breath sounds normal.  GI: Soft. Bowel sounds are normal. She exhibits no distension. There is no tenderness.  Musculoskeletal: She exhibits no edema and no tenderness.  Neurological: She is alert and oriented to person, place, and time.  Tends to keeps eyes closed. Once engaged makes eye contact and interacts appropriately but impulsive and abrupt at times. She is  able to follow basic commands without difficulty.Difficulty with 2 step commands She continues to need  cues to slow down and enunciate due to moderate to severe dysarthria. Severe dysmetria bilateral finger nose finger, mod dymetria R HS,. Horizontal nystagmus right lateral field. Impulsive with poor attention and impaired insight  Skin: Skin is warm and dry. Rash (quater size patch on left shoulder. ) noted.   Motor is 5/5 in bilateral delt, bi, tri , grip, HF, KE, ADF Results for orders placed during the hospital encounter of 08/18/13 (from the past 48 hour(s))   GLUCOSE, CAPILLARY     Status: Abnormal     Collection Time      08/24/13 11:47 AM       Result  Value  Ref Range     Glucose-Capillary  144 (*)  70 - 99 mg/dL   GLUCOSE, CAPILLARY     Status: Abnormal     Collection Time      08/24/13  4:58 PM       Result  Value  Ref Range     Glucose-Capillary  149 (*)  70 - 99 mg/dL     Comment 1  Documented in Chart         Comment 2  Notify RN      GLUCOSE, CAPILLARY     Status: Abnormal     Collection Time      08/24/13  8:22 PM       Result  Value  Ref Range     Glucose-Capillary  162 (*)  70 - 99 mg/dL   GLUCOSE, CAPILLARY     Status: None     Collection Time      08/25/13 12:07 AM       Result  Value  Ref Range  Glucose-Capillary  94   70 - 99 mg/dL   GLUCOSE, CAPILLARY     Status: None     Collection Time      08/25/13  5:01 AM       Result  Value  Ref Range     Glucose-Capillary  98   70 - 99 mg/dL   GLUCOSE, CAPILLARY     Status: Abnormal     Collection Time      08/25/13  6:47 AM       Result  Value  Ref Range     Glucose-Capillary  114 (*)  70 - 99 mg/dL   GLUCOSE, CAPILLARY     Status: Abnormal     Collection Time      08/25/13  8:46 AM       Result  Value  Ref Range     Glucose-Capillary  117 (*)  70 - 99 mg/dL     Comment 1  Documented in Chart        Comment 2  Notify RN      GLUCOSE, CAPILLARY     Status: Abnormal     Collection Time      08/25/13 12:02 PM       Result  Value  Ref Range     Glucose-Capillary  191 (*)  70 - 99 mg/dL   GLUCOSE, CAPILLARY     Status: Abnormal     Collection Time      08/25/13  5:25 PM       Result  Value  Ref Range     Glucose-Capillary  162 (*)  70 - 99 mg/dL   GLUCOSE, CAPILLARY     Status: Abnormal     Collection Time      08/25/13  7:21 PM       Result  Value  Ref Range     Glucose-Capillary  146 (*)  70 - 99 mg/dL     Comment 1  Notify RN        Comment 2  Documented in Chart      GLUCOSE, CAPILLARY     Status: Abnormal     Collection Time      08/26/13 12:30 AM       Result  Value  Ref Range     Glucose-Capillary  115 (*)  70 - 99 mg/dL   GLUCOSE, CAPILLARY     Status: Abnormal     Collection Time      08/26/13  4:19 AM       Result  Value  Ref Range     Glucose-Capillary  117 (*)  70 - 99 mg/dL   GLUCOSE, CAPILLARY     Status: Abnormal     Collection Time      08/26/13  7:48 AM       Result  Value  Ref Range      Glucose-Capillary  100 (*)  70 - 99 mg/dL     Comment 1  Documented in Chart       No results found.         Medical Problem List and Plan: 1. Functional deficits secondary to Right paracentral cerebellar ICH 2.  DVT Prophylaxis/Anticoagulation: Mechanical: Antiembolism stockings, knee (TED hose) Bilateral lower extremities Sequential compression devices, below knee Bilateral lower extremities 3. Pain Management:  Will continue vicodin prn. 4. H/o depression/Mood:  Has been on prozac in the past. She is showing signs of adjustment disorder therefore will resume this.  LCSW to follow for evaluation and support.   5. Neuropsych: This patient is not capable of making decisions on her own behalf. 6. HTN: Will monitor with checks every 8 hours and titrate medications as indicated. Continue lopressor bid.   7. Hep C: Not a candidate for treatment due to ongoing drug abuse.   8. H/o gastritis: will add Pepcid. Poor po intake due to dislike of food.   9.  H/o Knee pain: Add voltaren gel if needed..   10. Abnormal LFTs: Will recheck in am.   11. Hypokelmia: Check follow up labs in am. Supplement if needed.   12. Thrombocytopenia: Will continue to monitor platelets as well as any signs of bleeding. Recheck in am and then twice a week. Current trend 104-->98-->99--->119.   13. Recent diffuse rash: Will wean off steroids as almost resolved.  Hyperglycemia should hopefully improve off steroids. Hgb A1C-5.0           Post Admission Physician Evaluation: Functional deficits secondary  to Right> Left paracentral cerebellar ICH  Patient is admitted to receive collaborative, interdisciplinary care between the physiatrist, rehab nursing staff, and therapy team. Patient's level of medical complexity and substantial therapy needs in context of that medical necessity cannot be provided at a lesser intensity of care such as a SNF. Patient has experienced substantial functional loss from his/her  baseline which was documented above under the "Functional History" and "Functional Status" headings.  Judging by the patient's diagnosis, physical exam, and functional history, the patient has potential for functional progress which will result in measurable gains while on inpatient rehab.  These gains will be of substantial and practical use upon discharge  in facilitating mobility and self-care at the household level. Physiatrist will provide 24 hour management of medical needs as well as oversight of the therapy plan/treatment and provide guidance as appropriate regarding the interaction of the two. 24 hour rehab nursing will assist with bladder management, bowel management, safety, skin/wound care, disease management, medication administration and patient education  and help integrate therapy concepts, techniques,education, etc. PT will assess and treat for/with: pre gait, gait training, endurance , safety, equipment, neuromuscular re education.   Goals are: Mod A mob. OT will assess and treat for/with: ADLs, Cognitive perceptual skills, Neuromuscular re education, safety, endurance, equipment.   Goals are: Mod A UE ADL, Max A LE ADL. SLP will assess and treat for/with: .  Goals are: safe adn adequate po intake, 75%  Speech intelligibiilty. Case Management and Social Worker will assess and treat for psychological issues and discharge planning. Team conference will be held weekly to assess progress toward goals and to determine barriers to discharge. Patient will receive at least 3 hours of therapy per day at least 5 days per week. ELOS: 15-20 days to get to an assist of 1 person        Prognosis:  fair    Charlett Blake M.D. [facility] FAAPM&R (Sports Med, Neuromuscular Med) Diplomate Am Board of Electrodiagnostic Med  08/26/2013

## 2013-08-26 NOTE — Progress Notes (Signed)
I spoke with pt at bedside and then contacted her [name], [name], by phone. Patient in agreement to admission to inpt rehab today as well as her [name]. I have contacted Dr. Cleotilde Neer office of planned admit today. RN CM aware. I will arrange. [phone]

## 2013-08-26 NOTE — Progress Notes (Signed)
Physical Therapy Treatment Patient Details Name: [name] MRN: [id number] DOB: 10/23/1955 Today's Date: 08/26/2013    History of Present Illness 58 year old female admitted 08/18/13 due to AMS with headache, fall and slurred speech. Pt intubated 5/20-21/15. PMH significant for HepC, PNA, HTN. CT revealed large posterior fossa hematoma with compression of the medulla/pons and effacement of the 4th ventricle  Pt underwent posterior fossa decompression on 08/18/13.     PT Comments    Pt progressing well with her mobility.  She was able to walk with one person mod to max assist with RW today.  She needs cues to slow down speed of movement, especially when attempting to turn with RW.  She will likely d/c to CIR today.  PT will continue to follow acutely.  Follow Up Recommendations  CIR     Equipment Recommendations  Rolling walker with 5" wheels;Wheelchair (measurements PT);Wheelchair cushion (measurements PT)    Recommendations for Other Services  NA     Precautions / Restrictions Precautions Precautions: Fall Precaution Comments: impulsive, decreased spatial awareness    Mobility  Bed Mobility Overal bed mobility: Needs Assistance Bed Mobility: Supine to Sit     Supine to sit: Supervision     General bed mobility comments: Supervision for safety due to speed of movement and legs entangled in bed sheets  Transfers Overall transfer level: Needs assistance Equipment used: Rolling walker (2 wheeled) Transfers: Sit to/from Stand Sit to Stand: Min assist Stand pivot transfers: Min assist       General transfer comment: For just standing and pivoting pt in now min assist when assisted at trunk for blanace and just turning with walker to sit in the chair.  She needs cues for slower speed of movement and safe hand placement and needs assist to maneuver RW out of her way.  Ambulation/Gait Ambulation/Gait assistance: Mod assist;Max assist Ambulation Distance (Feet): 55  Feet Assistive device: Rolling walker (2 wheeled) Gait Pattern/deviations: Step-through pattern;Ataxic;Trunk flexed Gait velocity: too fast for safety   General Gait Details: Pt moving too quickly to be safe with gait with RW.  needs cues to slow down steps and sequencing of gait especially while turning.  Walking makes her very dizzy as well.  Will need to try to initiate compensatory strategies during gait to help with dizziness.  At times pt's trunk gets too far forward of her body.  Ataxic gait pattern.       Modified Rankin (Stroke Patients Only) Modified Rankin (Stroke Patients Only) Pre-Morbid Rankin Score: No symptoms Modified Rankin: Moderately severe disability     Balance Overall balance assessment: Needs assistance Sitting-balance support: Feet supported Sitting balance-Leahy Scale: Good Sitting balance - Comments: in sitting with back unsupported worked on fast target reaching with bil upper extremities. Pt seemed more ataxic with her right upper extremity.  Good speed, no significant LOB reaching all directions outside of BOS.    Standing balance support: Bilateral upper extremity supported Standing balance-Leahy Scale: Poor                      Cognition Arousal/Alertness: Lethargic (eyes closed, may be due to dizziness) Behavior During Therapy: Impulsive;Flat affect (very quick to move) Overall Cognitive Status: Impaired/Different from baseline Area of Impairment: Attention;Safety/judgement;Awareness;Problem solving   Current Attention Level: Selective Memory: Decreased recall of precautions   Safety/Judgement: Decreased awareness of safety;Decreased awareness of deficits Awareness: Emergent Problem Solving: Difficulty sequencing;Requires verbal cues;Requires tactile cues      Exercises General  Exercises - Upper Extremity Shoulder Flexion: AROM;Both;10 reps;Seated Elbow Flexion: AROM;Both;10 reps;Seated General Exercises - Lower Extremity Long Arc  Quad: AROM;Both;10 reps;Seated Hip Flexion/Marching: AROM;Both;10 reps;Seated        Pertinent Vitals/Pain See vitals flow sheet.            PT Goals (current goals can now be found in the care plan section) Acute Rehab PT Goals Patient Stated Goal: none stated, seemed happy to be going to rehab.  Progress towards PT goals: Progressing toward goals    Frequency  Min 4X/week    PT Plan Current plan remains appropriate       End of Session Equipment Utilized During Treatment: Gait belt Activity Tolerance: Patient limited by fatigue Patient left: in chair;with call bell/phone within reach;with chair alarm set     Time: 2637-8588 PT Time Calculation (min): 14 min  Charges:  $Gait Training: 8-22 mins                      Eean Buss B. Lydia, [place], DPT [phone]   08/26/2013, 4:00 PM

## 2013-08-26 NOTE — Progress Notes (Signed)
Patient information reviewed and entered into eRehab system by Kalab Camps, RN, CRRN, PPS Coordinator.  Information including medical coding and functional independence measure will be reviewed and updated through discharge.    

## 2013-08-27 ENCOUNTER — Inpatient Hospital Stay (HOSPITAL_COMMUNITY): Payer: No Typology Code available for payment source

## 2013-08-27 ENCOUNTER — Encounter (HOSPITAL_COMMUNITY): Payer: Self-pay | Admitting: *Deleted

## 2013-08-27 ENCOUNTER — Inpatient Hospital Stay (HOSPITAL_COMMUNITY): Payer: No Typology Code available for payment source | Admitting: Occupational Therapy

## 2013-08-27 ENCOUNTER — Inpatient Hospital Stay (HOSPITAL_COMMUNITY): Payer: Medicaid Other | Admitting: Speech Pathology

## 2013-08-27 DIAGNOSIS — I619 Nontraumatic intracerebral hemorrhage, unspecified: Secondary | ICD-10-CM

## 2013-08-27 DIAGNOSIS — I69993 Ataxia following unspecified cerebrovascular disease: Secondary | ICD-10-CM

## 2013-08-27 LAB — GLUCOSE, CAPILLARY
GLUCOSE-CAPILLARY: 82 mg/dL (ref 70–99)
GLUCOSE-CAPILLARY: 109 mg/dL — AB (ref 70–99)
Glucose-Capillary: 105 mg/dL — ABNORMAL HIGH (ref 70–99)
Glucose-Capillary: 110 mg/dL — ABNORMAL HIGH (ref 70–99)
Glucose-Capillary: 142 mg/dL — ABNORMAL HIGH (ref 70–99)
Glucose-Capillary: 175 mg/dL — ABNORMAL HIGH (ref 70–99)

## 2013-08-27 LAB — CBC WITH DIFFERENTIAL/PLATELET
Basophils Absolute: 0 10*3/uL (ref 0.0–0.1)
Basophils Relative: 0 % (ref 0–1)
EOS ABS: 0.2 10*3/uL (ref 0.0–0.7)
Eosinophils Relative: 2 % (ref 0–5)
HCT: 38.7 % (ref 36.0–46.0)
Hemoglobin: 13.2 g/dL (ref 12.0–15.0)
Lymphocytes Relative: 36 % (ref 12–46)
Lymphs Abs: 4 10*3/uL (ref 0.7–4.0)
MCH: 36.4 pg — ABNORMAL HIGH (ref 26.0–34.0)
MCHC: 34.1 g/dL (ref 30.0–36.0)
MCV: 106.6 fL — ABNORMAL HIGH (ref 78.0–100.0)
Monocytes Absolute: 0.7 10*3/uL (ref 0.1–1.0)
Monocytes Relative: 7 % (ref 3–12)
NEUTROS ABS: 6 10*3/uL (ref 1.7–7.7)
Neutrophils Relative %: 55 % (ref 43–77)
PLATELETS: 154 10*3/uL (ref 150–400)
RBC: 3.63 MIL/uL — ABNORMAL LOW (ref 3.87–5.11)
RDW: 12.3 % (ref 11.5–15.5)
WBC: 10.9 10*3/uL — AB (ref 4.0–10.5)

## 2013-08-27 LAB — URINALYSIS, ROUTINE W REFLEX MICROSCOPIC
Glucose, UA: NEGATIVE mg/dL
KETONES UR: NEGATIVE mg/dL
NITRITE: NEGATIVE
Protein, ur: NEGATIVE mg/dL
Specific Gravity, Urine: 1.028 (ref 1.005–1.030)
Urobilinogen, UA: 4 mg/dL — ABNORMAL HIGH (ref 0.0–1.0)
pH: 6 (ref 5.0–8.0)

## 2013-08-27 LAB — COMPREHENSIVE METABOLIC PANEL
ALK PHOS: 72 U/L (ref 39–117)
ALT: 125 U/L — AB (ref 0–35)
AST: 82 U/L — ABNORMAL HIGH (ref 0–37)
Albumin: 2.6 g/dL — ABNORMAL LOW (ref 3.5–5.2)
BILIRUBIN TOTAL: 0.9 mg/dL (ref 0.3–1.2)
BUN: 13 mg/dL (ref 6–23)
CHLORIDE: 105 meq/L (ref 96–112)
CO2: 29 mEq/L (ref 19–32)
Calcium: 9.5 mg/dL (ref 8.4–10.5)
Creatinine, Ser: 0.69 mg/dL (ref 0.50–1.10)
GFR calc non Af Amer: >90 mL/min (ref >90)
GLUCOSE: 91 mg/dL (ref 70–99)
POTASSIUM: 4.2 meq/L (ref 3.7–5.3)
SODIUM: 142 meq/L (ref 137–147)
TOTAL PROTEIN: 6.1 g/dL (ref 6.0–8.3)

## 2013-08-27 LAB — URINE MICROSCOPIC-ADD ON

## 2013-08-27 MED ORDER — BIOTENE DRY MOUTH MT LIQD
15.0000 mL | Freq: Two times a day (BID) | OROMUCOSAL | Status: DC
  Administered 2013-08-27 – 2013-09-08 (×23): 15 mL via OROMUCOSAL

## 2013-08-27 MED ORDER — LIDOCAINE HCL 2 % EX GEL
CUTANEOUS | Status: DC | PRN
  Filled 2013-08-27: qty 5

## 2013-08-27 MED ORDER — FLUOXETINE HCL 10 MG PO CAPS
10.0000 mg | ORAL_CAPSULE | Freq: Every day | ORAL | Status: DC
  Administered 2013-08-27 – 2013-08-29 (×3): 10 mg via ORAL
  Filled 2013-08-27 (×5): qty 1

## 2013-08-27 MED ORDER — PNEUMOCOCCAL VAC POLYVALENT 25 MCG/0.5ML IJ INJ
0.5000 mL | INJECTION | INTRAMUSCULAR | Status: AC
  Administered 2013-08-28: 0.5 mL via INTRAMUSCULAR
  Filled 2013-08-27: qty 0.5

## 2013-08-27 NOTE — Progress Notes (Signed)
Occupational Therapy Note  Patient Details  Name: [name] MRN: [id number] Date of Birth: 1955-11-23 Today's Date: 08/27/2013  Time: 2035-5974 Pt denied pain Individual Therapy  Pt resting in bed with eyes closed but easily aroused.  Pt sat EOB with min encouragement but stated she was too tired to do anything.  Pt refused to walk.  Pt stated reason for being in hospital then recounted answers to numerous questions asked in earlier session (living arrangement, how many steps, employment, etc.). When asked what she needed to work on patient stated sleep and said "see" while raising BUE and LUE to demonstrate that everything was moving ok.  Pt refused to get OOB despite max encouragement from therapist.  Focus on bed mobility, orientation, education on therapy goals and criteria for inpatient rehab.    Leroy Libman 08/27/2013, 2:11 PM

## 2013-08-27 NOTE — Evaluation (Signed)
Speech Language Pathology Assessment and Plan  Patient Details  Name: [name] MRN: [id number] Date of Birth: Feb 11, 1956  SLP Diagnosis: Cognitive Impairments;Dysarthria;Dysphagia  Rehab Potential: Fair ELOS: 10-12 days   Today's Date: 08/27/2013 Time: 0900-0940 Time Calculation (min): 40 min  Problem List:  Patient Active Problem List   Diagnosis Date Noted  . ICH (intracerebral hemorrhage) 08/26/2013  . Cytotoxic cerebral edema 08/20/2013  . Brain herniation 08/20/2013  . Cerebellar hemorrhage, nontraumatic 08/18/2013  . Acute respiratory failure with hypoxia 08/18/2013  . Hepatitis C infection 08/03/2013  . Nausea with vomiting 08/03/2013  . Loss of weight 08/03/2013  . Arthritis 05/03/2013  . Influenza with respiratory manifestations 05/03/2013  . Acute hepatitis C virus infection without hepatic coma 05/03/2013   Past Medical History:  Past Medical History  Diagnosis Date  . Hepatitis C   . Pneumonia   . Hypertension   . Diabetes mellitus without complication    Past Surgical History:  Past Surgical History  Procedure Laterality Date  . Neck surgery    . Shoulder surgery Bilateral   . Craniectomy N/A 08/18/2013    Procedure: CRANIECTOMY POSTERIOR FOSSA DECOMPRESSION, EVACUATION OF HEMORRHAGE;  Surgeon: Consuella Lose, MD;  Location: [facility];  Service: Neurosurgery;  Laterality: N/A;    Assessment / Plan / Recommendation Clinical Impression Pt is a 58 y.o. left handed female with history of Hep C, HTN and tobacco abuse who was admitted on 08/18/13 with worst HA of [name] life and fall with question LOC. She was transported by EMS and had emesis with SBP 230 enroute. She was intubated in ED and CT head with large cerebellar bleed with significant mass effect and probable tonsillar herniation and near complete obliteration of 4 th ventricle. UDS positive for ETOH and cocaine. She was started on mannitol as well as cardene drip and taken to OR emergently  for suboccipital craniectomy and evacuation of cerebellar hematoma by Dr Kathyrn Sheriff. Extubated without difficulty on 05/21. Patient with resultant dizziness, mild weakness left side, impaired balance, dysarthria and dysphagia. Neurology felt that patient had hemorrhage due to malignant hypertension as result of cocaine use. MBS completed 5/25 and patient started on Dysphaga 2 textures with nectar thick liquids. CIR recommended by Rehab team and patient admitted to CIR to decrease burden of care. Patient transferred to CIR on 08/26/2013 and administered a cognitive-linguistic evaluation and demonstrates moderate cognitive impairments characterized by decreased initiation, attention, problem solving, awareness and safety as well as impulsivity which impacts Vanessa ability to perform basic and familiar tasks safely. Pt also demonstrates a moderate ataxic dysarthria and requires verbal cues to decrease speech rate to increase intelligibility and overall functional communication. Pt was also administered a limited BSE due to participation and did not demonstrate any overt s/s of aspiration with current diet of Dys. 2 textures with nectar-thick liquids but required verbal cues to decrease rate of self-feeding. Recommend pt continue current diet with full supervision to increase safety and minimize aspiration risk. Pt would benefit from skilled SLP intervention to maximize cognitive and swallowing function and functional communication in order to reduce caregiver burden. Anticipate pt will require 24 hour supervision and f/u SLP services.   Skilled Therapeutic Interventions          Administered a cognitive-linguistic evaluation and BSE. Please see above for details. Pt missed 20 minutes of skilled SLP intervention due to fatigue and refusal to participate.   SLP Assessment  Patient will need skilled Speech Lanaguage Pathology Services during CIR admission  Recommendations  Diet Recommendations: Dysphagia 2 (Fine  chop);Nectar-thick liquid Liquid Administration via: Cup;No straw Medication Administration: Crushed with puree Supervision: Full supervision/cueing for compensatory strategies;Staff to assist with self feeding Compensations: Slow rate;Small sips/bites;Multiple dry swallows after each bite/sip Postural Changes and/or Swallow Maneuvers: Out of bed for meals;Seated upright 90 degrees;Upright 30-60 min after meal Oral Care Recommendations: Oral care BID Recommendations for Other Services: Neuropsych consult Patient destination: Home (vs. SNF) Follow up Recommendations: Home Health SLP;24 hour supervision/assistance;Skilled Nursing facility Equipment Recommended: None recommended by SLP    SLP Frequency 5 out of 7 days   SLP Treatment/Interventions Cognitive remediation/compensation;Cueing hierarchy;Environmental controls;Internal/external aids;Speech/Language facilitation;Patient/family education;Therapeutic Activities;Functional tasks;Dysphagia/aspiration precaution training    Pain Pain Assessment Pain Assessment: No/denies pain  Short Term Goals: Week 1: SLP Short Term Goal 1 (Week 1): Pt will consume current diet with Mod A verbal cues to minimize overt s/s of aspiration.  SLP Short Term Goal 2 (Week 1): Pt will utilize a slow rate of speech to increase speech intelligibility with Min multimodal cues.  SLP Short Term Goal 3 (Week 1): Pt will demonstrate sustained attention to a functional task for 15 minutes with Mod A verbal cues for redirection. SLP Short Term Goal 4 (Week 1): Pt will identify 2 cognitive impairments with Mod multimodal cues.  SLP Short Term Goal 5 (Week 1): Pt will utilize call bell to request assistance with Min multimodal cues.  SLP Short Term Goal 6 (Week 1): Pt will demonstrate functional problem solving for basic and familiar tasks with Mod A multimodal cues.   See FIM for current functional status Refer to Care Plan for Long Term Goals  Recommendations for  other services: Neuropsych  Discharge Criteria: Patient will be discharged from SLP if patient refuses treatment 3 consecutive times without medical reason, if treatment goals not met, if there is a change in medical status, if patient makes no progress towards goals or if patient is discharged from hospital.  The above assessment, treatment plan, treatment alternatives and goals were discussed and mutually agreed upon: by patient  Buzzy Han 08/27/2013, 5:03 PM

## 2013-08-27 NOTE — IPOC Note (Signed)
Overall Plan of Care [facility]) Patient Details Name: [name] MRN: [id number] DOB: Nov 16, 1955  Admitting Diagnosis: R cerebellar hemm  Hospital Problems: Active Problems:   ICH (intracerebral hemorrhage)     Functional Problem List: Nursing    PT Balance;Endurance;Motor;Perception;Safety;Sensory  OT Balance;Behavior;Cognition;Endurance;Motor;Perception;Safety;Vision  SLP Cognition  TR         Basic ADL's: OT Eating;Grooming;Bathing;Dressing;Toileting     Advanced  ADL's: OT Simple Meal Preparation;Light Housekeeping     Transfers: PT Bed Mobility;Bed to Chair;Car;Furniture;Floor  OT Toilet;Tub/Shower     Locomotion: PT Ambulation;Wheelchair Mobility;Stairs     Additional Impairments: OT None  SLP Swallowing;Communication;Social Cognition expression Social Interaction;Problem Solving;Memory;Attention;Awareness  TR      Anticipated Outcomes Item Anticipated Outcome  Self Feeding Mod I   Swallowing  Min A with least restrictive diet   Basic self-care  Supervision  Toileting  Supervision   Bathroom Transfers Supervision  Bowel/Bladder     Transfers  Supervision  Locomotion  Supervision  Communication  Min A  Cognition  Min A  Pain     Safety/Judgment      Therapy Plan: PT Intensity: Minimum of 1-2 x/day ,45 to 90 minutes PT Frequency: 5 out of 7 days PT Duration Estimated Length of Stay: 10-12days OT Intensity: Minimum of 1-2 x/day, 45 to 90 minutes OT Frequency: 5 out of 7 days OT Duration/Estimated Length of Stay: 10-12 days SLP Intensity: Minumum of 1-2 x/day, 30 to 90 minutes SLP Frequency: 5 out of 7 days SLP Duration/Estimated Length of Stay: 10-12 days       Team Interventions: Nursing Interventions    PT interventions Ambulation/gait training;Balance/vestibular training;Discharge planning;DME/adaptive equipment instruction;Functional mobility training;Disease management/prevention;Cognitive  remediation/compensation;Patient/family education;Therapeutic Exercise;Visual/perceptual remediation/compensation;UE/LE Coordination activities;Therapeutic Activities;Pain management;Wheelchair propulsion/positioning;UE/LE Strength taining/ROM;Stair training;Psychosocial support;Neuromuscular re-education  OT Interventions Balance/vestibular training;Self Care/advanced ADL retraining;UE/LE Coordination activities;Visual/perceptual remediation/compensation;Cognitive remediation/compensation;Functional mobility training;Community reintegration;Therapeutic Activities;Discharge planning;Patient/family education;Therapeutic Exercise;UE/LE Strength taining/ROM;DME/adaptive equipment instruction  SLP Interventions Cognitive remediation/compensation;Cueing hierarchy;Environmental controls;Internal/external aids;Speech/Language facilitation;Patient/family education;Therapeutic Activities;Functional tasks;Dysphagia/aspiration precaution training  TR Interventions    SW/CM Interventions      Team Discharge Planning: Destination: PT-Skilled Nursing Facility (SNF) ,[facility] (SNF) , SLP-Home (vs. SNF) Projected Follow-up: PT-Skilled nursing facility;24 hour supervision/assistance, OT-  24 hour supervision/assistance;Skilled nursing facility, SLP-Home Health SLP;24 hour supervision/assistance;Skilled Nursing facility Projected Equipment Needs: PT-To be determined;Rolling walker with 5" wheels, OT- To be determined, SLP-None recommended by SLP Equipment Details: PT- , OT-  Patient/family involved in discharge planning: PT- Patient,  OT-Patient, SLP-Patient  MD ELOS: 10-12 days Medical Rehab Prognosis:  Excellent Assessment: The patient has been admitted for CIR therapies with the diagnosis of cerebellar hemorrhage. The team will be addressing functional mobility, strength, stamina, balance, safety, adaptive techniques and equipment, self-care, bowel and bladder mgt, patient and caregiver  education, coordination, vestibular rx, ego-support, stroke education, nutrition, swallowing, speech, and cognition. Goals have been set at supervision to min assist with mobility and self-care, cognition, and swallowing/communication.    Meredith Staggers, MD, FAAPMR      See Team Conference Notes for weekly updates to the plan of care

## 2013-08-27 NOTE — H&P (View-Only) (Signed)
Physical Medicine and Rehabilitation Admission H&P      Chief Complaint   Patient presents with   .  Dizziness, impaired balance, left sided weakness, severe dysarthria       HPI: [name] is a 58 y.o. Left handed female with history of Hep C, HTN, tobacco abuse who was admitted on 08/18/13 with worst HA of her life and fall with question LOC. She was transported by EMS and has emesis with SBP 230 enroute. She was intubated in ED and CT head with large cerebellar bleed with significant mass effect and probable tonsillar herniation and near complete obliteration of 4 th ventricle. UDS positive for ETOH and cocaine. She was started on mannitol as well as cardene drip and taken to OR emergently for suboccipital craniectomy and evacuation of cerebellar hematoma by Dr Kathyrn Sheriff. Extubated without difficulty on 05/21 but failed BSS. Patient with resultant dizzines, mild weakness left side, impaired balance, dysarthria and dysphagia. Neurology felt that patient had hemorrhage due to malignant hypertension as result of cocaine use and stroke workup underway. MBS done yesterday and patient started on Dysphaga 2, nectar liquids due to aspiration of thin. Patient with subsequent dizziness with HA, dysphagia, ataxic gait, impulsivity with poor safety as well as cognitive deficits. CIR recommended by Rehab team and patient admitted to CIR to help with progression and decrease burden of care.    Pt oriented to person and place, not time Difficulty following 2 step commands Dysarthric  Review of Systems  Eyes: Negative for blurred vision and double vision.  Respiratory: Positive for cough. Negative for shortness of breath.   Cardiovascular: Negative for chest pain and palpitations.  Gastrointestinal: Positive for constipation (No bm yet).  Genitourinary:        Foley still in place  Musculoskeletal: Positive for joint pain (chronic right knee pain).  Neurological: Positive for dizziness  (improving ), speech change and headaches (come and go).       Past Medical History   Diagnosis  Date   .  Hepatitis C     .  Pneumonia     .  Hypertension      Past Surgical History   Procedure  Laterality  Date   .  Neck surgery       .  Shoulder surgery  Bilateral     .  Craniectomy  N/A  08/18/2013       Procedure: CRANIECTOMY POSTERIOR FOSSA DECOMPRESSION, EVACUATION OF HEMORRHAGE;  Surgeon: Consuella Lose, MD;  Location: [facility];  Service: Neurosurgery;  Laterality: N/A;    Family History   Problem  Relation  Age of Onset   .  Diabetes  Father     .  Diabetes  Sister      Social History:  Currently alternates living with cousin and her elderly aunt ( who has recent surgery). Has 73 year old in high school.  Unemployed. She reports that she has been smoking Cigarettes--2 PPD. She has a 80 pack-year smoking history. She has never used smokeless tobacco. She reports that she drinks about 4-5 beers daily. Per  reports that she uses illicit drugs (Cocaine).         Allergies   Allergen  Reactions   .  Ibuprofen  Hives and Other (See Comments)       Burning of the skin.    Medications Prior to Admission   Medication  Sig  Dispense  Refill   .  albuterol (PROVENTIL HFA;VENTOLIN HFA) 108 (  90 BASE) MCG/ACT inhaler  Inhale 2 puffs into the lungs every 6 (six) hours as needed for wheezing.   3 Inhaler   3   .  FLUoxetine (PROZAC) 20 MG capsule  Take 1 capsule (20 mg total) by mouth daily.   30 capsule   3   .  hydrOXYzine (ATARAX/VISTARIL) 10 MG tablet  Take 1-2 tablets (10-20 mg total) by mouth every 8 (eight) hours as needed for itching.   45 tablet   0   .  metoprolol tartrate (LOPRESSOR) 25 MG tablet  Take 0.5 tablets (12.5 mg total) by mouth 2 (two) times daily.   60 tablet   5   .  nicotine (EQ NICOTINE) 14 mg/24hr patch  Place 1 patch (14 mg total) onto the skin daily.   28 patch   0   .  ofloxacin (OCUFLOX) 0.3 % ophthalmic solution  Place 1 drop into both eyes 4  (four) times daily.   10 mL   2   .  predniSONE (DELTASONE) 20 MG tablet  Take 2 tablets (40 mg total) by mouth daily with breakfast.   10 tablet   0   .  terbinafine (LAMISIL AT) 1 % cream  Apply 1 application topically 2 (two) times daily.   30 g   3   .  zolpidem (AMBIEN) 5 MG tablet  Take 5 mg by mouth at bedtime as needed for sleep.            Home: Home Living Family/patient expects to be discharged to:: Private residence Living Arrangements: Other relatives Available Help at Discharge: Family Type of Home: Apartment [place]) Home Access: Stairs to enter Technical brewer of Steps: 3 Home Layout: One level Home Equipment: None    Functional History: Prior Function Level of Independence: Independent   Functional Status:   Mobility: Bed Mobility Overal bed mobility: Needs Assistance Bed Mobility: Supine to Sit;Sit to Supine Supine to sit: Min guard (for safety) Sit to supine: Min assist (hand held assist to lay back in bed) General bed mobility comments: verbal cues to move slowly and (A) to lay back in supine without flinging trunk backwards. Transfers Overall transfer level: Needs assistance Equipment used: None Transfers: Sit to/from Stand Sit to Stand: Mod assist Stand pivot transfers: Mod assist General transfer comment: Verbal cues for pt to move slowly. Placed pt's hands on knees and assisted pt to power up halfway from seated position to activate upper thigh muscles. Pt engaged x3 sit<>stand without achieving full stand. Pt's bil knees were blocked to prevent buckling. Ambulation/Gait Ambulation/Gait assistance: Mod assist;+2 physical assistance;+2 safety/equipment Ambulation Distance (Feet): 60 Feet (x2) Assistive device: Rolling walker (2 wheeled) Gait Pattern/deviations: Step-through pattern;Ataxic;Narrow base of support;Trunk flexed;Decreased stance time - right;Decreased stride length General Gait Details: max v/c's for sequencing, max tactile cues  at posterior hips to provide appropriate cueing to achieve sequencing and provide support/stability   ADL: ADL Overall ADL's : Needs assistance/impaired Eating/Feeding: Minimal assistance;Sitting Eating/Feeding Details (indicate cue type and reason): cues for rate/bite size and to use L hand Grooming: Moderate assistance;Cueing for safety Upper Body Bathing: Moderate assistance;Sitting Lower Body Bathing: Moderate assistance;Cueing for safety Upper Body Dressing : Moderate assistance Lower Body Dressing: Maximal assistance Toilet Transfer: Moderate assistance;Stand-pivot Toileting- Clothing Manipulation and Hygiene: Maximal assistance Functional mobility during ADLs: +2 for physical assistance;Maximal assistance (for ambulation. Mod A with stand pivot transfers) General ADL Comments: Pt presents with ataxia, worse in RUE. Pt participated in activities to promote participation  in ADLs including reaching for objects across midline with resistance of theraband (orange) while sitting EOB. Pt also performed sit<>stand without achieving full stand to activate muscles in upper LEs.    Cognition: Cognition Overall Cognitive Status: Impaired/Different from baseline Orientation Level: Oriented to person;Oriented to place;Oriented to time Cognition Arousal/Alertness: Lethargic Behavior During Therapy: Impulsive Overall Cognitive Status: Impaired/Different from baseline Area of Impairment: Attention;Safety/judgement;Awareness;Problem solving Current Attention Level: Selective Safety/Judgement: Decreased awareness of safety;Decreased awareness of deficits Awareness: Emergent Problem Solving: Slow processing;Difficulty sequencing General Comments: unsure of baseline cognition. Pt has history of drug abuse   Physical Exam: Blood pressure 152/96, pulse 68, temperature 98.2 F (36.8 C), temperature source Oral, resp. rate 20, height 5\' 6"  (1.676 m), weight 84.097 kg (185 lb 6.4 oz), SpO2  97.00%. Physical Exam  Nursing note and vitals reviewed. Constitutional: She is oriented to person, place, and time. She appears well-developed and well-nourished.  HENT:   Head: Normocephalic and atraumatic.  adentulous  Eyes: Conjunctivae are normal. Pupils are equal, round, and reactive to light. Right eye exhibits no discharge and no exudate. Left eye exhibits no discharge and no exudate. Right conjunctiva is not injected. Right eye exhibits nystagmus. Left eye exhibits nystagmus.  Neck: Normal range of motion. Neck supple.  Cardiovascular: Normal rate and regular rhythm.   Respiratory: Effort normal and breath sounds normal.  GI: Soft. Bowel sounds are normal. She exhibits no distension. There is no tenderness.  Musculoskeletal: She exhibits no edema and no tenderness.  Neurological: She is alert and oriented to person, place, and time.  Tends to keeps eyes closed. Once engaged makes eye contact and interacts appropriately but impulsive and abrupt at times. She is  able to follow basic commands without difficulty.Difficulty with 2 step commands She continues to need  cues to slow down and enunciate due to moderate to severe dysarthria. Severe dysmetria bilateral finger nose finger, mod dymetria R HS,. Horizontal nystagmus right lateral field. Impulsive with poor attention and impaired insight  Skin: Skin is warm and dry. Rash (quater size patch on left shoulder. ) noted.   Motor is 5/5 in bilateral delt, bi, tri , grip, HF, KE, ADF Results for orders placed during the hospital encounter of 08/18/13 (from the past 48 hour(s))   GLUCOSE, CAPILLARY     Status: Abnormal     Collection Time      08/24/13 11:47 AM       Result  Value  Ref Range     Glucose-Capillary  144 (*)  70 - 99 mg/dL   GLUCOSE, CAPILLARY     Status: Abnormal     Collection Time      08/24/13  4:58 PM       Result  Value  Ref Range     Glucose-Capillary  149 (*)  70 - 99 mg/dL     Comment 1  Documented in Chart         Comment 2  Notify RN      GLUCOSE, CAPILLARY     Status: Abnormal     Collection Time      08/24/13  8:22 PM       Result  Value  Ref Range     Glucose-Capillary  162 (*)  70 - 99 mg/dL   GLUCOSE, CAPILLARY     Status: None     Collection Time      08/25/13 12:07 AM       Result  Value  Ref Range  Glucose-Capillary  94   70 - 99 mg/dL   GLUCOSE, CAPILLARY     Status: None     Collection Time      08/25/13  5:01 AM       Result  Value  Ref Range     Glucose-Capillary  98   70 - 99 mg/dL   GLUCOSE, CAPILLARY     Status: Abnormal     Collection Time      08/25/13  6:47 AM       Result  Value  Ref Range     Glucose-Capillary  114 (*)  70 - 99 mg/dL   GLUCOSE, CAPILLARY     Status: Abnormal     Collection Time      08/25/13  8:46 AM       Result  Value  Ref Range     Glucose-Capillary  117 (*)  70 - 99 mg/dL     Comment 1  Documented in Chart        Comment 2  Notify RN      GLUCOSE, CAPILLARY     Status: Abnormal     Collection Time      08/25/13 12:02 PM       Result  Value  Ref Range     Glucose-Capillary  191 (*)  70 - 99 mg/dL   GLUCOSE, CAPILLARY     Status: Abnormal     Collection Time      08/25/13  5:25 PM       Result  Value  Ref Range     Glucose-Capillary  162 (*)  70 - 99 mg/dL   GLUCOSE, CAPILLARY     Status: Abnormal     Collection Time      08/25/13  7:21 PM       Result  Value  Ref Range     Glucose-Capillary  146 (*)  70 - 99 mg/dL     Comment 1  Notify RN        Comment 2  Documented in Chart      GLUCOSE, CAPILLARY     Status: Abnormal     Collection Time      08/26/13 12:30 AM       Result  Value  Ref Range     Glucose-Capillary  115 (*)  70 - 99 mg/dL   GLUCOSE, CAPILLARY     Status: Abnormal     Collection Time      08/26/13  4:19 AM       Result  Value  Ref Range     Glucose-Capillary  117 (*)  70 - 99 mg/dL   GLUCOSE, CAPILLARY     Status: Abnormal     Collection Time      08/26/13  7:48 AM       Result  Value  Ref Range      Glucose-Capillary  100 (*)  70 - 99 mg/dL     Comment 1  Documented in Chart       No results found.         Medical Problem List and Plan: 1. Functional deficits secondary to Right paracentral cerebellar ICH 2.  DVT Prophylaxis/Anticoagulation: Mechanical: Antiembolism stockings, knee (TED hose) Bilateral lower extremities Sequential compression devices, below knee Bilateral lower extremities 3. Pain Management:  Will continue vicodin prn. 4. H/o depression/Mood:  Has been on prozac in the past. She is showing signs of adjustment disorder therefore will resume this.  LCSW to follow for evaluation and support.   5. Neuropsych: This patient is not capable of making decisions on her own behalf. 6. HTN: Will monitor with checks every 8 hours and titrate medications as indicated. Continue lopressor bid.   7. Hep C: Not a candidate for treatment due to ongoing drug abuse.   8. H/o gastritis: will add Pepcid. Poor po intake due to dislike of food.   9.  H/o Knee pain: Add voltaren gel if needed..   10. Abnormal LFTs: Will recheck in am.   11. Hypokelmia: Check follow up labs in am. Supplement if needed.   12. Thrombocytopenia: Will continue to monitor platelets as well as any signs of bleeding. Recheck in am and then twice a week. Current trend 104-->98-->99--->119.   13. Recent diffuse rash: Will wean off steroids as almost resolved.  Hyperglycemia should hopefully improve off steroids. Hgb A1C-5.0           Post Admission Physician Evaluation: Functional deficits secondary  to Right> Left paracentral cerebellar ICH  Patient is admitted to receive collaborative, interdisciplinary care between the physiatrist, rehab nursing staff, and therapy team. Patient's level of medical complexity and substantial therapy needs in context of that medical necessity cannot be provided at a lesser intensity of care such as a SNF. Patient has experienced substantial functional loss from his/her  baseline which was documented above under the "Functional History" and "Functional Status" headings.  Judging by the patient's diagnosis, physical exam, and functional history, the patient has potential for functional progress which will result in measurable gains while on inpatient rehab.  These gains will be of substantial and practical use upon discharge  in facilitating mobility and self-care at the household level. Physiatrist will provide 24 hour management of medical needs as well as oversight of the therapy plan/treatment and provide guidance as appropriate regarding the interaction of the two. 24 hour rehab nursing will assist with bladder management, bowel management, safety, skin/wound care, disease management, medication administration and patient education  and help integrate therapy concepts, techniques,education, etc. PT will assess and treat for/with: pre gait, gait training, endurance , safety, equipment, neuromuscular re education.   Goals are: Mod A mob. OT will assess and treat for/with: ADLs, Cognitive perceptual skills, Neuromuscular re education, safety, endurance, equipment.   Goals are: Mod A UE ADL, Max A LE ADL. SLP will assess and treat for/with: .  Goals are: safe adn adequate po intake, 75%  Speech intelligibiilty. Case Management and Social Worker will assess and treat for psychological issues and discharge planning. Team conference will be held weekly to assess progress toward goals and to determine barriers to discharge. Patient will receive at least 3 hours of therapy per day at least 5 days per week. ELOS: 15-20 days to get to an assist of 1 person        Prognosis:  fair    Charlett Blake M.D. [facility] FAAPM&R (Sports Med, Neuromuscular Med) Diplomate [place] Board of Electrodiagnostic Med  08/26/2013

## 2013-08-27 NOTE — Progress Notes (Signed)
[facility]     PROGRESS NOTE    Subjective/Complaints: Uneventful night. Denies any problems ROS limited due to cognition/behavior  Objective: Vital Signs: Blood pressure 128/97, pulse 70, temperature 97.6 F (36.4 C), temperature source Oral, resp. rate 18, height 5\' 6"  (1.676 m), weight 84 kg (185 lb 3 oz), SpO2 100.00%. No results found.  Recent Labs  08/27/13 0635  WBC 10.9*  HGB 13.2  HCT 38.7  PLT 154    Recent Labs  08/27/13 0635  NA 142  K 4.2  CL 105  GLUCOSE 91  BUN 13  CREATININE 0.69  CALCIUM 9.5   CBG (last 3)   Recent Labs  08/27/13 0009 08/27/13 0400 08/27/13 0748  GLUCAP 142* 82 109*    Wt Readings from Last 3 Encounters:  08/26/13 84 kg (185 lb 3 oz)  08/23/13 84.097 kg (185 lb 6.4 oz)  08/23/13 84.097 kg (185 lb 6.4 oz)    Physical Exam:   Constitutional: She is oriented to person, place, and time. She appears well-developed and well-nourished.  HENT:  Head: Normocephalic and atraumatic.  adentulous  Eyes: Conjunctivae are normal. Pupils are equal, round, and reactive to light. Right eye exhibits no discharge and no exudate. Left eye exhibits no discharge and no exudate. Right conjunctiva is not injected. Right eye exhibits nystagmus. Left eye exhibits nystagmus.  Neck: Normal range of motion. Neck supple.  Cardiovascular: Normal rate and regular rhythm.  Respiratory: Effort normal and breath sounds normal.  GI: Soft. Bowel sounds are normal. She exhibits no distension. There is no tenderness.  Musculoskeletal: She exhibits no edema and no tenderness.  Neurological: She is alert and oriented to person, place, and time.  Tends to keeps eyes closed. . She is able to follow basic commands without difficulty.Difficulty with 2 step commands She continues to need cues to slow down and enunciate due to moderate to severe dysarthria. Severe dysmetria bilateral finger nose finger, mod dymetria R HS,.  Horizontal nystagmus right lateral field. Impulsive with poor attention and impaired insight   Motor is 5/5 in bilateral delt, bi, tri , grip, HF, KE, ADF Skin: rash right shoulder Psych: flat. Keeps eyes closed  Assessment/Plan: 1. Functional deficits secondary to right paracentral cerebellar ICH which require 3+ hours per day of interdisciplinary therapy in a comprehensive inpatient rehab setting. Physiatrist is providing close team supervision and 24 hour management of active medical problems listed below. Physiatrist and rehab team continue to assess barriers to discharge/monitor patient progress toward functional and medical goals. FIM:                   Comprehension Comprehension Mode: Auditory Comprehension: 6-Follows complex conversation/direction: With extra time/assistive device  Expression Expression Mode: Verbal Expression: 5-Expresses basic needs/ideas: With extra time/assistive device  Social Interaction Social Interaction: 5-Interacts appropriately 90% of the time - Needs monitoring or encouragement for participation or interaction.  Problem Solving Problem Solving: 5-Solves complex 90% of the time/cues < 10% of the time  Memory Memory: 7-Complete Independence: No helper   Medical Problem List and Plan:  1. Functional deficits secondary to Right paracentral cerebellar ICH  2. DVT Prophylaxis/Anticoagulation: Mechanical: Antiembolism stockings, knee (TED hose) Bilateral lower extremities  Sequential compression devices, below knee Bilateral lower extremities  3. Pain Management: Will continue vicodin prn. 4. H/o depression/Mood: Has been on prozac in the past. She is showing signs of adjustment disorder therefore will resume this. LCSW to follow for evaluation and support.  5. Neuropsych: This patient is not capable of making decisions on her own behalf.  6. HTN: Will monitor with checks every 8 hours and titrate medications as indicated. Continue  lopressor bid.  7. Hep C: Not a candidate for treatment due to ongoing drug abuse.  8. H/o gastritis: will add Pepcid. Poor po intake due to dislike of food. Encourage intake 9. H/o Knee pain: Add voltaren gel if needed..  10. Abnormal LFTs: still elevated. ?hep c and stress related. Recheck next week 11. Hypokelmia:  Labs normal today  12. Thrombocytopenia: Will continue to monitor platelets as well as any signs of bleeding. Recheck in am and then twice a week. Current trend 104-->98-->99--->119--> 154  13. Recent diffuse rash: weaning off steroids as almost resolved.       LOS (Days) 1 A FACE TO FACE EVALUATION WAS PERFORMED  Meredith Staggers 08/27/2013 8:46 AM

## 2013-08-27 NOTE — Evaluation (Addendum)
Physical Therapy Assessment and Plan  Patient Details  Name: [name] MRN: [id number] Date of Birth: [birth year]  PT Diagnosis: Abnormal posture, Abnormality of gait, Ataxia, Ataxic gait, Cognitive deficits, Coordination disorder, Impaired cognition, Impaired sensation and Vertigo   Rehab Potential: Good ELOS: 10-12days   Today's Date: 08/27/2013 Time: 0800-0855 Time Calculation (min): 55 min  Problem List:  Patient Active Problem List   Diagnosis Date Noted  . ICH (intracerebral hemorrhage) 08/26/2013  . Cytotoxic cerebral edema 08/20/2013  . Brain herniation 08/20/2013  . Cerebellar hemorrhage, nontraumatic 08/18/2013  . Acute respiratory failure with hypoxia 08/18/2013  . Hepatitis C infection 08/03/2013  . Nausea with vomiting 08/03/2013  . Loss of weight 08/03/2013  . Arthritis 05/03/2013  . Influenza with respiratory manifestations 05/03/2013  . Acute hepatitis C virus infection without hepatic coma 05/03/2013    Past Medical History:  Past Medical History  Diagnosis Date  . Hepatitis C   . Pneumonia   . Hypertension   . Diabetes mellitus without complication    Past Surgical History:  Past Surgical History  Procedure Laterality Date  . Neck surgery    . Shoulder surgery Bilateral   . Craniectomy N/A 08/18/2013    Procedure: CRANIECTOMY POSTERIOR FOSSA DECOMPRESSION, EVACUATION OF HEMORRHAGE;  Surgeon: Consuella Lose, MD;  Location: [facility];  Service: Neurosurgery;  Laterality: N/A;    Assessment & Plan Clinical Impression: HPI: Greysen O Alsip is a 58 y.o. Left handed female with history of Hep C, HTN, tobacco abuse who was admitted on 08/18/13 with worst HA of her life and fall with question LOC. She was transported by EMS and has emesis with SBP 230 enroute. She was intubated in ED and CT head with large cerebellar bleed with significant mass effect and probable tonsillar herniation and near complete obliteration of 4 th ventricle. UDS  positive for ETOH and cocaine. She was started on mannitol as well as cardene drip and taken to OR emergently for suboccipital craniectomy and evacuation of cerebellar hematoma by Dr Kathyrn Sheriff. Extubated without difficulty on 05/21 but failed BSS. Patient with resultant dizzines, mild weakness left side, impaired balance, dysarthria and dysphagia. Neurology felt that patient had hemorrhage due to malignant hypertension as result of cocaine use and stroke workup underway. MBS done yesterday and patient started on Dysphaga 2, nectar liquids due to aspiration of thin. Patient with subsequent dizziness with HA, dysphagia, ataxic gait, impulsivity with poor safety as well as cognitive deficits. CIR recommended by Rehab team and patient admitted to CIR to help with progression and decrease burden of care. Patient transferred to CIR on 08/26/2013 .   Patient currently requires mod with mobility secondary to decreased cardiorespiratoy endurance, ataxia and decreased coordination, decreased attention, decreased awareness, decreased problem solving and decreased safety awareness, vestibular impairment and decreased sitting balance, decreased standing balance, decreased postural control and decreased balance strategies.  Prior to hospitalization, patient was independent  with mobility and lived with Family in a  (townhouse) home.  Home access is 3Stairs to enter.  Patient will benefit from skilled PT intervention to maximize safe functional mobility, minimize fall risk and decrease caregiver burden for planned discharge home with 24 hour supervision.  Anticipate patient will [facility] vs. SNF (if family unable to provide 24/7 supervision) at discharge.  PT - End of Session Activity Tolerance: Tolerates 30+ min activity with multiple rests Endurance Deficit: Yes PT Assessment Rehab Potential: Good Barriers to Discharge: Decreased caregiver support PT Patient demonstrates impairments in the following  area(s):  Balance;Endurance;Motor;Perception;Safety;Sensory PT Transfers Functional Problem(s): Bed Mobility;Bed to Chair;Car;Furniture;Floor PT Locomotion Functional Problem(s): Ambulation;Wheelchair Mobility;Stairs PT Plan PT Intensity: Minimum of 1-2 x/day ,45 to 90 minutes PT Frequency: 5 out of 7 days PT Duration Estimated Length of Stay: 10-12days PT Treatment/Interventions: Ambulation/gait training;Balance/vestibular training;Discharge planning;DME/adaptive equipment instruction;Functional mobility training;Disease management/prevention;Cognitive remediation/compensation;Patient/family education;Therapeutic Exercise;Visual/perceptual remediation/compensation;UE/LE Coordination activities;Therapeutic Activities;Pain management;Wheelchair propulsion/positioning;UE/LE Strength taining/ROM;Stair training;Psychosocial support;Neuromuscular re-education PT Transfers Anticipated Outcome(s): Supervision PT Locomotion Anticipated Outcome(s): Supervision PT Recommendation Recommendations for Other Services: Neuropsych consult;Vestibular eval Follow Up Recommendations: Skilled nursing facility;24 hour supervision/assistance Patient destination: [facility] (SNF) Equipment Recommended: To be determined;Rolling walker with 5" wheels  Skilled Therapeutic Intervention 1:1. Pt received supine in bed sleeping, easy to wake. PT evaluation performed, see detailed objective information below. Pt educated on rehab environment, goals for physical therapy and general safety plan. Pt very impulsive, ataxic and presenting with vestibular involvement req max cueing throughout session for safety to decrease speed during eating, toileting, transfer (squat pivot and SPT), w/c propulsion, ambulation w/ weighted RW and stair negotiation. Pt assisted back to bed at end of session w/ all needs in reach and bed alarm on.   PT Evaluation Precautions/Restrictions Precautions Precautions: Fall Precaution Comments:  impulsive, decreased spatial awareness Restrictions Weight Bearing Restrictions: No General Chart Reviewed: Yes Family/Caregiver Present: No Vital SignsTherapy Vitals Temp: 97.6 F (36.4 C) Temp src: Oral Pulse Rate: 70 Resp: 18 BP: 128/97 mmHg Patient Position (if appropriate): Lying Oxygen Therapy SpO2: 100 % O2 Device: None (Room air) Pain Pain Assessment Pain Assessment: No/denies pain Home Living/Prior Functioning Home Living Type of Home: Apartment Home Access: Stairs to enter Entrance Stairs-Number of Steps: 3 Entrance Stairs-Rails: Right Home Layout: One level  Lives With: Family Prior Function Level of Independence: Independent with homemaking with ambulation;Independent with transfers;Independent with gait;Independent with basic ADLs  Able to Take Stairs?: Yes Driving: Yes Vocation Requirements: "Everything prevented me from working" Comments: lives with a cousin Lashone Stauber pta and pt's 61 yo daughter, Vickii Penna Vision/Perception     Cognition Overall Cognitive Status: Impaired/Different from baseline Arousal/Alertness: Lethargic Orientation Level: Oriented to person;Oriented to situation;Oriented to place;Disoriented to time Attention: Sustained Sustained Attention: Impaired Sustained Attention Impairment: Functional basic;Verbal basic Selective Attention: Impaired Selective Attention Impairment: Functional basic;Verbal basic Memory Impairment: Decreased recall of new information;Decreased short term memory Decreased Short Term Memory: Verbal basic;Functional basic Awareness: Impaired Awareness Impairment: Intellectual impairment Problem Solving: Impaired Problem Solving Impairment: Verbal basic;Functional basic Executive Function: Decision Making;Self Monitoring;Self Correcting;Organizing Organizing: Impaired Organizing Impairment: Functional basic Decision Making: Impaired Decision Making Impairment: Verbal basic;Functional basic Self  Monitoring: Impaired Self Monitoring Impairment: Verbal basic;Functional basic Self Correcting: Impaired Self Correcting Impairment: Verbal basic;Functional basic Behaviors: Impulsive Safety/Judgment: Impaired Comments: requires constant cues for safety due to impulsivity Sensation Sensation Light Touch: Appears Intact Proprioception: Impaired by gross assessment Coordination Gross Motor Movements are Fluid and Coordinated: No Fine Motor Movements are Fluid and Coordinated: No Coordination and Movement Description: Grossly Ataxic Motor  Motor Motor: Ataxia Motor - Skilled Clinical Observations: Grossly ataxic, very impulsive  Mobility Bed Mobility Bed Mobility: Supine to Sit;Sit to Supine Supine to Sit: 5: Supervision;HOB elevated;With rails Supine to Sit Details: Verbal cues for precautions/safety Supine to Sit Details (indicate cue type and reason): cues for safety due to bed sheets, catheter Sit to Supine: 5: Supervision;HOB flat;With rail Sit to Supine - Details: Verbal cues for precautions/safety Sit to Supine - Details (indicate cue type and reason): cues for safety due to catherter Transfers Transfers: Yes Sit  to Stand: 4: Min assist;With armrests Sit to Stand Details: Verbal cues for precautions/safety;Verbal cues for safe use of DME/AE;Tactile cues for sequencing;Manual facilitation for weight shifting;Verbal cues for technique Stand to Sit: 4: Min assist Stand to Sit Details (indicate cue type and reason): Verbal cues for safe use of DME/AE;Verbal cues for precautions/safety;Verbal cues for technique;Tactile cues for sequencing;Manual facilitation for weight shifting Stand Pivot Transfers: 3: Mod assist Stand Pivot Transfer Details: Verbal cues for precautions/safety;Verbal cues for safe use of DME/AE;Manual facilitation for weight shifting;Tactile cues for sequencing;Verbal cues for technique Stand Pivot Transfer Details (indicate cue type and reason): cues for visual  targeting due to vestibular involvement, LOB in all directions Squat Pivot Transfers: 4: Min assist Squat Pivot Transfer Details: Verbal cues for precautions/safety;Verbal cues for safe use of DME/AE;Verbal cues for technique Locomotion  Ambulation Ambulation: Yes Ambulation/Gait Assistance: 3: Mod assist;2: Max assist Ambulation Distance (Feet): 75 Feet Assistive device: Rolling walker;Other (Comment) (weighted) Ambulation/Gait Assistance Details: Verbal cues for precautions/safety;Verbal cues for technique;Manual facilitation for weight shifting;Verbal cues for gait pattern;Verbal cues for safe use of DME/AE;Tactile cues for sequencing;Tactile cues for posture;Verbal cues for sequencing Ambulation/Gait Assistance Details: Without cueing pt initated use of verbal cues "1, 2, 1, 2..." to coordination reciprocal movement in B LE Gait Gait: Yes Gait Pattern: Impaired Gait Pattern: Ataxic;Wide base of support;Decreased hip/knee flexion - left;Decreased hip/knee flexion - right;Decreased step length - right;Decreased step length - left Gait velocity: Increased Stairs / Additional Locomotion Stairs: Yes Stairs Assistance: 4: Min assist Stairs Assistance Details: Verbal cues for technique;Verbal cues for precautions/safety;Verbal cues for sequencing Stair Management Technique: Two rails;Forwards;Step to pattern Wheelchair Mobility Wheelchair Mobility: Yes Wheelchair Assistance: 5: Supervision Wheelchair Assistance Details: Verbal cues for technique;Verbal cues for sequencing;Verbal cues for Information systems manager: Both upper extremities Wheelchair Parts Management: Needs assistance Distance: 200' and 150'  Trunk/Postural Assessment  Cervical Assessment Cervical Assessment: Within Functional Limits Thoracic Assessment Thoracic Assessment: Within Functional Limits Lumbar Assessment Lumbar Assessment: Within Functional Limits Postural Control Postural Control: Deficits on  evaluation Trunk Control: decreased in standing due to ataxia Righting Reactions: decreased in standing due to ataxia, impulsivity and vestibular involvements  Balance Balance Balance Assessed: Yes Static Sitting Balance Static Sitting - Balance Support: Bilateral upper extremity supported;Feet supported Static Sitting - Level of Assistance: 5: Stand by assistance Dynamic Sitting Balance Dynamic Sitting - Balance Support: Bilateral upper extremity supported;Left upper extremity supported;Right upper extremity supported;Feet supported Dynamic Sitting - Level of Assistance: 5: Stand by assistance;4: Min assist Dynamic Sitting - Balance Activities: Lateral lean/weight shifting;Forward lean/weight shifting;Reaching for Consulting civil engineer Standing - Balance Support: Bilateral upper extremity supported Static Standing - Level of Assistance: 4: Min assist Dynamic Standing Balance Dynamic Standing - Balance Support: Bilateral upper extremity supported Dynamic Standing - Level of Assistance: 3: Mod assist Dynamic Standing - Balance Activities: Forward lean/weight shifting;Lateral lean/weight shifting Extremity Assessment      RLE Assessment RLE Assessment: Within Functional Limits RLE Strength RLE Overall Strength Comments: Grossly 4+/5 LLE Assessment LLE Assessment: Within Functional Limits LLE Strength LLE Overall Strength Comments: Grossly 4+/5  FIM:  FIM - Control and instrumentation engineer Devices: Walker;Bed rails;Arm rests Bed/Chair Transfer: 5: Supine > Sit: Supervision (verbal cues/safety issues);5: Sit > Supine: Supervision (verbal cues/safety issues);3: Bed > Chair or W/C: Mod A (lift or lower assist);3: Chair or W/C > Bed: Mod A (lift or lower assist) FIM - Locomotion: Wheelchair Distance: 200' and 150' Locomotion: Wheelchair: 5: Travels 150 ft or more: maneuvers  on rugs and over door sills with supervision, cueing or coaxing FIM -  Locomotion: Ambulation Locomotion: Ambulation Assistive Devices: Walker - Rolling Ambulation/Gait Assistance: 3: Mod assist;2: Max assist Locomotion: Ambulation: 2: Travels 50 - 149 ft with maximal assistance (Pt: 25 - 49%) FIM - Locomotion: Stairs Locomotion: Stairs Assistive Devices: Hand rail - 2 Locomotion: Stairs: 2: Up and Down 4 - 11 stairs with minimal assistance (Pt.>75%)   Refer to Care Plan for Long Term Goals  Recommendations for other services: Neuropsych; Vestibular Eval  Discharge Criteria: Patient will be discharged from PT if patient refuses treatment 3 consecutive times without medical reason, if treatment goals not met, if there is a change in medical status, if patient makes no progress towards goals or if patient is discharged from hospital.  The above assessment, treatment plan, treatment alternatives and goals were discussed and mutually agreed upon: by patient  Gilmore Laroche 08/27/2013, 4:44 PM

## 2013-08-27 NOTE — Interval H&P Note (Signed)
[name] was admitted today to Inpatient Rehabilitation with the diagnosis of Right > L cerebellar ICH.  The patient's history has been reviewed, patient examined, and there is no change in status.  Patient continues to be appropriate for intensive inpatient rehabilitation.  I have reviewed the patient's chart and labs.  Questions were answered to the patient's satisfaction.  Luanna Salk Kirsteins 08/27/2013, 8:58 AM

## 2013-08-27 NOTE — Evaluation (Signed)
Occupational Therapy Assessment and Plan  Patient Details  Name: [name] MRN: [id number] Date of Birth: [birth year]  OT Diagnosis: ataxia, cognitive deficits, disturbance of vision and muscle weakness (generalized) Rehab Potential: Rehab Potential: Good ELOS: 10-12 days   Today's Date: 08/27/2013 Time: 1100-1140 Time Calculation (min): 40 min  Problem List:  Patient Active Problem List   Diagnosis Date Noted  . ICH (intracerebral hemorrhage) 08/26/2013  . Cytotoxic cerebral edema 08/20/2013  . Brain herniation 08/20/2013  . Cerebellar hemorrhage, nontraumatic 08/18/2013  . Acute respiratory failure with hypoxia 08/18/2013  . Hepatitis C infection 08/03/2013  . Nausea with vomiting 08/03/2013  . Loss of weight 08/03/2013  . Arthritis 05/03/2013  . Influenza with respiratory manifestations 05/03/2013  . Acute hepatitis C virus infection without hepatic coma 05/03/2013    Past Medical History:  Past Medical History  Diagnosis Date  . Hepatitis C   . Pneumonia   . Hypertension    Past Surgical History:  Past Surgical History  Procedure Laterality Date  . Neck surgery    . Shoulder surgery Bilateral   . Craniectomy N/A 08/18/2013    Procedure: CRANIECTOMY POSTERIOR FOSSA DECOMPRESSION, EVACUATION OF HEMORRHAGE;  Surgeon: Consuella Lose, MD;  Location: [facility];  Service: Neurosurgery;  Laterality: N/A;    Assessment & Plan Clinical Impression: Patient is a 58 y.o. year old female with recent admission to the hospital on 08/18/13 with worst HA of her life and fall with question LOC. She was transported by EMS and has emesis with SBP 230 enroute. She was intubated in ED and CT head with large cerebellar bleed with significant mass effect and probable tonsillar herniation and near complete obliteration of 4 th ventricle. UDS positive for ETOH and cocaine. She was started on mannitol as well as cardene drip and taken to OR emergently for suboccipital craniectomy  and evacuation of cerebellar hematoma by Dr Kathyrn Sheriff. Extubated without difficulty on 05/21 but failed BSS. Patient with resultant dizzines, mild weakness left side, impaired balance, dysarthria and dysphagia. Neurology felt that patient had hemorrhage due to malignant hypertension as result of cocaine use and stroke workup underway. Patient transferred to CIR on 08/26/2013 .    Patient currently requires min with basic self-care skills secondary to muscle weakness, impaired timing and sequencing, unbalanced muscle activation, ataxia, decreased coordination and decreased motor planning, decreased visual acuity, decreased visual perceptual skills and decreased visual motor skills, decreased awareness, decreased problem solving, decreased safety awareness and decreased memory and .  Prior to hospitalization, patient could complete B/IADL with independent .  Patient will benefit from skilled intervention to increase independence with basic self-care skills prior to discharge SNF until able to reach Mod I status.  Anticipate patient will require 24 hour supervision and follow-up OT will be determined..  OT - End of Session Activity Tolerance: Decreased this session Endurance Deficit: Yes OT Assessment Rehab Potential: Good Barriers to Discharge: Decreased caregiver support Barriers to Discharge Comments: Family is unable to provide 24/7 supervision. OT Patient demonstrates impairments in the following area(s): Balance;Behavior;Cognition;Endurance;Motor;Perception;Safety;Vision OT Basic ADL's Functional Problem(s): Eating;Grooming;Bathing;Dressing;Toileting OT Advanced ADL's Functional Problem(s): Simple Meal Preparation;Light Housekeeping OT Transfers Functional Problem(s): Toilet;Tub/Shower OT Additional Impairment(s): None OT Plan OT Intensity: Minimum of 1-2 x/day, 45 to 90 minutes OT Frequency: 5 out of 7 days OT Duration/Estimated Length of Stay: 10-12 days OT Treatment/Interventions:  Balance/vestibular training;Self Care/advanced ADL retraining;UE/LE Coordination activities;Visual/perceptual remediation/compensation;Cognitive remediation/compensation;Functional mobility training;Community reintegration;Therapeutic Activities;Discharge planning;Patient/family education;Therapeutic Exercise;UE/LE Strength taining/ROM;DME/adaptive equipment instruction OT Self Feeding Anticipated  Outcome(s): Mod I  OT Basic Self-Care Anticipated Outcome(s): Supervision OT Toileting Anticipated Outcome(s): Supervision OT Bathroom Transfers Anticipated Outcome(s): Supervision OT Recommendation Patient destination: [facility] (SNF) Follow Up Recommendations: 24 hour supervision/assistance;Skilled nursing facility Equipment Recommended: To be determined   Skilled Therapeutic Intervention Patient was seen for initial OT evaluation. Patient was right away very rude and defiant towards therapist. Patient did not want to do anything but sleep. Evaluation was cut short due to patient's refusal to participate in full 60 minute session. Patient's bathing and dressing was assessed at sink level. Patient showed signs of decreased motor coordination, depth perception, and ataxia. Increase vertigo was noticed during session. When patient attempted to transfer back to bed, she stood up quickly, did a 360 degree turn and tried to crawl into bed. Instead, patient face planted into bed coming very close to hitting head on bed rail. Therapist quickly provided education regarding safety awareness with her vertigo and ways to decrease the chance of falls. Patient verbalized understanding and was able to demonstrate proper functional transfer into bed with therapist cueing.   OT Evaluation Precautions/Restrictions  Precautions Precautions: Fall Precaution Comments: impulsive, decreased spatial awareness Restrictions Weight Bearing Restrictions: No General Amount of Missed OT Time (min): 20  Minutes Family/Caregiver Present: No Vital Signs   Pain Pain Assessment Pain Assessment: No/denies pain Home Living/Prior Functioning Home Living Available Help at Discharge: Family;Available PRN/intermittently Type of Home:  (townhouse) Home Access: Stairs to enter [employer] of Steps: 3 Entrance Stairs-Rails: Right Home Layout: One level Additional Comments: grab bars in shower and by toilet  Lives With: Family IADL History Homemaking Responsibilities: Yes Meal Prep Responsibility: Secondary Cleaning Responsibility: Secondary Current License: Yes Mode of Transportation: Car Occupation: Unemployed Prior Function Level of Independence: Independent with homemaking with ambulation;Independent with transfers;Independent with gait;Independent with basic ADLs  Able to Take Stairs?: Yes Driving: Yes Vocation: Unemployed Vocation Requirements: "Everything prevented me from working" Comments: lives with a cousin Colletta [place] [employer] pta and pt's 70 yo daughter, Vickii Penna ADL ADL Eating: Not assessed Grooming: Supervision/safety Where Assessed-Grooming: Sitting at sink Upper Body Bathing: Supervision/safety Where Assessed-Upper Body Bathing: Sitting at sink Lower Body Bathing: Supervision/safety;Minimal cueing Where Assessed-Lower Body Bathing: Sitting at sink Upper Body Dressing: Supervision/safety Where Assessed-Upper Body Dressing: Sitting at sink Lower Body Dressing: Minimal assistance Where Assessed-Lower Body Dressing: Sitting at sink;Standing at sink Toileting: Minimal assistance Where Assessed-Toileting: Glass blower/designer: Minimal assistance;Moderate verbal cueing Toilet Transfer Method: Stand pivot Tub/Shower Transfer: Not assessed Vision/Perception  Vision- History Baseline Vision/History: No visual deficits Patient Visual Report: Blurring of vision Vision- Assessment Depth Perception: Overshoots  Cognition Overall Cognitive Status:  Impaired/Different from baseline Arousal/Alertness: Awake/alert Orientation Level: Oriented to person (patient stated she did not know the date, place, or why she was here) Attention: Sustained;Selective Sustained Attention: Impaired Sustained Attention Impairment: Functional basic Selective Attention: Impaired Selective Attention Impairment: Functional basic Memory: Impaired Awareness: Impaired Awareness Impairment: Emergent impairment Problem Solving: Impaired Behaviors: Impulsive Safety/Judgment: Impaired Comments: Requires consistent cueing to decrease speed of mobility, eating etc.  Sensation Sensation Light Touch: Appears Intact Hot/Cold: Appears Intact Proprioception: Impaired by gross assessment Coordination Gross Motor Movements are Fluid and Coordinated: No Fine Motor Movements are Fluid and Coordinated: No Coordination and Movement Description: Grossly Ataxic Motor  Motor Motor: Ataxia Motor - Skilled Clinical Observations: Grossly ataxic, very impulsive Mobility  Bed Mobility Bed Mobility: Supine to Sit;Sit to Supine Supine to Sit: 5: Supervision;HOB elevated;With rails Supine to Sit Details: Verbal cues for precautions/safety Supine to  Sit Details (indicate cue type and reason): cues for safety due to bed sheets, catheter Sit to Supine: 5: Supervision;HOB flat;With rail Sit to Supine - Details: Verbal cues for precautions/safety Sit to Supine - Details (indicate cue type and reason): cues for safety due to catherter Transfers Sit to Stand: 4: Min assist;With armrests Sit to Stand Details: Verbal cues for precautions/safety;Verbal cues for safe use of DME/AE;Tactile cues for sequencing;Manual facilitation for weight shifting;Verbal cues for technique Stand to Sit: 4: Min assist Stand to Sit Details (indicate cue type and reason): Verbal cues for safe use of DME/AE;Verbal cues for precautions/safety;Verbal cues for technique;Tactile cues for sequencing;Manual  facilitation for weight shifting  Trunk/Postural Assessment  Cervical Assessment Cervical Assessment: Within Functional Limits Thoracic Assessment Thoracic Assessment: Within Functional Limits Lumbar Assessment Lumbar Assessment: Within Functional Limits Postural Control Postural Control: Deficits on evaluation Trunk Control: decreased in standing due to ataxia Righting Reactions: decreased in standing due to ataxia, impulsivity and vestibular involvements  Balance Balance Balance Assessed: Yes Static Sitting Balance Static Sitting - Balance Support: Bilateral upper extremity supported;Feet supported Static Sitting - Level of Assistance: 5: Stand by assistance Dynamic Sitting Balance Dynamic Sitting - Balance Support: Bilateral upper extremity supported;Left upper extremity supported;Right upper extremity supported;Feet supported Dynamic Sitting - Level of Assistance: 5: Stand by assistance;4: Min assist Dynamic Sitting - Balance Activities: Lateral lean/weight shifting;Forward lean/weight shifting;Reaching for Consulting civil engineer Standing - Balance Support: Bilateral upper extremity supported Static Standing - Level of Assistance: 4: Min assist Dynamic Standing Balance Dynamic Standing - Balance Support: Bilateral upper extremity supported Dynamic Standing - Level of Assistance: 3: Mod assist Dynamic Standing - Balance Activities: Forward lean/weight shifting;Lateral lean/weight shifting Extremity/Trunk Assessment RUE Assessment RUE Assessment: Within Functional Limits LUE Assessment LUE Assessment: Within Functional Limits  FIM:  FIM - Control and instrumentation engineer Devices: Walker;Bed rails;Arm rests Bed/Chair Transfer: 5: Supine > Sit: Supervision (verbal cues/safety issues);5: Sit > Supine: Supervision (verbal cues/safety issues);3: Bed > Chair or W/C: Mod A (lift or lower assist);3: Chair or W/C > Bed: Mod A (lift or lower assist)    Refer to Care Plan for Long Term Goals  Recommendations for other services: Neuropsych  Discharge Criteria: Patient will be discharged from OT if patient refuses treatment 3 consecutive times without medical reason, if treatment goals not met, if there is a change in medical status, if patient makes no progress towards goals or if patient is discharged from hospital.  The above assessment, treatment plan, treatment alternatives and goals were discussed and mutually agreed upon: by patient  Ailene Ravel, OTR/L,CBIS   08/27/2013, 12:26 PM

## 2013-08-28 ENCOUNTER — Inpatient Hospital Stay (HOSPITAL_COMMUNITY): Payer: No Typology Code available for payment source | Admitting: Physical Therapy

## 2013-08-28 ENCOUNTER — Inpatient Hospital Stay (HOSPITAL_COMMUNITY): Payer: No Typology Code available for payment source | Admitting: Speech Pathology

## 2013-08-28 ENCOUNTER — Encounter (HOSPITAL_COMMUNITY): Payer: No Typology Code available for payment source | Admitting: Occupational Therapy

## 2013-08-28 ENCOUNTER — Inpatient Hospital Stay (HOSPITAL_COMMUNITY): Payer: No Typology Code available for payment source | Admitting: Occupational Therapy

## 2013-08-28 DIAGNOSIS — I69993 Ataxia following unspecified cerebrovascular disease: Secondary | ICD-10-CM

## 2013-08-28 DIAGNOSIS — I619 Nontraumatic intracerebral hemorrhage, unspecified: Secondary | ICD-10-CM

## 2013-08-28 LAB — GLUCOSE, CAPILLARY
GLUCOSE-CAPILLARY: 82 mg/dL (ref 70–99)
GLUCOSE-CAPILLARY: 108 mg/dL — AB (ref 70–99)
GLUCOSE-CAPILLARY: 107 mg/dL — AB (ref 70–99)
Glucose-Capillary: 120 mg/dL — ABNORMAL HIGH (ref 70–99)
Glucose-Capillary: 124 mg/dL — ABNORMAL HIGH (ref 70–99)
Glucose-Capillary: 145 mg/dL — ABNORMAL HIGH (ref 70–99)
Glucose-Capillary: 210 mg/dL — ABNORMAL HIGH (ref 70–99)

## 2013-08-28 NOTE — Progress Notes (Signed)
Occupational Therapy Session Note  Patient Details  Name: [name] MRN: [id number] Date of Birth: 1955-09-29  Today's Date: 08/28/2013  Short Term Goals: Week 1:  OT Short Term Goal 1 (Week 1): Patient will increase bathing to supervision level. OT Short Term Goal 2 (Week 1): Patient will increase dressing to supervision level.  OT Short Term Goal 3 (Week 1): Patient will increase toileting to supervision. OT Short Term Goal 4 (Week 1): Patient will increase shower transfer to Supervision. OT Short Term Goal 5 (Week 1): Patient will increase fine and gross motor coordination to increase ability to perform daily tasks.   Skilled Therapeutic Interventions/Progress Updates:  [phone] - 10 Minutes Missed 50 Minutes secondary to patient unwilling/refused Individual Therapy No complaints of pain Patient received supine in bed with covers up to neck. As soon as therapist introduced self, patient stated "go away!". Therapist encouraged patient to participate. Therapist asked patient to open eyes and patient followed command, but closed eyes immediately after opening them. When asked orientation questions, patient with increased aggravation, yelling at therapist, "I"m at [place]!". Therapist continued to encourage patient to participate, but patient unwilling and with eyes shut. Patient stated "Damn, leave me alone!" and proceeded to pull covers over head/face. Therapist left all needs within reach and set bed alarm. RN notified of missed time and patient's behavior.   Precautions:  Precautions Precautions: Fall Precaution Comments: impulsive, decreased spatial awareness Restrictions Weight Bearing Restrictions: No  See FIM for current functional status  Estelle June 08/28/2013, 7:50 AM

## 2013-08-28 NOTE — Progress Notes (Signed)
Physical Therapy Session Note  Patient Details  Name: [name] MRN: [id number] Date of Birth: [birth year]  Today's Date: 08/28/2013 Time: 1113-1204 and 1345-1420 Time Calculation (min): 51 min and 35 min  Short Term Goals: Week 1:  PT Short Term Goal 1 (Week 1): Pt to perform bed mobility with supervision and cues <25% of time for safety PT Short Term Goal 2 (Week 1): Pt to perform SPT bed<>w/c with min A and without cues to utilize visual targeting PT Short Term Goal 3 (Week 1): Pt to propel w/c 250' w/ B UE and with cues <25% of time to utilize visual targeting PT Short Term Goal 4 (Week 1): Pt to maintain dynamic standing balance during functional tasks with LRAD and min A PT Short Term Goal 5 (Week 1): Pt to ambulate 150' w/ LRAD, min A and cues <25% of time to utilize visual targeting  Skilled Therapeutic Interventions/Progress Updates:    Treatment Session 1: Pt received semi-reclined in bed; asleep and difficult to arouse. Required max encouragement to participate in PT. Oriented x3 to self, place, and situation. Stated "Thursday, May 15th" for date; required min cueing to use visual aid to orient self to date. Baseline dizziness: 3/10. Stand pivot transfer from bed<>w/c with weighted rolling walker with mod A, max verbal cueing for safety awareness, hand placement, and technique with ineffective return demonstration. Performed w/c mobility 2x150' (to/from gym) with bilat UE's requiring close supervision to min A for obstacle negotiation. See below for detailed description of NMR performed in gym. Session ended in pt room, where pt was left semi-reclined in bed with 3 bed rails up, bed alarm on, and all needs within reach.  Treatment Session 2: Pt received semi-reclined in bed; more amenable to therapy as compared with AM session. Cousin present. Baseline dizziness: 3/10. This PT placed multiple sheets of paper containing the letter "A" on walls of room, on rolling walker for visual  targeting to mitigate dizziness with mobility. Pt  participated by expressing where she believed to be the most helpful locations for visual targets. Therapist again instructed pt to attempt to visually focus on nearest letter "A" until dizziness lowers to 3/10 prior to progressing to next step of functional transfer/mobility. Pt verbalized understanding.  Supine>sit with HOB flat, no rails requiring supervision. Upon sitting EOB, pt performed visual targeting with min cueing; however, pt perseverating on number "3" despite multidirectional postural sway. Stand pivot transfer from bed>w/c with mod A using weighted rolling walker, improved safety awareness (as compared with AM session); verbal cueing for safe hand placement with effective return demonstration. Per pt request, performed stand pivot transfer from w/c<>toilet with grab bar and mod A. During hand washing in standing (with min-mod A for stability/balance), pt utilized visual target without cueing. Performed static standing trials x2 (30-45 seconds per trial) with bilat UE support at sink during brief change. Pt with improved safety awareness, as exhibited by notifying therapist prior to sitting in w/c. Squat pivot from w/c>bed with min A, tactile cueing for hand placement with effective return demonstration. Sit>supine with HOB flat, no rails, supervision. Session ended in pt room, where pt was left semi-reclined in bed with 3 bed rails up, bed alarm on, cousin present and all needs within reach.  Therapy Documentation Precautions:  Precautions Precautions: Fall Precaution Comments: impulsive, decreased spatial awareness Restrictions Weight Bearing Restrictions: No General: AM Session: Amount of Missed PT Time (min): 9 Minutes Missed Time Reason: Other (comment) (PT late; finishing with previous pt)  PM session: Amount of Missed PT Time (min): 10 Minutes Missed Time Reason: Other (comment) (Scheduling error) Pain: Pain Assessment Pain  Assessment: No/denies pain Locomotion : Wheelchair Mobility Distance: 2x150'  NMR: AM session: Instructed pt to visually focus on letter "A" until dizziness at baseline (3/10) prior to progressing to next step of functional transfer/mobility. Pt verbalized understanding. Performed multiple sit<>stand transfers with min A and R HHA with concurrent visual targeting. Transitioned to static standing (multiple trials x15-45 seconds each) with min-mod A, R HHA for stability/balance. Pt with increased impulsivity, attempting to take steps despite significant postural sway. Max cueing provided to increase awareness of dizziness, and to utilize visual targeting as explained. Pt reported improvement in symptoms with visual targeting.  See FIM for current functional status  Therapy/Group: Individual Therapy  Benjie Karvonen A Hobble 08/28/2013, 1:30 PM

## 2013-08-28 NOTE — Progress Notes (Signed)
[place] PHYSICAL MEDICINE & REHABILITATION     PROGRESS NOTE    Subjective/Complaints: Ate 50% of breakfast No c/os Keeps eyes closed but listens to conversation Refused PT this am ROS limited due to cognition/behavior  Objective: Vital Signs: Blood pressure 151/93, pulse 58, temperature 98.1 F (36.7 C), temperature source Oral, resp. rate 17, height 5\' 6"  (1.676 m), weight 84 kg (185 lb 3 oz), SpO2 99.00%. No results found.  Recent Labs  08/27/13 0635  WBC 10.9*  HGB 13.2  HCT 38.7  PLT 154    Recent Labs  08/27/13 0635  NA 142  K 4.2  CL 105  GLUCOSE 91  BUN 13  CREATININE 0.69  CALCIUM 9.5   CBG (last 3)   Recent Labs  08/27/13 2359 08/28/13 0400 08/28/13 0806  GLUCAP 110* 82 108*    Wt Readings from Last 3 Encounters:  08/26/13 84 kg (185 lb 3 oz)  08/23/13 84.097 kg (185 lb 6.4 oz)  08/23/13 84.097 kg (185 lb 6.4 oz)    Physical Exam:   Constitutional: She is oriented to person, place, and time. She appears well-developed and well-nourished.  HENT:  Head: Normocephalic and atraumatic.  adentulous  Eyes: Conjunctivae are normal. Pupils are equal, round, and reactive to light. Right eye exhibits no discharge and no exudate. Left eye exhibits no discharge and no exudate. Right conjunctiva is not injected. Right eye exhibits nystagmus. Left eye exhibits nystagmus.  Neck: Normal range of motion. Neck supple.  Cardiovascular: Normal rate and regular rhythm.  Respiratory: Effort normal and breath sounds normal.  GI: Soft. Bowel sounds are normal. She exhibits no distension. There is no tenderness.  Musculoskeletal: She exhibits no edema and no tenderness.  Neurological: She is alert and oriented to person, place, and time.  Tends to keeps eyes closed. . She is able to follow basic commands without difficulty.Difficulty with 2 step commands She continues to need cues to slow down and enunciate due to moderate to severe dysarthria. Severe dysmetria  bilateral finger nose finger, mod dymetria R HS,. Horizontal nystagmus right lateral field. Impulsive with poor attention and impaired insight   Motor is 5/5 in bilateral delt, bi, tri , grip, HF, KE, ADF Skin: rash right shoulder Psych: flat. Keeps eyes closed  Assessment/Plan: 1. Functional deficits secondary to right paracentral cerebellar ICH which require 3+ hours per day of interdisciplinary therapy in a comprehensive inpatient rehab setting. Physiatrist is providing close team supervision and 24 hour management of active medical problems listed below. Physiatrist and rehab team continue to assess barriers to discharge/monitor patient progress toward functional and medical goals. FIM:             FIM - Control and instrumentation engineer Devices: Walker;Bed rails;Arm rests Bed/Chair Transfer: 5: Supine > Sit: Supervision (verbal cues/safety issues);5: Sit > Supine: Supervision (verbal cues/safety issues);3: Bed > Chair or W/C: Mod A (lift or lower assist);3: Chair or W/C > Bed: Mod A (lift or lower assist)  FIM - Locomotion: Wheelchair Distance: 200' and 150' Locomotion: Wheelchair: 5: Travels 150 ft or more: maneuvers on rugs and over door sills with supervision, cueing or coaxing FIM - Locomotion: Ambulation Locomotion: Ambulation Assistive Devices: Administrator Ambulation/Gait Assistance: 3: Mod assist;2: Max assist Locomotion: Ambulation: 2: Travels 50 - 149 ft with maximal assistance (Pt: 25 - 49%)  Comprehension Comprehension Mode: Auditory Comprehension: 5-Understands basic 90% of the time/requires cueing < 10% of the time  Expression Expression Mode: Verbal Expression: 2-Expresses basic  25 - 49% of the time/requires cueing 50 - 75% of the time. Uses single words/gestures.  Social Interaction Social Interaction: 2-Interacts appropriately 25 - 49% of time - Needs frequent redirection.  Problem Solving Problem Solving: 2-Solves basic 25 - 49% of  the time - needs direction more than half the time to initiate, plan or complete simple activities  Memory Memory: 4-Recognizes or recalls 75 - 89% of the time/requires cueing 10 - 24% of the time   Medical Problem List and Plan:  1. Functional deficits secondary to Right paracentral cerebellar ICH  2. DVT Prophylaxis/Anticoagulation: Mechanical: Antiembolism stockings, knee (TED hose) Bilateral lower extremities  Sequential compression devices, below knee Bilateral lower extremities  3. Pain Management: Will continue vicodin prn. 4. H/o depression/Mood: Has been on prozac in the past. She is showing signs of adjustment disorder therefore will resume this. LCSW to follow for evaluation and support.  5. Neuropsych: This patient is not capable of making decisions on her own behalf.  6. HTN: Will monitor with checks every 8 hours and titrate medications as indicated. Continue lopressor bid.  7. Hep C: Not a candidate for treatment due to ongoing drug abuse.  8. H/o gastritis: will add Pepcid. Poor po intake due to dislike of food. Encourage intake 9. H/o Knee pain: Add voltaren gel if needed..  10. Abnormal LFTs: still elevated. ?hep c and stress related. Recheck next week 11. Hypokelmia:  Labs normal today  12. Thrombocytopenia: Will continue to monitor platelets as well as any signs of bleeding. Recheck in am and then twice a week. Current trend 104-->98-->99--->119--> 154  13. Recent diffuse rash: weaning off steroids as almost resolved.       LOS (Days) 2 A FACE TO FACE EVALUATION WAS PERFORMED  Charlett Blake 08/28/2013 10:07 AM

## 2013-08-28 NOTE — Progress Notes (Signed)
Speech Language Pathology Daily Session Note  Patient Details  Name: [name] MRN: [id number] Date of Birth: 27-Mar-1956  Today's Date: 08/28/2013 Time: 1430-1500 Time Calculation (min): 30 min  Short Term Goals: Week 1: SLP Short Term Goal 1 (Week 1): Pt will consume current diet with Mod A verbal cues to minimize overt s/s of aspiration.  SLP Short Term Goal 2 (Week 1): Pt will utilize a slow rate of speech to increase speech intelligibility with Min multimodal cues.  SLP Short Term Goal 3 (Week 1): Pt will demonstrate sustained attention to a functional task for 15 minutes with Mod A verbal cues for redirection. SLP Short Term Goal 4 (Week 1): Pt will identify 2 cognitive impairments with Mod multimodal cues.  SLP Short Term Goal 5 (Week 1): Pt will utilize call bell to request assistance with Min multimodal cues.  SLP Short Term Goal 6 (Week 1): Pt will demonstrate functional problem solving for basic and familiar tasks with Mod A multimodal cues.   Skilled Therapeutic Interventions: Skilled therapeutic intervention focused on addressing speech goals, specifically dysarthria. Patient was alert but fatigued and required frequent verbal cues to engage. After initially moderate cues by SLP to slow down speech rate, pause between words and articulate more clearly, patient improved to only requiring minimal cues to utilize speech strategies. Intelligibilty at 3-5 word phrase level improved from 30% to 85% when she utilized speech strategies. She continued to communicate in short phrases, and sometimes just single words, and self-cued 3 times during session. As session progressed, patient appeared to become more fatigued and responded less frequently, with speech intelligiblity declining. Patient stated that she was tired from earlier physical therapy session. Continue with plan of care.   FIM:  Comprehension Comprehension Mode: Auditory Comprehension: 5-Understands basic 90% of the  time/requires cueing < 10% of the time Expression Expression Mode: Verbal Expression: 3-Expresses basic 50 - 74% of the time/requires cueing 25 - 50% of the time. Needs to repeat parts of sentences. Social Interaction Social Interaction: 2-Interacts appropriately 25 - 49% of time - Needs frequent redirection. Problem Solving Problem Solving: 2-Solves basic 25 - 49% of the time - needs direction more than half the time to initiate, plan or complete simple activities Memory Memory: 4-Recognizes or recalls 75 - 89% of the time/requires cueing 10 - 24% of the time FIM - Eating Eating Activity: 5: Needs verbal cues/supervision  Pain Pain Assessment Pain Assessment: 0-10 Pain Score: 1  Pain Type: Acute pain Pain Descriptors / Indicators: Headache Pain Intervention(s):  (patient stated that she had already received medication and that the headache was "now gone")  Therapy/Group: Individual Therapy  Dannial Monarch 08/28/2013, 4:39 PM  Sonia Baller, MA, CCC-SLP [facility] Speech-Language Pathologist

## 2013-08-29 ENCOUNTER — Inpatient Hospital Stay (HOSPITAL_COMMUNITY): Payer: Medicaid Other | Admitting: Physical Therapy

## 2013-08-29 LAB — GLUCOSE, CAPILLARY
GLUCOSE-CAPILLARY: 93 mg/dL (ref 70–99)
Glucose-Capillary: 101 mg/dL — ABNORMAL HIGH (ref 70–99)
Glucose-Capillary: 204 mg/dL — ABNORMAL HIGH (ref 70–99)
Glucose-Capillary: 149 mg/dL — ABNORMAL HIGH (ref 70–99)
Glucose-Capillary: 146 mg/dL — ABNORMAL HIGH (ref 70–99)

## 2013-08-29 LAB — URINE CULTURE: Colony Count: >=100000

## 2013-08-29 MED ORDER — STARCH (THICKENING) PO POWD
ORAL | Status: DC | PRN
  Filled 2013-08-29 (×2): qty 227

## 2013-08-29 NOTE — Progress Notes (Signed)
Physical Therapy Session Note  Patient Details  Name: [name] MRN: [id number] Date of Birth: 02/08/1956  Today's Date: 08/29/2013 Time:  0850-855  Short Term Goals: Week 1:  PT Short Term Goal 1 (Week 1): Pt to perform bed mobility with supervision and cues <25% of time for safety PT Short Term Goal 2 (Week 1): Pt to perform SPT bed<>w/c with min A and without cues to utilize visual targeting PT Short Term Goal 3 (Week 1): Pt to propel w/c 250' w/ B UE and with cues <25% of time to utilize visual targeting PT Short Term Goal 4 (Week 1): Pt to maintain dynamic standing balance during functional tasks with LRAD and min A PT Short Term Goal 5 (Week 1): Pt to ambulate 150' w/ LRAD, min A and cues <25% of time to utilize visual targeting  Skilled Therapeutic Interventions/Progress Updates:   Patient received sidelying facing away from door, covers over head. Patient initially waving from under covers with therapist introduction. Patient refusing to participate with max coaxing. Pt began coughing/spitting up and therapist raising [place] and attempting to assist patient, patient moving unsafely away from therapist and refusing assistance. Pt brought basin. Continually stating, "Go away, I'm staying right here. I'm not moving." NT arriving due to hearing patient coughing, also encouraging patient. Patient becoming increasingly irritated and yelling at therapist. Patient unwilling to participate, eyes closed. Patient left with all needs within reach, 3 rails up, and bed alarm on.   Therapy Documentation Precautions:  Precautions Precautions: Fall Precaution Comments: impulsive, decreased spatial awareness Restrictions Weight Bearing Restrictions: No General: Amount of Missed PT Time (min): 45 Minutes Missed Time Reason: Patient unwilling/refused to participate without medical reason   Therapy/Group: Individual Therapy  Laretta Alstrom 08/29/2013, 9:19 AM

## 2013-08-29 NOTE — Progress Notes (Signed)
[place] PHYSICAL MEDICINE & REHABILITATION     PROGRESS NOTE    Subjective/Complaints: "can I go home?" ROS limited due to cognition/behavior  Objective: Vital Signs: Blood pressure 111/75, pulse 59, temperature 98.1 F (36.7 C), temperature source Oral, resp. rate 18, height 5\' 6"  (1.676 m), weight 84 kg (185 lb 3 oz), SpO2 96.00%. No results found.  Recent Labs  08/27/13 0635  WBC 10.9*  HGB 13.2  HCT 38.7  PLT 154    Recent Labs  08/27/13 0635  NA 142  K 4.2  CL 105  GLUCOSE 91  BUN 13  CREATININE 0.69  CALCIUM 9.5   CBG (last 3)   Recent Labs  08/28/13 2357 08/29/13 0357 08/29/13 0730  GLUCAP 120* 101* 93    Wt Readings from Last 3 Encounters:  08/26/13 84 kg (185 lb 3 oz)  08/23/13 84.097 kg (185 lb 6.4 oz)  08/23/13 84.097 kg (185 lb 6.4 oz)    Physical Exam:   Constitutional: She is oriented to person, place, and time. She appears well-developed and well-nourished.  HENT:  Head: Normocephalic and atraumatic.  adentulous  Eyes: Conjunctivae are normal. Pupils are equal, round, and reactive to light. Right eye exhibits no discharge and no exudate. Left eye exhibits no discharge and no exudate. Right conjunctiva is not injected. Right eye exhibits nystagmus. Left eye exhibits nystagmus.  Neck: Normal range of motion. Neck supple.  Cardiovascular: Normal rate and regular rhythm.  Respiratory: Effort normal and breath sounds normal.  GI: Soft. Bowel sounds are normal. She exhibits no distension. There is no tenderness.  Musculoskeletal: She exhibits no edema and no tenderness.  Neurological: She is alert and oriented to person, place, and time.  Tends to keeps eyes closed. . She is able to follow basic commands without difficulty.Difficulty with 2 step commands She continues to need cues to slow down and enunciate due to moderate to severe dysarthria. Severe dysmetria bilateral finger nose finger, mod dymetria R HS,. Horizontal nystagmus right  lateral field. Impulsive with poor attention and impaired insight   Motor is 5/5 in bilateral delt, bi, tri , grip, HF, KE, ADF Skin: rash right shoulder Psych: flat. Keeps eyes closed  Assessment/Plan: 1. Functional deficits secondary to right paracentral cerebellar ICH which require 3+ hours per day of interdisciplinary therapy in a comprehensive inpatient rehab setting. Physiatrist is providing close team supervision and 24 hour management of active medical problems listed below. Physiatrist and rehab team continue to assess barriers to discharge/monitor patient progress toward functional and medical goals. FIM: FIM - Bathing Bathing Steps Patient Completed:  (refused)        FIM - Radio producer Devices: Grab bars Toilet Transfers: 3-From toilet/BSC: Mod A (lift or lower assist);3-To toilet/BSC: Mod A (lift or lower assist)  FIM - Engineer, site Assistive Devices: Arm rests Bed/Chair Transfer: 5: Supine > Sit: Supervision (verbal cues/safety issues);5: Sit > Supine: Supervision (verbal cues/safety issues);4: Chair or W/C > Bed: Min A (steadying Pt. > 75%);3: Bed > Chair or W/C: Mod A (lift or lower assist)  FIM - Locomotion: Wheelchair Distance: 2x150' Locomotion: Wheelchair: 0: Activity did not occur FIM - Locomotion: Ambulation Locomotion: Ambulation Assistive Devices: Administrator Ambulation/Gait Assistance: 3: Mod assist;2: Max assist Locomotion: Ambulation: 0: Activity did not occur  Comprehension Comprehension Mode: Auditory Comprehension: 5-Understands complex 90% of the time/Cues < 10% of the time  Expression Expression Mode: Verbal Expression: 2-Expresses basic 25 - 49% of the time/requires  cueing 50 - 75% of the time. Uses single words/gestures.  Social Interaction Social Interaction: 2-Interacts appropriately 25 - 49% of time - Needs frequent redirection.  Problem Solving Problem Solving: 2-Solves  basic 25 - 49% of the time - needs direction more than half the time to initiate, plan or complete simple activities  Memory Memory: 4-Recognizes or recalls 75 - 89% of the time/requires cueing 10 - 24% of the time   Medical Problem List and Plan:  1. Functional deficits secondary to Right paracentral cerebellar ICH  2. DVT Prophylaxis/Anticoagulation: Mechanical: Antiembolism stockings, knee (TED hose) Bilateral lower extremities  Sequential compression devices, below knee Bilateral lower extremities  3. Pain Management: Will continue vicodin prn. 4. H/o depression/Mood: Has been on prozac in the past. She is showing signs of adjustment disorder therefore will resume this. LCSW to follow for evaluation and support.  5. Neuropsych: This patient is not capable of making decisions on her own behalf.  6. HTN: Will monitor with checks every 8 hours and titrate medications as indicated. Continue lopressor bid.  7. Hep C: Not a candidate for treatment due to ongoing drug abuse.  8. H/o gastritis: will add Pepcid. Poor po intake due to dislike of food. Encourage intake 9. H/o Knee pain: Add voltaren gel if needed..  10. Abnormal LFTs: still elevated. ?hep c and stress related. Recheck next week 11. Hypokelmia:  Labs normal today  12. Thrombocytopenia: Will continue to monitor platelets as well as any signs of bleeding. Recheck in am and then twice a week. Current trend 104-->98-->99--->119--> 154  13. Recent diffuse rash: weaning off steroids as almost resolved.       LOS (Days) 3 A FACE TO FACE EVALUATION WAS PERFORMED  Charlett Blake 08/29/2013 8:10 AM

## 2013-08-30 ENCOUNTER — Inpatient Hospital Stay (HOSPITAL_COMMUNITY): Payer: No Typology Code available for payment source

## 2013-08-30 ENCOUNTER — Encounter (HOSPITAL_COMMUNITY): Payer: No Typology Code available for payment source

## 2013-08-30 ENCOUNTER — Inpatient Hospital Stay (HOSPITAL_COMMUNITY): Payer: Medicaid Other | Admitting: Speech Pathology

## 2013-08-30 DIAGNOSIS — I619 Nontraumatic intracerebral hemorrhage, unspecified: Secondary | ICD-10-CM

## 2013-08-30 DIAGNOSIS — I69993 Ataxia following unspecified cerebrovascular disease: Secondary | ICD-10-CM

## 2013-08-30 LAB — GLUCOSE, CAPILLARY
GLUCOSE-CAPILLARY: 104 mg/dL — AB (ref 70–99)
GLUCOSE-CAPILLARY: 139 mg/dL — AB (ref 70–99)
GLUCOSE-CAPILLARY: 160 mg/dL — AB (ref 70–99)
Glucose-Capillary: 98 mg/dL (ref 70–99)
Glucose-Capillary: 100 mg/dL — ABNORMAL HIGH (ref 70–99)
Glucose-Capillary: 105 mg/dL — ABNORMAL HIGH (ref 70–99)
Glucose-Capillary: 169 mg/dL — ABNORMAL HIGH (ref 70–99)

## 2013-08-30 MED ORDER — FLUOXETINE HCL 20 MG PO CAPS
20.0000 mg | ORAL_CAPSULE | Freq: Every day | ORAL | Status: DC
  Administered 2013-08-31 – 2013-09-10 (×11): 20 mg via ORAL
  Filled 2013-08-30 (×12): qty 1

## 2013-08-30 NOTE — Progress Notes (Signed)
Physical Therapy Session Note  Patient Details  Name: [name] MRN: [id number] Date of Birth: 06/18/1955  Today's Date: 08/30/2013 Time: 0910-1000 Time Calculation (min): 50 min  Short Term Goals: Week 1:  PT Short Term Goal 1 (Week 1): Pt to perform bed mobility with supervision and cues <25% of time for safety PT Short Term Goal 2 (Week 1): Pt to perform SPT bed<>w/c with min A and without cues to utilize visual targeting PT Short Term Goal 3 (Week 1): Pt to propel w/c 250' w/ B UE and with cues <25% of time to utilize visual targeting PT Short Term Goal 4 (Week 1): Pt to maintain dynamic standing balance during functional tasks with LRAD and min A PT Short Term Goal 5 (Week 1): Pt to ambulate 150' w/ LRAD, min A and cues <25% of time to utilize visual targeting  Skilled Therapeutic Interventions/Progress Updates:  1:1. Pt received supine in bed, req mod cues to wake and orient to tx session. Pt missed 64min at start of session due to nursing needs. Pt req min guard A for t/f sup>sit EOB and min A for squat pivot t/f bed>w/c due to impulsivity. Focus this session on visual targeting during transfers and mobility as well as core stabilization exercises. Pt propelled w/c 150'x2 w/ B UE and overall supervision w/ mod cueing for safety room<>therapy gym. Strong emphasis on visual targeting this session due to vestibular involvement, pt frequently closing eyes or only keeping one open. Pt practiced ambulation in straight line 75'x3 and SPT chair<>w/c w/ RW and min-mod A. Pt req consistent cueing to attend to "A" placed on frame of w/c and able amb w/ min A when using targeting technique, but mod A when looking at distractions in environment. Pt also req consistent cueing for hand placement during transfers for safety. Gait characterized by ataxia, dysmetric step lengths (R>L) and impulsivity. Pt performed seated PNF patterns w/ B UE and weighted yellow ball to target core stabilization. Pt  sitting in w/c at nurses station at end of session w/ quick release belt in [email or web address]   Therapy Documentation Precautions:  Precautions Precautions: Fall Precaution Comments: impulsive, decreased spatial awareness Restrictions Weight Bearing Restrictions: No  See FIM for current functional status  Therapy/Group: Individual Therapy  Gilmore Laroche 08/30/2013, 12:22 PM

## 2013-08-30 NOTE — Progress Notes (Signed)
[place] PHYSICAL MEDICINE & REHABILITATION     PROGRESS NOTE    Subjective/Complaints: Didn't sleep well. Didn't offer much more. ROS limited due to cognition/behavior  Objective: Vital Signs: Blood pressure 125/82, pulse 62, temperature 97.4 F (36.3 C), temperature source Oral, resp. rate 20, height 5\' 6"  (1.676 m), weight 84 kg (185 lb 3 oz), SpO2 99.00%. No results found. No results found for this basename: WBC, HGB, HCT, PLT,  in the last 72 hours No results found for this basename: NA, K, CL, CO, GLUCOSE, BUN, CREATININE, CALCIUM,  in the last 72 hours CBG (last 3)   Recent Labs  08/30/13 0008 08/30/13 0532 08/30/13 0718  GLUCAP 104* 100* 105*    Wt Readings from Last 3 Encounters:  08/26/13 84 kg (185 lb 3 oz)  08/23/13 84.097 kg (185 lb 6.4 oz)  08/23/13 84.097 kg (185 lb 6.4 oz)    Physical Exam:   Constitutional: She is oriented to person, place, and time. She appears well-developed and well-nourished.  HENT:  Head: Normocephalic and atraumatic.  adentulous  Eyes: Conjunctivae are normal. Pupils are equal, round, and reactive to light. Right eye exhibits no discharge and no exudate. Left eye exhibits no discharge and no exudate. Right conjunctiva is not injected. Right eye exhibits nystagmus. Left eye exhibits nystagmus.  Neck: Normal range of motion. Neck supple.  Cardiovascular: Normal rate and regular rhythm.  Respiratory: Effort normal and breath sounds normal.  GI: Soft. Bowel sounds are normal. She exhibits no distension. There is no tenderness.   Musculoskeletal: She exhibits no edema and no tenderness.  Neurological: She is alert and oriented to person, place, and time.  Tends to keeps eyes closed. . She is able to follow basic commands without difficulty.Difficulty with 2 step commands She continues to display moderate to severe dysarthria. Severe dysmetria bilateral finger nose finger, mod dymetria R HS. Horizontal nystagmus right lateral field.  Impulsive with poor attention and impaired insight   Motor is 5/5 in bilateral delt, bi, tri , grip, HF, KE, ADF Skin: rash right shoulder Psych: flat. Keeps eyes closed  Assessment/Plan: 1. Functional deficits secondary to right paracentral cerebellar ICH which require 3+ hours per day of interdisciplinary therapy in a comprehensive inpatient rehab setting. Physiatrist is providing close team supervision and 24 hour management of active medical problems listed below. Physiatrist and rehab team continue to assess barriers to discharge/monitor patient progress toward functional and medical goals. FIM: FIM - Bathing Bathing Steps Patient Completed: Chest;Right Arm;Left Arm;Abdomen;Front perineal area;Buttocks;Left lower leg (including foot);Right lower leg (including foot);Left upper leg;Right upper leg Bathing: 4: Steadying assist  FIM - Upper Body Dressing/Undressing Upper body dressing/undressing: 0: Wears gown/pajamas-no public clothing FIM - Lower Body Dressing/Undressing Lower body dressing/undressing steps patient completed: Thread/unthread right underwear leg;Thread/unthread left underwear leg;Pull underwear up/down;Thread/unthread right pants leg;Thread/unthread left pants leg;Pull pants up/down;Don/Doff right sock;Don/Doff left sock  FIM - Toileting Toileting: 4: Steadying assist  FIM - Radio producer Devices: Grab bars Toilet Transfers: 3-From toilet/BSC: Mod A (lift or lower assist);3-To toilet/BSC: Mod A (lift or lower assist)  FIM - Bed/Chair Transfer Bed/Chair Transfer Assistive Devices: Bed rails;Arm rests Bed/Chair Transfer: 5: Supine > Sit: Supervision (verbal cues/safety issues);5: Sit > Supine: Supervision (verbal cues/safety issues);4: Bed > Chair or W/C: Min A (steadying Pt. > 75%);4: Chair or W/C > Bed: Min A (steadying Pt. > 75%)  FIM - Locomotion: Wheelchair Distance: 2x150' Locomotion: Wheelchair: 0: Activity did not occur FIM -  Locomotion: Ambulation  Locomotion: Ambulation Assistive Devices: Administrator Ambulation/Gait Assistance: 3: Mod assist;2: Max assist Locomotion: Ambulation: 0: Activity did not occur  Comprehension Comprehension Mode: Auditory Comprehension: 5-Understands basic 90% of the time/requires cueing < 10% of the time  Expression Expression Mode: Verbal Expression: 2-Expresses basic 25 - 49% of the time/requires cueing 50 - 75% of the time. Uses single words/gestures.  Social Interaction Social Interaction: 3-Interacts appropriately 50 - 74% of the time - May be physically or verbally inappropriate.  Problem Solving Problem Solving: 3-Solves basic 50 - 74% of the time/requires cueing 25 - 49% of the time  Memory Memory: 4-Recognizes or recalls 75 - 89% of the time/requires cueing 10 - 24% of the time   Medical Problem List and Plan:  1. Functional deficits secondary to Right paracentral cerebellar ICH  2. DVT Prophylaxis/Anticoagulation: Mechanical: Antiembolism stockings, knee (TED hose) Bilateral lower extremities  Sequential compression devices, below knee Bilateral lower extremities  3. Pain Management: Will continue vicodin prn. 4. H/o depression/Mood: prozac resumed  -probably a good idea to get neuropsych involved also  -pt flat, withdrawn, frequently refusing therapy and interactions as a whole.  5. Neuropsych: This patient is not capable of making decisions on her own behalf.  6. HTN: Will monitor with checks every 8 hours and titrate medications as indicated. Continue lopressor bid.  7. Hep C: Not a candidate for treatment due to ongoing drug abuse.  8. H/o gastritis: will add Pepcid. Poor po intake due to dislike of food. Encourage intake 9. H/o Knee pain: Add voltaren gel if needed..  10. Abnormal LFTs: still elevated. ?hep c and stress related. Recheck next week 11. Hypokelmia:  Labs normal  12. Thrombocytopenia: Will continue to monitor platelets as well as any signs  of bleeding. Trending up 13. Recent diffuse rash:   resolved.       LOS (Days) 4 A FACE TO FACE EVALUATION WAS PERFORMED  Meredith Staggers 08/30/2013 8:53 AM

## 2013-08-30 NOTE — Plan of Care (Signed)
Problem: RH BLADDER ELIMINATION Goal: RH STG MANAGE BLADDER WITH ASSISTANCE STG Manage Bladder With Min Assistance  Outcome: Progressing Voids a small amount still needs to be cath PVR are still above 350

## 2013-08-30 NOTE — Progress Notes (Signed)
Speech Language Pathology Daily Session Note  Patient Details  Name: [name] MRN: [id number] Date of Birth: 08-22-1955  Today's Date: 08/30/2013 Time: 1030-1055 Time Calculation (min): 25 min  Short Term Goals: Week 1: SLP Short Term Goal 1 (Week 1): Pt will consume current diet with Mod A verbal cues to minimize overt s/s of aspiration.  SLP Short Term Goal 2 (Week 1): Pt will utilize a slow rate of speech to increase speech intelligibility with Min multimodal cues.  SLP Short Term Goal 3 (Week 1): Pt will demonstrate sustained attention to a functional task for 15 minutes with Mod A verbal cues for redirection. SLP Short Term Goal 4 (Week 1): Pt will identify 2 cognitive impairments with Mod multimodal cues.  SLP Short Term Goal 5 (Week 1): Pt will utilize call bell to request assistance with Min multimodal cues.  SLP Short Term Goal 6 (Week 1): Pt will demonstrate functional problem solving for basic and familiar tasks with Mod A multimodal cues.   Skilled Therapeutic Interventions: Skilled treatment session focused on cognitive and speech goals. SLP facilitated session by providing Max A multimodal cues for utilization of a slow pace, pausing between words and over articulation at the word and phrase level to increase speech intelligibility to ~50 %.  Pt performed basic money management tasks with Min A verbal cues for problem solving but required Max A multimodal cues to decrease impulsivity. Pt also performed a 4 step sequencing task with picture cards with 100% accuracy with supervision verbal cues and utilized short, simple phrases for describing of pictures with ~50% intelligibility and max multimodal cues. Pt transferred back to bed with Max A verbal cues for safety with all needs within reach and bed alarm on. Continue with current plan of care.    FIM:  Comprehension Comprehension Mode: Auditory Comprehension: 5-Understands basic 90% of the time/requires cueing < 10% of  the time Expression Expression Mode: Verbal Expression: 2-Expresses basic 25 - 49% of the time/requires cueing 50 - 75% of the time. Uses single words/gestures. Social Interaction Social Interaction: 3-Interacts appropriately 50 - 74% of the time - May be physically or verbally inappropriate. Problem Solving Problem Solving: 3-Solves basic 50 - 74% of the time/requires cueing 25 - 49% of the time Memory Memory: 4-Recognizes or recalls 75 - 89% of the time/requires cueing 10 - 24% of the time  Pain Pain Assessment Pain Assessment: 0-10 Pain Score: 2   Therapy/Group: Individual Therapy  Buzzy Han 08/30/2013, 12:52 PM

## 2013-08-30 NOTE — Progress Notes (Addendum)
Occupational Therapy Session Note  Patient Details  Name: [name] MRN: [id number] Date of Birth: 1955-09-15  Today's Date: 08/30/2013  Session 1 Time: 0800-0840 Time Calculation (min): 40 min  Short Term Goals: Week 1:  OT Short Term Goal 1 (Week 1): Patient will increase bathing to supervision level. OT Short Term Goal 2 (Week 1): Patient will increase dressing to supervision level.  OT Short Term Goal 3 (Week 1): Patient will increase toileting to supervision. OT Short Term Goal 4 (Week 1): Patient will increase shower transfer to Supervision. OT Short Term Goal 5 (Week 1): Patient will increase fine and gross motor coordination to increase ability to perform daily tasks.   Skilled Therapeutic Interventions/Progress Updates:    Pt asleep in bed upon arrival but easily aroused.  Pt communicated that she wanted to eat before bathing.  Pt sat EOB to complete eating requiring max verbal cues to slow rate of feeding and portion control.  Pt required min A for all transfers to and from w/c.  Pt completed bathing tasks at shower level performing lateral leans to bathe buttocks.  Pt completed dressing tasks with steady A.  Pt required mod verbal cues to keep eyes open and control gross motor movements. Pt's conversation is very difficult to understand and patient becomes frustrated when asked to repeat.  Pt exhibits impulsivity with transfers, eating, and BADLs but does not respond positively to verbal cues to correct behaviors/movements.  Focus on activity tolerance, transfers, sit<>stand, standing balance, and safety awareness.  Therapy Documentation Precautions:  Precautions Precautions: Fall Precaution Comments: impulsive, decreased spatial awareness Restrictions Weight Bearing Restrictions: No General: General Amount of Missed OT Time (min): 20 Minutes Pt declined to continue after eating breakfast, bathing, and dressing Pain: Pain Assessment Pain Assessment: No/denies  pain  See FIM for current functional status  Therapy/Group: Individual Therapy  Session 2 Time: 6629-4765 Pt denied pain Individual Therapy (cotx with Recreational Therapist) Pt missed 10 mins therapy secondary to fatigue  Pt resting in bed upon arrival but initially agreeable to engaging in therapy.  Pt propelled w/c to ADL apartment with min verbal cues for navigation and to avoid running into objects.  Pt responded to questions regarding discharge and prior level of function with abrupt responses.  Pt engaged in functional amb with RW at min A level with verbal cues for navigation purposes.  Pt walk approx 50' and propelled w/c to room.  Pt walked approx another 25' to enter room and return to bed.  Pt reported slight dizziness but no nystagmus noted.  Pt declined keeping her eyes open to accurately assess nystagmus.  Pt was able to report purpose of letter A on RW (to focus) but did not incorporate strategy when reporting dizziness.  Pt's transitional movements and ambulation with RW are ataxic requiring min A for safety.  Focus on activity tolerance, functional ambulation with RW, discharge planning, and safety awareness.  Leroy Libman 08/30/2013, 8:47 AM

## 2013-08-30 NOTE — Progress Notes (Signed)
Recreational Therapy Session Note  Patient Details  Name: [name] MRN: [id number] Date of Birth: Aug 01, 1955 Today's Date: 08/30/2013  Pain: no c/o Skilled Therapeutic Interventions/Progress Updates: Attempted TR eval & Asked multiple questions about activity preferences PTA in which pt consistently responded, "sleeping"  Pt answered questions appropriately about diagnosis & identification of deficits noting speech is most impaired.  Pt propelled w/c on unit with BUEs with min assist for obstacle negotiation & safety.  Pt noted with impulsivity & ataxia during activity.  Pt placed on HOLD for TR services at this time.  Will continue to monitor through team for future TR participation.  Therapy/Group: Co-Treatment  Waldon Reining 08/30/2013, 3:53 PM

## 2013-08-31 ENCOUNTER — Inpatient Hospital Stay (HOSPITAL_COMMUNITY): Payer: No Typology Code available for payment source

## 2013-08-31 ENCOUNTER — Encounter (HOSPITAL_COMMUNITY): Payer: No Typology Code available for payment source | Admitting: Occupational Therapy

## 2013-08-31 ENCOUNTER — Inpatient Hospital Stay (HOSPITAL_COMMUNITY): Payer: Medicaid Other | Admitting: Speech Pathology

## 2013-08-31 LAB — GLUCOSE, CAPILLARY
Glucose-Capillary: 140 mg/dL — ABNORMAL HIGH (ref 70–99)
Glucose-Capillary: 114 mg/dL — ABNORMAL HIGH (ref 70–99)
Glucose-Capillary: 97 mg/dL (ref 70–99)
Glucose-Capillary: 128 mg/dL — ABNORMAL HIGH (ref 70–99)
Glucose-Capillary: 110 mg/dL — ABNORMAL HIGH (ref 70–99)
Glucose-Capillary: 157 mg/dL — ABNORMAL HIGH (ref 70–99)

## 2013-08-31 NOTE — Progress Notes (Signed)
Speech Language Pathology Daily Session Note  Patient Details  Name: [name] MRN: [id number] Date of Birth: 1955-12-21  Today's Date: 08/31/2013 Time: 0800-0825 Time Calculation (min): 25 min  Short Term Goals: Week 1: SLP Short Term Goal 1 (Week 1): Pt will consume current diet with Mod A verbal cues to minimize overt s/s of aspiration.  SLP Short Term Goal 2 (Week 1): Pt will utilize a slow rate of speech to increase speech intelligibility with Min multimodal cues.  SLP Short Term Goal 3 (Week 1): Pt will demonstrate sustained attention to a functional task for 15 minutes with Mod A verbal cues for redirection. SLP Short Term Goal 4 (Week 1): Pt will identify 2 cognitive impairments with Mod multimodal cues.  SLP Short Term Goal 5 (Week 1): Pt will utilize call bell to request assistance with Min multimodal cues.  SLP Short Term Goal 6 (Week 1): Pt will demonstrate functional problem solving for basic and familiar tasks with Mod A multimodal cues.   Skilled Therapeutic Interventions: Skilled treatment session focused on dysphagia and cognitive goals. SLP facilitated session by providing supervision verbal cues for utilization of small bites and a slow pace.  Pt consumed breakfast meal made up of Dys. 1 textures by preference and nectar-thick liquids with delayed cough X 2 at end of meal.  Pt required encouragement to keep HOB higher than 30 degrees after meal consumption of meal and for participation in remainder of session.  Pt independently requested her schedule to anticipate upcoming therapy session but required Min verbal and visual cues to read schedule appropriately.  Pt asked clinician to leave repeatedly and declined further participation in session, therefore, pt missed 20 minutes of skilled SLP intervention. Continue with current plan of care.    FIM:  Comprehension Comprehension Mode: Auditory Comprehension: 5-Understands basic 90% of the time/requires cueing < 10% of  the time Expression Expression Mode: Verbal Expression: 2-Expresses basic 25 - 49% of the time/requires cueing 50 - 75% of the time. Uses single words/gestures. Social Interaction Social Interaction: 3-Interacts appropriately 50 - 74% of the time - May be physically or verbally inappropriate. Problem Solving Problem Solving: 3-Solves basic 50 - 74% of the time/requires cueing 25 - 49% of the time Memory Memory: 4-Recognizes or recalls 75 - 89% of the time/requires cueing 10 - 24% of the time FIM - Eating Eating Activity: 5: Needs verbal cues/supervision  Pain No/Denies Pain  Therapy/Group: Individual Therapy  Buzzy Han 08/31/2013, 8:32 AM

## 2013-08-31 NOTE — Progress Notes (Signed)
Occupational Therapy Session Note  Patient Details  Name: [name] MRN: [id number] Date of Birth: Jan 30, 1956  Today's Date: 08/31/2013 Time: 2683-4196 Time Calculation (min): 42 min  Short Term Goals: Week 1:  OT Short Term Goal 1 (Week 1): Patient will increase bathing to supervision level. OT Short Term Goal 2 (Week 1): Patient will increase dressing to supervision level.  OT Short Term Goal 3 (Week 1): Patient will increase toileting to supervision. OT Short Term Goal 4 (Week 1): Patient will increase shower transfer to Supervision. OT Short Term Goal 5 (Week 1): Patient will increase fine and gross motor coordination to increase ability to perform daily tasks.   Skilled Therapeutic Interventions/Progress Updates:    Pt was seen for individual OT treatment session this morning for ADL retraining at shower level. Pt was asleep in bed and easily aroused & agreed to ADL session. Treatment focused on functional transfers (bed mobility, bed<>w/c, sit<>stand, w/c<>shower w/ tub bench), activity tolerance, participation/attention, bilateral use of UE's & safety w/ bathing and dressing at shower level. Pt was Min A sit<>stand transfers w/ vc's/tc's for safety. Pt is impulsive and tends to move quickly/rush through tasks while demonstrating decreased awareness of this. Pt was educated on safety and goals of OT and she stated "I'm ready" and then later stated "to go home". Reviewed w/ pt that she is currently requiring assistance for safety etc, pt shrugged her shoulders and closed her eyes. Pt sat on tub bench in shower and washed her bottom/peri area w/ lateral leans. Pt was noted to use bilateral UE's through out session demonstrating ataxia & frequently dropping items, > on L, however, this did not deter her from using L/R hands equally to assist w/ bathe/dressing tasks. Pt requested to get back to bed prior to end of therapy session and declined all further OT stating she was sleepy. Pt was  assisted back to bed and bed alarm was turned on, call bell was in reach. Pt immediately    closed her eyes.  Therapy Documentation Precautions:  Precautions Precautions: Fall Precaution Comments: impulsive, decreased spatial awareness Restrictions Weight Bearing Restrictions: No General: General Amount of Missed OT Time (min): 18 Minutes Missed Time Reason: Patient ill (comment);Patient unwilling/refused to participate without medical reason (dizziness) Vital Signs: Therapy Vitals Pulse Rate: 66 Pain: Pt denied pain when asked by OT prior to and after her session. "No, Hungry" per pt report when she was asked about pain. Pt was educated that lunch would be arriving soon. ADL: ADL Eating: Not assessed Grooming: Supervision/safety Where Assessed-Grooming: Sitting at sink Upper Body Bathing: Supervision/safety Where Assessed-Upper Body Bathing: Sitting at sink Lower Body Bathing: Supervision/safety;Minimal cueing Where Assessed-Lower Body Bathing: Sitting at sink Upper Body Dressing: Supervision/safety Where Assessed-Upper Body Dressing: Sitting at sink Lower Body Dressing: Minimal assistance Where Assessed-Lower Body Dressing: Sitting at sink;Standing at sink Toileting: Minimal assistance Where Assessed-Toileting: Glass blower/designer: Minimal assistance;Moderate verbal cueing Toilet Transfer Method: Stand pivot Tub/Shower Transfer: Not assessed  See FIM for current functional status  Therapy/Group: Individual Therapy  Amy B Barnhill 08/31/2013, 12:15 PM

## 2013-08-31 NOTE — Progress Notes (Signed)
[place] PHYSICAL MEDICINE & REHABILITATION     PROGRESS NOTE    Subjective/Complaints: No new issues. Performed fairly well with therapy when engaging ROS limited due to cognition/behavior  Objective: Vital Signs: Blood pressure 129/90, pulse 56, temperature 98 F (36.7 C), temperature source Oral, resp. rate 19, height 5\' 6"  (1.676 m), weight 84 kg (185 lb 3 oz), SpO2 94.00%. No results found. No results found for this basename: WBC, HGB, HCT, PLT,  in the last 72 hours No results found for this basename: NA, K, CL, CO, GLUCOSE, BUN, CREATININE, CALCIUM,  in the last 72 hours CBG (last 3)   Recent Labs  08/31/13 0129 08/31/13 0409 08/31/13 0722  GLUCAP 140* 114* 97    Wt Readings from Last 3 Encounters:  08/26/13 84 kg (185 lb 3 oz)  08/23/13 84.097 kg (185 lb 6.4 oz)  08/23/13 84.097 kg (185 lb 6.4 oz)    Physical Exam:   Constitutional: She is oriented to person, place, and time. She appears well-developed and well-nourished.  HENT:  Head: Normocephalic and atraumatic.  adentulous  Eyes: Conjunctivae are normal. Pupils are equal, round, and reactive to light. Right eye exhibits no discharge and no exudate. Left eye exhibits no discharge and no exudate. Right conjunctiva is not injected. Right eye exhibits nystagmus. Left eye exhibits nystagmus.  Neck: Normal range of motion. Neck supple.  Cardiovascular: Normal rate and regular rhythm.  Respiratory: Effort normal and breath sounds normal.  GI: Soft. Bowel sounds are normal. She exhibits no distension. There is no tenderness.   Musculoskeletal: She exhibits no edema and no tenderness.  Neurological: She is alert and oriented to person, place, and time.  Tends to keeps eyes closed. . She is able to follow basic commands without difficulty.Difficulty with 2 step commands She continues to display moderate to severe dysarthria. Severe dysmetria bilateral finger nose finger, mod dymetria R HS. Horizontal nystagmus right  lateral field. Impulsive with poor attention and impaired insight   Motor is 5/5 in bilateral delt, bi, tri , grip, HF, KE, ADF Skin: rash right shoulder Psych: flat. Keeps eyes closed  Assessment/Plan: 1. Functional deficits secondary to right paracentral cerebellar ICH which require 3+ hours per day of interdisciplinary therapy in a comprehensive inpatient rehab setting. Physiatrist is providing close team supervision and 24 hour management of active medical problems listed below. Physiatrist and rehab team continue to assess barriers to discharge/monitor patient progress toward functional and medical goals. FIM: FIM - Bathing Bathing Steps Patient Completed: Chest;Right Arm;Left Arm;Abdomen;Front perineal area;Buttocks;Left lower leg (including foot);Right lower leg (including foot);Left upper leg;Right upper leg Bathing: 4: Steadying assist  FIM - Upper Body Dressing/Undressing Upper body dressing/undressing: 0: Wears gown/pajamas-no public clothing FIM - Lower Body Dressing/Undressing Lower body dressing/undressing steps patient completed: Thread/unthread right underwear leg;Thread/unthread left underwear leg;Pull underwear up/down;Thread/unthread right pants leg;Thread/unthread left pants leg;Pull pants up/down;Don/Doff right sock;Don/Doff left sock Lower body dressing/undressing: 4: Steadying Assist  FIM - Toileting Toileting: 4: Steadying assist  FIM - Radio producer Devices: Grab bars Toilet Transfers: 3-From toilet/BSC: Mod A (lift or lower assist);3-To toilet/BSC: Mod A (lift or lower assist)  FIM - Bed/Chair Transfer Bed/Chair Transfer Assistive Devices: Bed rails;Arm rests Bed/Chair Transfer: 5: Supine > Sit: Supervision (verbal cues/safety issues);3: Chair or W/C > Bed: Mod A (lift or lower assist);3: Bed > Chair or W/C: Mod A (lift or lower assist)  FIM - Locomotion: Wheelchair Distance: 2x150' Locomotion: Wheelchair: 5: Travels 150 ft or  more: maneuvers  on rugs and over door sills with supervision, cueing or coaxing FIM - Locomotion: Ambulation Locomotion: Ambulation Assistive Devices: Walker - Rolling Ambulation/Gait Assistance: 3: Mod assist;4: Min assist Locomotion: Ambulation: 2: Travels 50 - 149 ft with moderate assistance (Pt: 50 - 74%)  Comprehension Comprehension Mode: Auditory Comprehension: 5-Understands basic 90% of the time/requires cueing < 10% of the time  Expression Expression Mode: Verbal Expression: 2-Expresses basic 25 - 49% of the time/requires cueing 50 - 75% of the time. Uses single words/gestures.  Social Interaction Social Interaction: 3-Interacts appropriately 50 - 74% of the time - May be physically or verbally inappropriate.  Problem Solving Problem Solving: 3-Solves basic 50 - 74% of the time/requires cueing 25 - 49% of the time  Memory Memory: 4-Recognizes or recalls 75 - 89% of the time/requires cueing 10 - 24% of the time   Medical Problem List and Plan:  1. Functional deficits secondary to Right paracentral cerebellar ICH  2. DVT Prophylaxis/Anticoagulation: Mechanical: Antiembolism stockings, knee (TED hose) Bilateral lower extremities  Sequential compression devices, below knee Bilateral lower extremities  3. Pain Management: Will continue vicodin prn.  4. H/o depression/Mood: prozac resumed  -neuropsych requested  -pt flat, withdrawn, not engaging.  5. Neuropsych: This patient is not capable of making decisions on her own behalf.  6. HTN: Will monitor with checks every 8 hours and titrate medications as indicated. Continue lopressor bid.  7. Hep C: Not a candidate for treatment due to ongoing drug abuse.  8. H/o gastritis:   Pepcid.  . Encouraging intake 9. H/o Knee pain: Added voltaren gel if needed..  10. Abnormal LFTs: still elevated. ?hep c and stress related.  11. Hypokelmia:  Labs normal  12. Thrombocytopenia: Will continue to monitor platelets as well as any signs of  bleeding. Trending up 13. Recent diffuse rash:   resolved.       LOS (Days) 5 A FACE TO FACE EVALUATION WAS PERFORMED  Meredith Staggers 08/31/2013 8:42 AM

## 2013-08-31 NOTE — Progress Notes (Signed)
Physical Therapy Session Note  Patient Details  Name: [name] MRN: [id number] Date of Birth: 02/08/1956  Today's Date: 08/31/2013 Time: Treatment Session 1: [id number]; Treatment Session 2: [id number] Time Calculation (min): Treatment Session 1: 15 min; Treatment Session 2: 13min  Short Term Goals: Week 1:  PT Short Term Goal 1 (Week 1): Pt to perform bed mobility with supervision and cues <25% of time for safety PT Short Term Goal 2 (Week 1): Pt to perform SPT bed<>w/c with min A and without cues to utilize visual targeting PT Short Term Goal 3 (Week 1): Pt to propel w/c 250' w/ B UE and with cues <25% of time to utilize visual targeting PT Short Term Goal 4 (Week 1): Pt to maintain dynamic standing balance during functional tasks with LRAD and min A PT Short Term Goal 5 (Week 1): Pt to ambulate 150' w/ LRAD, min A and cues <25% of time to utilize visual targeting  Skilled Therapeutic Interventions/Progress Updates:  Treatment Session 1:  1:1. Pt received semi-reclined in bed, ready for therapy. RN just finished providing pain medicine. Pt req min encouragement to sit EOB, upon impulsively sitting up pt immediately experienced bout of emesis and quickly laid back down. Pt req max encouragement to slowly sit EOB utilizing visual targeting with letter "A" on wall. Despite stating feeling less dizzy utilizing technique, pt would not remain sitting up. Further stating, "I'm too dizzy to do anything, I just need to go home." Pt refused to participate in remainder of session despite education regarding benefit of therapy for return to home environment. Pt left semi-reclined in bed w/ all needs in reach, bed alarm on. RN aware.   Treatment Session 2:  1:1. Pt received supine in bed, RN present and was about to assist pt to bathroom. PT taking over. Pt req max multimodal cues throughout session to slow down and attend to various "A" positioned throughout room/RW for visual targeting. Pt  utilizing this strategy approx 25% of session. Pt req min A for toileting. Pt's cousin present at start of session, commenting on pt's impulsivity stating, "she always moves this quick." Encouraged cousin to bring in additional clothing if possible and educated regarding's general safety plan. Cousin verbalized understanding. Pt amb 75'x2 this session w/ RW and min-mod A dependent upon impulsivity and decreased sustained attention to use of visual targeting tool. During second bout of ambulation, pt very quickly turning 270deg- decided she was done and heading back toward w/c. Pt req (S) for w/c propulsion 150'x2. Pt req min A for squat pivot t/f back to bed and supervision for t/f sit>sup. Pt states "yes" that visual targeting decreases dizziness, but still will not slow down and implement technique. Pt semi-reclined in bed at end of session w/ all needs in reach, bed alarm on.   Therapy Documentation Precautions:  Precautions Precautions: Fall Precaution Comments: impulsive, decreased spatial awareness Restrictions Weight Bearing Restrictions: No General: Amount of Missed PT Time (min): 45 Minutes Missed Time Reason: Patient ill (comment) (dizziness)  See FIM for current functional status  Therapy/Group: Individual Therapy  Gilmore Laroche 08/31/2013, 9:24 AM

## 2013-09-01 ENCOUNTER — Inpatient Hospital Stay (HOSPITAL_COMMUNITY): Payer: No Typology Code available for payment source | Admitting: Speech Pathology

## 2013-09-01 ENCOUNTER — Inpatient Hospital Stay (HOSPITAL_COMMUNITY): Payer: No Typology Code available for payment source | Admitting: Occupational Therapy

## 2013-09-01 ENCOUNTER — Encounter (HOSPITAL_COMMUNITY): Payer: No Typology Code available for payment source | Admitting: Occupational Therapy

## 2013-09-01 ENCOUNTER — Inpatient Hospital Stay (HOSPITAL_COMMUNITY): Payer: No Typology Code available for payment source

## 2013-09-01 DIAGNOSIS — I69993 Ataxia following unspecified cerebrovascular disease: Secondary | ICD-10-CM

## 2013-09-01 DIAGNOSIS — I619 Nontraumatic intracerebral hemorrhage, unspecified: Secondary | ICD-10-CM

## 2013-09-01 LAB — GLUCOSE, CAPILLARY
GLUCOSE-CAPILLARY: 109 mg/dL — AB (ref 70–99)
GLUCOSE-CAPILLARY: 131 mg/dL — AB (ref 70–99)
Glucose-Capillary: 136 mg/dL — ABNORMAL HIGH (ref 70–99)

## 2013-09-01 NOTE — Progress Notes (Signed)
Speech Language Pathology Daily Session Note  Patient Details  Name: [name] MRN: [id number] Date of Birth: 06-Apr-1955  Today's Date: 09/01/2013 Time: 0800-0835 Time Calculation (min): 35 min  Short Term Goals: Week 1: SLP Short Term Goal 1 (Week 1): Pt will consume current diet with Mod A verbal cues to minimize overt s/s of aspiration.  SLP Short Term Goal 2 (Week 1): Pt will utilize a slow rate of speech to increase speech intelligibility with Min multimodal cues.  SLP Short Term Goal 3 (Week 1): Pt will demonstrate sustained attention to a functional task for 15 minutes with Mod A verbal cues for redirection. SLP Short Term Goal 4 (Week 1): Pt will identify 2 cognitive impairments with Mod multimodal cues.  SLP Short Term Goal 5 (Week 1): Pt will utilize call bell to request assistance with Min multimodal cues.  SLP Short Term Goal 6 (Week 1): Pt will demonstrate functional problem solving for basic and familiar tasks with Mod A multimodal cues.   Skilled Therapeutic Interventions: Skilled treatment session focused on dysphagia and cognitive goals. SLP facilitated session by providing supervision verbal cues for utilization of small bites and a slow pace. Pt consumed breakfast meal made up of Dys. 1 textures by preference and nectar-thick liquids without overt s/s of aspiration. Pt required encouragement to keep HOB higher than 30 degrees after meal consumption and for participation in remainder of session. Pt participated in verbal description task with focus on speech intelligibility and pt required Max verbal and question cues at the word and phrase level. Pt declined in participation in remainder of session despite max encouragement, therefore, pt missed 10 minutes of skilled SLP intervention. Continue with current plan of care.    FIM:  Comprehension Comprehension Mode: Auditory Comprehension: 5-Understands basic 90% of the time/requires cueing < 10% of the  time Expression Expression Mode: Verbal Expression: 2-Expresses basic 25 - 49% of the time/requires cueing 50 - 75% of the time. Uses single words/gestures. Social Interaction Social Interaction: 2-Interacts appropriately 25 - 49% of time - Needs frequent redirection. Problem Solving Problem Solving: 3-Solves basic 50 - 74% of the time/requires cueing 25 - 49% of the time Memory Memory: 4-Recognizes or recalls 75 - 89% of the time/requires cueing 10 - 24% of the time FIM - Eating Eating Activity: 5: Needs verbal cues/supervision  Pain Pain Assessment Pain Assessment: No/denies pain  Therapy/Group: Individual Therapy  Buzzy Han 09/01/2013, 4:31 PM

## 2013-09-01 NOTE — Progress Notes (Signed)
Occupational Therapy Note  Patient Details  Name: [name] MRN: [id number] Date of Birth: 07/13/1955 Today's Date: 09/01/2013  Time In:  1120  Time Out:  1140.  Individual session, no c/o pain.  Patient seen for vestibular assessment - please see H&P for diagnosis and PMH.  Primary PT present for assessment.  Formal assessment very difficult and patient's answers extremely unreliable due to patient's cognition and behavior.  Deficits listed below are based on limited participation by patient and by observation of patient's responses to activities vs. Formal assessment.  Impairments include:  1.  Feelings of disequilibrium (not true vertigo) during assessment. 2.  Gaze stabilization deficits 3.  Visual - vestibular integration deficits (suspected) 4.  Eye malalignment 5.  Patient denied diplopia however on at least two occassions closing left eye;  Given this along with eye malalignment issues, suspect at least some diplopia especially in right middle and lower quadrants. 6.  Gaze holding nystagmus in left superior field. 7.  Suspect inability to suppress VOR based on patient comments and observations 8  Unable to do formal motion sensitivity testing however patient appears to be symptomatic with rolling left, head turns (worse with head turns right vs left), sit to stand. 9.  Gait with very wide base of support and lateral weight shifting vs lateral anterior weight shifting.  I was unable to get patient to ambulate without walker so would want to assess walker with 2 person hand held assist to see what happens with decreased proprioceptive input.  Ataxic gait appears improved over last few days.  Patient uses visual targeting, eyes closed and surface orientation (lying down) as ways to relieve symptoms.  Recommendations: 1.  Continue to assess as patient's cognition and behavior hopefully improve. 2.  Decrease time spent in bed as possible as this only make symptoms worse. 3.  Introduce  tasks in such a way as to encourage head turns, in sitting, standing and walking.  Patient will resist volitionally turning her head therefore therapeutic activities that require her to turn her head will need to be introduced. 4.  Increase demand for transitional movements (rolling,supine to sit, sit to supine, sit to stand, stand to sit) as behavior and cognition allow. 5.  Allow compensation at this point (visual target, increased proprioception) to keep patient safe during required activities especially given patient's severe impulsivity and extremely poor insight. 6. As cognition and behavior improved, can introduce gaze stabilization exercises as tolerated and make more of a demand on habituation vs compensation.   Estrella [facility] 09/01/2013, 12:21 PM

## 2013-09-01 NOTE — Progress Notes (Signed)
Physical Therapy Session Note  Patient Details  Name: [name] MRN: [id number] Date of Birth: [birth year]  Today's Date: 09/01/2013 Time: 1120-1140 Time Calculation (min): 20 min  Short Term Goals: Week 1:  PT Short Term Goal 1 (Week 1): Pt to perform bed mobility with supervision and cues <25% of time for safety PT Short Term Goal 2 (Week 1): Pt to perform SPT bed<>w/c with min A and without cues to utilize visual targeting PT Short Term Goal 3 (Week 1): Pt to propel w/c 250' w/ B UE and with cues <25% of time to utilize visual targeting PT Short Term Goal 4 (Week 1): Pt to maintain dynamic standing balance during functional tasks with LRAD and min A PT Short Term Goal 5 (Week 1): Pt to ambulate 150' w/ LRAD, min A and cues <25% of time to utilize visual targeting  Skilled Therapeutic Interventions/Progress Updates:    Assisted OT with vestibular evaluation. See OT eval for detailed findings.  Pt received semi-reclined in bed; asleep but easily awakened. Agreeable to participate with min coaxing. Due to limited pt tolerance of formal testing, findings limited to observation of pt response to positional change and functional movement. Will continue to assess in functional context. Therapists departed with pt semi-reclined in bed with 3 bed rails up, bed alarm on, all needs within reach and pt in no apparent distress.   Therapy Documentation Precautions:  Precautions Precautions: Fall Precaution Comments: impulsive, decreased spatial awareness Restrictions Weight Bearing Restrictions: No Pain: Pain Assessment Pain Assessment: No/denies pain  See FIM for current functional status  Therapy/Group: Co-Treatment  Benjie Karvonen A [place] 09/01/2013, 12:26 PM

## 2013-09-01 NOTE — Progress Notes (Signed)
Occupational Therapy Session Note  Patient Details  Name: [name] MRN: [id number] Date of Birth: September 04, 1955  Today's Date: 09/01/2013 Time:  -     Short Term Goals: Week 1:  OT Short Term Goal 1 (Week 1): Patient will increase bathing to supervision level. OT Short Term Goal 2 (Week 1): Patient will increase dressing to supervision level.  OT Short Term Goal 3 (Week 1): Patient will increase toileting to supervision. OT Short Term Goal 4 (Week 1): Patient will increase shower transfer to Supervision. OT Short Term Goal 5 (Week 1): Patient will increase fine and gross motor coordination to increase ability to perform daily tasks.   Skilled Therapeutic Interventions/Progress Updates:    Pt declined any activity- pulling blanket over her head- despite different approaches and encouragement.   Therapy Documentation Precautions:  Precautions Precautions: Fall Precaution Comments: impulsive, decreased spatial awareness Restrictions Weight Bearing Restrictions: No General: General Amount of Missed OT Time (min): 45 Minutes    Pain:  no c/o See FIM for current functional status  Therapy/Group: Individual Therapy  Merrilee Seashore 09/01/2013, 12:24 PM

## 2013-09-01 NOTE — Patient Care Conference (Signed)
Inpatient RehabilitationTeam Conference and Plan of Care Update Date: 08/31/2013   Time: 2:15 PM    Patient Name: [name]      Medical Record Number: [id number]  Date of Birth: 12-Aug-1955 Sex: Female         Room/Bed: 4W19C/4W19C-01 Payor Info: Payor: MEDICAID POTENTIAL / Plan: MEDICAID POTENTIAL / Product Type: *No Product type* /    Admitting Diagnosis: R cerebellar hemm  Admit Date/Time:  08/26/2013  6:22 PM Admission Comments: No comment available   Primary Diagnosis:  <principal problem not specified> Principal Problem: <principal problem not specified>  Patient Active Problem List   Diagnosis Date Noted  . ICH (intracerebral hemorrhage) 08/26/2013  . Cytotoxic cerebral edema 08/20/2013  . Brain herniation 08/20/2013  . Cerebellar hemorrhage, nontraumatic 08/18/2013  . Acute respiratory failure with hypoxia 08/18/2013  . Hepatitis C infection 08/03/2013  . Nausea with vomiting 08/03/2013  . Loss of weight 08/03/2013  . Arthritis 05/03/2013  . Influenza with respiratory manifestations 05/03/2013  . Acute hepatitis C virus infection without hepatic coma 05/03/2013    Expected Discharge Date: Expected Discharge Date:  (SNF)  Team Members Present: Physician leading conference: Dr. Alger Simons Social Worker Present: Lennart Pall, LCSW Nurse Present: Elliot Cousin, RN PT Present: Melene Plan, Cottie Banda, PT OT Present: Gareth Morgan, OT;Jennifer Tamala Julian, OT SLP Present: Weston Anna, SLP PPS Coordinator present : Daiva Nakayama, RN, CRRN;Becky Alwyn Ren, PT     Current Status/Progress Goal Weekly Team Focus  Medical   right paracentral cerebellar bleed, htn--rxed. substantial emotional/behavioral issues  increase engagement   vestibular rx,nutrition   Bowel/Bladder   Continent of bowl, Senna S one tablet daily. Straight cath to void.  Contient of bladder.  Monitor bowel and bladder daily   Swallow/Nutrition/ Hydration   Dys. 2 textures with  nectar-thick liquids, Max A  Min A with least restrictive diet  trials of upgraded liquids, increased use of swallowing compensatory strategies   ADL's   bathing/dressing-supervision; transfers-min A; decreased willingness to participate; impulsive; dizziness  bathing/dressing-mod I; toilet transfers/shower transfers-supervision; toileting-supervision  increased participation; functional amb with RW; transfers, safety awareness, dynamic standing balance   Mobility   (S) for w/c propulsion, min guard A for bed mobility and squat pivot t/fs, Min-mod A for standing balance, SPT, ambulation w/ RW and stairs  Mod(I) for sitting balance; (S) for dynamic standing balance, bed mobility, transfers, ambulation in controlled/home enviornments, stairs, w/c propulsion; Min A emergent awareness  functional endurance, safety during standing mobility, visual targeting during funcitonal mobility, dynamic standing balance, ambulation, emergent awareness   Communication   Max A  Min A  increased utilization of intelligibility strategies    Safety/Cognition/ Behavioral Observations  Max A  Min A  participation, attention, awareness, safety, problem solving    Pain   C/O headache, Acetaminophen 325-650, Norco 1-2 q4hr  Pain scale <3  Monitor pain level, keep pain level <3   Skin   Surgiacal wound posterior head, open to air.  Keep skin free of infection  Monitor wounds for signs of infection.    Rehab Goals Patient on target to meet rehab goals: No Rehab Goals Revised: poor participation overall - may not meet targeted goals *See Care Plan and progress notes for long and short-term goals.  Barriers to Discharge: behavior/ poor engagemetn    Possible Resolutions to Barriers:  staff trying to find ways to egage pt    Discharge Planning/Teaching Needs:  plan for SNF following CIR      Team  Discussion:  Declining therapies still.  Some better participation with morning tx.  Ataxic, impulsive and behavioral  issues..  Need to get her to slow down movements - especially speed of eating.  Can require as little as minimal assistance.  Still I/O caths.  SW reports plan for d/c is SNF, however, has known psych hx and no payor which will slow bed search.  Revisions to Treatment Plan:  None at this time.   Continued Need for Acute Rehabilitation Level of Care: The patient requires daily medical management by a physician with specialized training in physical medicine and rehabilitation for the following conditions: Daily direction of a multidisciplinary physical rehabilitation program to ensure safe treatment while eliciting the highest outcome that is of practical value to the patient.: Yes Daily medical management of patient stability for increased activity during participation in an intensive rehabilitation regime.: Yes Daily analysis of laboratory values and/or radiology reports with any subsequent need for medication adjustment of medical intervention for : Neurological problems;Cardiac problems  Lennart Pall 09/01/2013, 4:35 PM

## 2013-09-01 NOTE — Progress Notes (Signed)
Physical Therapy Session Note  Patient Details  Name: [name] MRN: [id number] Date of Birth: 08-Nov-1955  Today's Date: 09/01/2013 Time: 1535-1600 Time Calculation (min): 25 min  Short Term Goals: Week 1:  PT Short Term Goal 1 (Week 1): Pt to perform bed mobility with supervision and cues <25% of time for safety PT Short Term Goal 2 (Week 1): Pt to perform SPT bed<>w/c with min A and without cues to utilize visual targeting PT Short Term Goal 3 (Week 1): Pt to propel w/c 250' w/ B UE and with cues <25% of time to utilize visual targeting PT Short Term Goal 4 (Week 1): Pt to maintain dynamic standing balance during functional tasks with LRAD and min A PT Short Term Goal 5 (Week 1): Pt to ambulate 150' w/ LRAD, min A and cues <25% of time to utilize visual targeting  Skilled Therapeutic Interventions/Progress Updates:  1:1. Pt received semi-reclined in bed, agreeable to therapy. At start of session, pt encouraging pt to attend to "A" at bedside for visual targeting during t/f sup>sit, however, pt very impulsive and moving too quickly with EC to utilize. Pt immediately stating need to use bathroom and ambulating very quickly to shower and adamant about bathing. Overall, pt req close(S)-min A for dressing/bathing and mod A for all standing mobility due to impulsivity. Pt req max multimodal cueing due to impulsivity, often req hands on to physical stop mobility to increase safety. Immediately upon returning to sit EOB, pt lying back down and dressing at bed level. Pt refused further participation in session due to fatigue, despite encouragement. Pt semi-reclined in bed w/ all needs in reach, bed alarm on.    Therapy Documentation Precautions:  Precautions Precautions: Fall Precaution Comments: impulsive, decreased spatial awareness Restrictions Weight Bearing Restrictions: No General: Amount of Missed PT Time (min): 35 Minutes Missed Time Reason: Patient unwilling/refused to  participate without medical reason  See FIM for current functional status  Therapy/Group: Individual Therapy  Gilmore Laroche 09/01/2013, 4:06 PM

## 2013-09-01 NOTE — Progress Notes (Signed)
[facility] PHYSICAL MEDICINE & REHABILITATION     PROGRESS NOTE    Subjective/Complaints: No new problems. Participates sporadically with therapy. Denies new problems today.  ROS limited due to cognition/behavior  Objective: Vital Signs: Blood pressure 130/80, pulse 81, temperature 98.4 F (36.9 C), temperature source Oral, resp. rate 17, height 5\' 6"  (1.676 m), weight 84 kg (185 lb 3 oz), SpO2 99.00%. No results found. No results found for this basename: WBC, HGB, HCT, PLT,  in the last 72 hours No results found for this basename: NA, K, CL, CO, GLUCOSE, BUN, CREATININE, CALCIUM,  in the last 72 hours CBG (last 3)   Recent Labs  08/31/13 1627 08/31/13 2108 09/01/13 0728  GLUCAP 110* 157* 109*    Wt Readings from Last 3 Encounters:  08/26/13 84 kg (185 lb 3 oz)  08/23/13 84.097 kg (185 lb 6.4 oz)  08/23/13 84.097 kg (185 lb 6.4 oz)    Physical Exam:   Constitutional: She is oriented to person, place, and time. She appears well-developed and well-nourished.  HENT:  Head: Normocephalic and atraumatic.  adentulous  Eyes: Conjunctivae are normal. Pupils are equal, round, and reactive to light. Right eye exhibits no discharge and no exudate. Left eye exhibits no discharge and no exudate. Right conjunctiva is not injected. Right eye exhibits nystagmus. Left eye exhibits nystagmus.  Neck: Normal range of motion. Neck supple.  Cardiovascular: Normal rate and regular rhythm.  Respiratory: Effort normal and breath sounds normal.  GI: Soft. Bowel sounds are normal. She exhibits no distension. There is no tenderness.   Musculoskeletal: She exhibits no edema and no tenderness.  Neurological: She is alert and oriented to person, place, and time.  Tends to keeps eyes closed. . She is able to follow basic commands without difficulty.Difficulty with 2 step commands She continues to display moderate to severe dysarthria. Severe dysmetria bilateral finger nose finger, mod dymetria R HS.  Horizontal nystagmus right lateral field. Impulsive with poor attention and impaired insight   Motor is 5/5 in bilateral delt, bi, tri , grip, HF, KE, ADF Skin: rash right shoulder Psych: flat. Keeps eyes closed  Assessment/Plan: 1. Functional deficits secondary to right paracentral cerebellar ICH which require 3+ hours per day of interdisciplinary therapy in a comprehensive inpatient rehab setting. Physiatrist is providing close team supervision and 24 hour management of active medical problems listed below. Physiatrist and rehab team continue to assess barriers to discharge/monitor patient progress toward functional and medical goals.  Working toward snf placement given prognosis/dispo/participation  FIM: FIM - Bathing Bathing Steps Patient Completed: Chest;Right Arm;Left Arm;Abdomen;Front perineal area;Buttocks;Left lower leg (including foot);Right lower leg (including foot);Left upper leg;Right upper leg Bathing: 4: Steadying assist  FIM - Upper Body Dressing/Undressing Upper body dressing/undressing: 0: Wears gown/pajamas-no public clothing FIM - Lower Body Dressing/Undressing Lower body dressing/undressing steps patient completed: Thread/unthread right pants leg;Thread/unthread left pants leg;Pull pants up/down;Don/Doff right sock;Don/Doff left sock Lower body dressing/undressing: 4: Steadying Assist  FIM - Toileting Toileting steps completed by patient: Adjust clothing prior to toileting;Performs perineal hygiene;Adjust clothing after toileting Toileting: 4: Assist with fasteners  FIM - Radio producer Devices: Grab bars;Walker Toilet Transfers: 4-From toilet/BSC: Min A (steadying Pt. > 75%);4-To toilet/BSC: Min A (steadying Pt. > 75%)  FIM - Bed/Chair Transfer Bed/Chair Transfer Assistive Devices: Bed rails;Arm rests Bed/Chair Transfer: 5: Supine > Sit: Supervision (verbal cues/safety issues);5: Sit > Supine: Supervision (verbal cues/safety  issues);4: Bed > Chair or W/C: Min A (steadying Pt. > 75%);4: Chair or  W/C > Bed: Min A (steadying Pt. > 75%)  FIM - Locomotion: Wheelchair Distance: 2x150' Locomotion: Wheelchair: 5: Travels 150 ft or more: maneuvers on rugs and over door sills with supervision, cueing or coaxing FIM - Locomotion: Ambulation Locomotion: Ambulation Assistive Devices: Administrator Ambulation/Gait Assistance: 3: Mod assist;4: Min assist Locomotion: Ambulation: 2: Travels 50 - 149 ft with moderate assistance (Pt: 50 - 74%)  Comprehension Comprehension Mode: Auditory Comprehension: 5-Understands basic 90% of the time/requires cueing < 10% of the time  Expression Expression Mode: Verbal Expression: 2-Expresses basic 25 - 49% of the time/requires cueing 50 - 75% of the time. Uses single words/gestures.  Social Interaction Social Interaction: 3-Interacts appropriately 50 - 74% of the time - May be physically or verbally inappropriate.  Problem Solving Problem Solving: 3-Solves basic 50 - 74% of the time/requires cueing 25 - 49% of the time  Memory Memory: 4-Recognizes or recalls 75 - 89% of the time/requires cueing 10 - 24% of the time   Medical Problem List and Plan:  1. Functional deficits secondary to Right paracentral cerebellar ICH  2. DVT Prophylaxis/Anticoagulation: Mechanical: Antiembolism stockings, knee (TED hose) Bilateral lower extremities  Sequential compression devices, below knee Bilateral lower extremities  3. Pain Management: Will continue vicodin prn.  4. H/o depression/Mood: prozac resumed  -neuropsych requested  -pt flat, withdrawn, not engaging.  5. Neuropsych: This patient is not capable of making decisions on her own behalf.  6. HTN: Will monitor with checks every 8 hours and titrate medications as indicated. Continue lopressor bid.  7. Hep C: Not a candidate for treatment due to ongoing drug abuse.  8. H/o gastritis:   Pepcid.  . Encouraging intake 9. H/o Knee pain: Added  voltaren gel if needed..  10. Abnormal LFTs: still elevated. ?hep c and stress related.  11. Hypokelmia:  Labs normal  12. Thrombocytopenia: Will continue to monitor platelets as well as any signs of bleeding. Trending up 13. Recent diffuse rash:   resolved.       LOS (Days) 6 A FACE TO FACE EVALUATION WAS PERFORMED  Meredith Staggers 09/01/2013 8:53 AM

## 2013-09-02 ENCOUNTER — Inpatient Hospital Stay (HOSPITAL_COMMUNITY): Payer: No Typology Code available for payment source | Admitting: Physical Therapy

## 2013-09-02 ENCOUNTER — Encounter (HOSPITAL_COMMUNITY): Payer: No Typology Code available for payment source

## 2013-09-02 ENCOUNTER — Inpatient Hospital Stay (HOSPITAL_COMMUNITY): Payer: No Typology Code available for payment source | Admitting: Rehabilitation

## 2013-09-02 ENCOUNTER — Encounter (HOSPITAL_COMMUNITY): Payer: No Typology Code available for payment source | Admitting: Occupational Therapy

## 2013-09-02 LAB — GLUCOSE, CAPILLARY
GLUCOSE-CAPILLARY: 103 mg/dL — AB (ref 70–99)
Glucose-Capillary: 120 mg/dL — ABNORMAL HIGH (ref 70–99)
Glucose-Capillary: 127 mg/dL — ABNORMAL HIGH (ref 70–99)
Glucose-Capillary: 84 mg/dL (ref 70–99)

## 2013-09-02 NOTE — Progress Notes (Signed)
SLP Cancellation Note  Patient Details Name: [name] MRN: [id number] DOB: May 08, 1955   Cancelled treatment:       Amount of Missed SLP Time (min): 30 Minutes                                                                                          Missed Time Reason: Patient unwilling to participate.   Gunnar Fusi, M.A., [place]   Mora Appl 09/02/2013, 12:55 PM

## 2013-09-02 NOTE — Progress Notes (Signed)
Physical Therapy Note  Patient Details  Name: [name] MRN: [id number] Date of Birth: 06-11-1955 Today's Date: 09/02/2013  Attempted to see patient for 30 minutes this PM to make up for missed 30 minutes at [employer], however, patient refusing to participate in therapy session.  [place] Jeanice Dempsey, PT, DPT 09/02/2013, 2:46 PM

## 2013-09-02 NOTE — Care Management Note (Signed)
Inpatient Rehabilitation Center Individual Statement of Services  Patient Name:  [name]  Date:  08/27/2013  Welcome to the [facility].  Our goal is to provide you with an individualized program based on your diagnosis and situation, designed to meet your specific needs.  With this comprehensive rehabilitation program, you will be expected to participate in at least 3 hours of rehabilitation therapies Monday-Friday, with modified therapy programming on the weekends.  Your rehabilitation program will include the following services:  Physical Therapy (PT), Occupational Therapy (OT), Speech Therapy (ST), 24 hour per day rehabilitation nursing, Therapeutic Recreaction (TR), Neuropsychology, Case Management (Social Worker), Rehabilitation Medicine, Nutrition Services and Pharmacy Services  Weekly team conferences will be held on Tuesdays to discuss your progress.  Your Social Worker will talk with you frequently to get your input and to update you on team discussions.  Team conferences with you and your family in attendance may also be held.  Expected length of stay: 2-3 weeks  Overall anticipated outcome: supervision/ minimal assistance  Depending on your progress and recovery, your program may change. Your Social Worker will coordinate services and will keep you informed of any changes. Your Social Worker's name and contact numbers are listed  below.  The following services may also be recommended but are not provided by the [facility] will be made to provide these services after discharge if needed.  Arrangements include referral to agencies that provide these services.  Your insurance has been verified to be:  None (Medicaid application started) Your primary doctor is:  Dr.  Doreene Burke  Pertinent information will be shared with your doctor and your insurance company.  Social Worker:  Lennart Pall, [place] or ([phone]   Information discussed with and copy given to patient by: Lennart Pall, 08/27/2013, 2:42 PM

## 2013-09-02 NOTE — Progress Notes (Signed)
Social Work Patient ID: [name], female   DOB: 11/02/1955, 58 y.o.   MRN: [id number]  Have reviewed team conference with pt's family.  Attempted to discuss with pt, however, cannot arouse enough.  Family aware need to begin SNF now as finding an available bed will likely prove difficult.  Await Level 2 [place] at this time.  Continue to follow.  Lennart Pall, LCSW

## 2013-09-02 NOTE — Plan of Care (Signed)
Problem: RH Balance Goal: LTG Patient will maintain dynamic sitting balance (PT) LTG: Patient will maintain dynamic sitting balance with assistance during mobility activities (PT)  Downgraded due to impulsivity and decreased safety/emergent awareness.   Problem: RH Bed to Chair Transfers Goal: LTG Patient will perform bed/chair transfers w/assist (PT) LTG: Patient will perform bed/chair transfers with assistance, with/without cues (PT).  Downgraded due to impulsivity and decreased safety/emergent awareness.   Problem: RH Car Transfers Goal: LTG Patient will perform car transfers with assist (PT) LTG: Patient will perform car transfers with assistance (PT).  Outcome: Not Applicable Date Met:  78/24/23 N/A. D/c plan changed to SNF.   Problem: RH Furniture Transfers Goal: LTG Patient will perform furniture transfers w/assist (OT/PT LTG: Patient will perform furniture transfers with assistance (OT/PT).  Downgraded due to impulsivity and decreased safety/emergent awareness.   Problem: RH Floor Transfers Goal: LTG Patient will perform floor transfers w/assist (PT) LTG: Patient will perform floor transfers with assistance (PT).  Outcome: Not Applicable Date Met:  53/61/44 N/A, do not feel that pt can safely perform due to vestibular involvement.   Problem: RH Ambulation Goal: LTG Patient will ambulate in controlled environment (PT) LTG: Patient will ambulate in a controlled environment, # of feet with assistance (PT).  Downgraded due to impulsivity, decreased safety/emergent awareness and decreased tolerance to prolonged standing activity.   Problem: RH Awareness Goal: LTG: Patient will demonstrate intellectual/emergent (PT) LTG: Patient will demonstrate intellectual/emergent/anticipatory awareness with assist during a mobility activity (PT)  Downgraded due to impulsivity and decreased safety/emergent awareness.   Problem: RH Balance Goal: LTG Patient will maintain dynamic standing  balance (PT) LTG: Patient will maintain dynamic standing balance with assistance during mobility activities (PT)  Downgraded due to impulsivity, decreased safety/emergent awareness and decreased tolerance to prolonged standing activity.

## 2013-09-02 NOTE — Progress Notes (Signed)
Physical Therapy Session Note  Patient Details  Name: [name] MRN: [id number] Date of Birth: October 31, 1955  Today's Date: 09/02/2013 Time: 1324-4010 Time Calculation (min): 20 min  Short Term Goals: Week 1:  PT Short Term Goal 1 (Week 1): Pt to perform bed mobility with supervision and cues <25% of time for safety PT Short Term Goal 2 (Week 1): Pt to perform SPT bed<>w/c with min A and without cues to utilize visual targeting PT Short Term Goal 3 (Week 1): Pt to propel w/c 250' w/ B UE and with cues <25% of time to utilize visual targeting PT Short Term Goal 4 (Week 1): Pt to maintain dynamic standing balance during functional tasks with LRAD and min A PT Short Term Goal 5 (Week 1): Pt to ambulate 150' w/ LRAD, min A and cues <25% of time to utilize visual targeting  Skilled Therapeutic Interventions/Progress Updates:    Pt received semi reclined in bed; asleep and difficult to arouse. Pt initially refused to participate; however, finally agreed with max coaxing of RN and this PT. Performed supine>sit with HOB flat using rail with supervision. Pt's body language suggestive of increased dizziness/disequilibrium with supine>sit. Seated EOB with LUE support at bed rail, pt performed head turning in bilat directions and sustained cervical spine rotation while reading greeting cards. Pt tolerated head turning to R side for less time (compared with turning to L side). During final trial of head turning to R side, pt repositioned self into supine and refused to continue to participate. Sit>supine did not appear to increase dizziness/disequilibrium. Therapist departed with pt semi-reclined in bed with 3 bed rails up, bed alarm on, and all needs within reach.   Therapy Documentation Precautions:  Precautions Precautions: Fall Precaution Comments: impulsive, decreased spatial awareness Restrictions Weight Bearing Restrictions: No General: Amount of Missed PT Time (min): 10 Minutes Missed Time  Reason: Patient unwilling/refused to participate without medical reason Vital Signs: Therapy Vitals Temp: 97.7 F (36.5 C) Temp src: Oral Pulse Rate: 68 Resp: 16 BP: 93/66 mmHg Patient Position (if appropriate): Lying Oxygen Therapy SpO2: 95 % O2 Device: None (Room air) Pain: Pain Assessment Pain Assessment: No/denies pain  See FIM for current functional status  Therapy/Group: Individual Therapy  [name] Hobble 09/02/2013, 4:05 PM

## 2013-09-02 NOTE — Progress Notes (Signed)
Occupational Therapy Note  Patient Details  Name: [name] MRN: [id number] Date of Birth: 1956/03/09 Today's Date: 09/02/2013  Pt missed 60 mins skilled OT services scheduled from 1000-1100.  Pt asleep in bed upon arrival and did not open eyes during 10 mins in room.  Pt did respond adamantly that she wasn't going to do anything this morning.  Pt stated "leave me alone" several times throughout 10 mins spent encouraging patient to participate in therapy.   Anselmo Rod Cason Luffman 09/02/2013, 12:03 PM

## 2013-09-02 NOTE — Progress Notes (Signed)
Social Work  Social Work Assessment and Plan  Patient Details  Name: [name] MRN: [id number] Date of Birth: 17-May-1955  Today's Date: 08/27/2013  Problem List:  Patient Active Problem List   Diagnosis Date Noted  . ICH (intracerebral hemorrhage) 08/26/2013  . Cytotoxic cerebral edema 08/20/2013  . Brain herniation 08/20/2013  . Cerebellar hemorrhage, nontraumatic 08/18/2013  . Acute respiratory failure with hypoxia 08/18/2013  . Hepatitis C infection 08/03/2013  . Nausea with vomiting 08/03/2013  . Loss of weight 08/03/2013  . Arthritis 05/03/2013  . Influenza with respiratory manifestations 05/03/2013  . Acute hepatitis C virus infection without hepatic coma 05/03/2013   Past Medical History:  Past Medical History  Diagnosis Date  . Hepatitis C   . Pneumonia   . Hypertension   . Diabetes mellitus without complication    Past Surgical History:  Past Surgical History  Procedure Laterality Date  . Neck surgery    . Shoulder surgery Bilateral   . Craniectomy N/A 08/18/2013    Procedure: CRANIECTOMY POSTERIOR FOSSA DECOMPRESSION, EVACUATION OF HEMORRHAGE;  Surgeon: Consuella Lose, MD;  Location: [facility];  Service: Neurosurgery;  Laterality: N/A;   Social History:  reports that she has been smoking Cigarettes.  She has a 80 pack-year smoking history. She has never used smokeless tobacco. She reports that she drinks about 27.6 ounces of alcohol per week. She reports that she uses illicit drugs (Cocaine).  Family / Support Systems Marital Status: Single Patient Roles: Parent Children: daughter, [name], is 32 y.o. and attedning Early Secretary/administrator at [employer].  Living with aunt, [name]. Other Supports: pt's aunt, [name] [phone];  aunt, [name] @ [phone];  cousin, [name] @ [phone] Anticipated Caregiver: plans are for SNF after reaches goals for inpt rehab stay Ability/Limitations of Caregiver: [name] is 41 yo Aunt who  is caring for [name] with recent anuerysm procedure Caregiver Availability: Intermittent Family Dynamics: aunt, [name], states that she is hopeful pt could "eventually come home", however, notes that she is not able to provide needed assistance to her at current LOF  Social History Preferred language: English Religion: Baptist Cultural Background: NA Education: HS Read: Yes Write: Yes Employment Status: Unemployed Date Retired/Disabled/Unemployed: aunt notes that pt has not worked for several years "...she's been ill for a long time..." Legal Hisotry/Current Legal Issues: None  - questioned aunt on whether anyone has legal custody of pt's daughter - No.  Recommended that they may want to pursue this unless she is to turn 39 soon. Guardian/Conservator: None - per MD, pt not capable of making decisions on her own behalf.  Would defer decisions to pt's aunt, [name], as daughter is a minor.   Abuse/Neglect Physical Abuse: Denies Verbal Abuse: Denies Sexual Abuse: Denies Exploitation of patient/patient's resources: Denies Self-Neglect: Denies  Emotional Status Pt's affect, behavior adn adjustment status: Pt lying in bed with covers pulled up to face.  Agreeable to interview, however, never opens eyes.  Difficult to understand most responses.  After approx 5 minutes, pt turns away and stops talking.  Cannot assess adjustment status or emotional response due to poor cognition and  behavioral issues.  Will monitor. Recent Psychosocial Issues: As noted, pt's aunt reports pt was "...been ill for a long time.." including both physical illness and mental/ substance dependence illness Pyschiatric History: aunt reports that pt has been followed at [facility]) for depression -  Substance Abuse History: aunt also reports that pt had "problems with  drinking.  I don't know about the drugs."  and that she had "...been talking to her about going back" to SA treatment.  Of note, pt was positve  for cocaine upon admit.  Patient / Family Perceptions, Expectations & Goals Pt/Family understanding of illness & functional limitations: Pt states, "i had an aneurysm".  Aunt with very basic understanding of pt's ICH and current functional limitations. Premorbid pt/family roles/activities: Aunt reports that pt has not worked "for a long time".  Spent most of the day in the home and watching t.v.  and cleaning. Anticipated changes in roles/activities/participation: Pt with severe cognitive impairements and will require 24/7 care for an indefinite period of time.  Family cannnot take on caregiver duties and plan is for SNF. Pt/family expectations/goals: Family hopeful pt can be placed at local SNF.  Community Resources [employer]s: Other (Comment) ([place]) Premorbid Home Care/DME Agencies: None Transportation available at discharge: no Resource referrals recommended: Neuropsychology;Support group (specify)  Discharge Planning Living Arrangements: Other relatives;Other (Comment);Children (lives with her cousin [name] and pt's daughter, [name]) Support Systems: Children;Other relatives Type of Residence: Private residence Insurance Resources: Teacher, adult education Resources: Family Support Financial Screen Referred: Previously completed Living Expenses: Lives with family Money Management: Patient Does the patient have any problems obtaining your medications?: Yes (Describe) (no insurance) Home Management: pt shared with family Patient/Family Preliminary Plans: Plan is for pt to d/c to SNF as family unable to provide 24/7 care Barriers to Discharge: Family Support Social Work Anticipated Follow Up Needs: SNF Expected length of stay: ELOS 16 to 22 days or until inpt rehab goals met for continued rehab at [facility].  Clinical Impression Unfortunate woman here following a hypertensive ICH and with significant cognitive impairments.  Known upon admit that family cannot  provide 24/7 care and plan will be to transition pt to SNF following CIR.  Will likely require level 2 [place] which may slow bed search.  Follow for support, education and d/c planning.  Lennart Pall 08/27/2013, 2:35 PM

## 2013-09-02 NOTE — Progress Notes (Signed)
Physical Therapy Session Note  Patient Details  Name: [name] MRN: [id number] Date of Birth: 10-25-1955  Today's Date: 09/02/2013 Time: 0272-5366 Time Calculation (min): 41 min  Short Term Goals: Week 1:  PT Short Term Goal 1 (Week 1): Pt to perform bed mobility with supervision and cues <25% of time for safety PT Short Term Goal 2 (Week 1): Pt to perform SPT bed<>w/c with min A and without cues to utilize visual targeting PT Short Term Goal 3 (Week 1): Pt to propel w/c 250' w/ B UE and with cues <25% of time to utilize visual targeting PT Short Term Goal 4 (Week 1): Pt to maintain dynamic standing balance during functional tasks with LRAD and min A PT Short Term Goal 5 (Week 1): Pt to ambulate 150' w/ LRAD, min A and cues <25% of time to utilize visual targeting  Skilled Therapeutic Interventions/Progress Updates:   Pt received lying in bed asleep, but easily aroused with voice.  Pt initially resistant to getting OOB, however with option of getting up to eat breakfast, pt agreeable.  Performed bed mobility with supervision with bed rails and max cues for safety.  Transferred to w/c with mod assist stand pivot and max verbal cues for safety and technique, as she continues to demonstrate extreme impulsivity and does not take cues for visual targeting.  Once in chair, provided supervision and cues for decreased speed for eating grits and taking a few sips of thickened milk.  Pt then pushing tray away and states "I don't want any more."  Pt self propelled to therapy gym x 150' with supervision.  Once in gym, transferred to/from therapy mat via stand pivot with mod assist.  Did somewhat better with slower speed with cuing, however still too impulsive for visual targeting.  Worked on folding towel activity to encourage head turns side/side.  Pt mostly resistant for head turns, however with therapist being far outside of peripheral vision, pt did slightly turn head.  Pt then agreeable for short  distance ambulation x 40' to ADL apt with RW.  Pt continues to count aloud for stepping sequence and seems to keep gaze ahead.  Requires light mod A for gait, however was extremely impulsive when sitting on couch.  Once on couch, pt attempting to state "I need something" and then asking for a "cup."  When calling out to RN to retrieve one (thought pt wanted something to drink), pt vomited over side of couch.  RN notified and assisted back to room.  Pt able to change shirt with S and transferred back to bed.  Encouraged pt to sit with HOB elevated, as lying in bed will only worsen vestibular issues. Pt left with all needs in reach, 3 bed rails up and bed alarm set.   Therapy Documentation Precautions:  Precautions Precautions: Fall Precaution Comments: impulsive, decreased spatial awareness Restrictions Weight Bearing Restrictions: No General: Amount of Missed PT Time (min): 19 Minutes Missed Time Reason: Patient ill (comment) (throwing up) Vital Signs: Therapy Vitals Pulse Rate: 51 BP: 124/82 mmHg Pain:no c/o pain, however with dizziness and nausea during session.  Ambulation Ambulation/Gait Assistance: 3: Mod assist Wheelchair Mobility Distance: 150   See FIM for current functional status  Therapy/Group: Individual Therapy  Raquel Sarna A Eulan Heyward 09/02/2013, 10:29 AM

## 2013-09-02 NOTE — Progress Notes (Signed)
[place] PHYSICAL MEDICINE & REHABILITATION     PROGRESS NOTE    Subjective/Complaints: Pt denies problems. Still not engaging much. Impulsive at times  ROS limited due to cognition/behavior  Objective: Vital Signs: Blood pressure 124/82, pulse 51, temperature 98.3 F (36.8 C), temperature source Oral, resp. rate 18, height 5\' 6"  (1.676 m), weight 84 kg (185 lb 3 oz), SpO2 96.00%. No results found. No results found for this basename: WBC, HGB, HCT, PLT,  in the last 72 hours No results found for this basename: NA, K, CL, CO, GLUCOSE, BUN, CREATININE, CALCIUM,  in the last 72 hours CBG (last 3)   Recent Labs  09/01/13 1712 09/01/13 2150 09/02/13 0738  GLUCAP 136* 120* 103*    Wt Readings from Last 3 Encounters:  08/26/13 84 kg (185 lb 3 oz)  08/23/13 84.097 kg (185 lb 6.4 oz)  08/23/13 84.097 kg (185 lb 6.4 oz)    Physical Exam:   Constitutional: She is oriented to person, place, and time. She appears well-developed and well-nourished.  HENT:  Head: Normocephalic and atraumatic.  adentulous  Eyes: Conjunctivae are normal. Pupils are equal, round, and reactive to light. Right eye exhibits no discharge and no exudate. Left eye exhibits no discharge and no exudate. Right conjunctiva is not injected. Right eye exhibits nystagmus. Left eye exhibits nystagmus.  Neck: Normal range of motion. Neck supple.  Cardiovascular: Normal rate and regular rhythm.  Respiratory: Effort normal and breath sounds normal.  GI: Soft. Bowel sounds are normal. She exhibits no distension. There is no tenderness.   Musculoskeletal: She exhibits no edema and no tenderness.  Neurological: She is alert and oriented to person, place, and time.    She is able to follow basic commands without difficulty. She continues to display moderate to severe dysarthria. Severe dysmetria bilateral finger nose finger, mod dymetria R HS. Impulsive with poor attention and impaired insight   Motor is 5/5 in bilateral  delt, bi, tri , grip, HF, KE, ADF Skin: rash right shoulder Psych: flat. Still often keeps eyes closed  Assessment/Plan: 1. Functional deficits secondary to right paracentral cerebellar ICH which require 3+ hours per day of interdisciplinary therapy in a comprehensive inpatient rehab setting. Physiatrist is providing close team supervision and 24 hour management of active medical problems listed below. Physiatrist and rehab team continue to assess barriers to discharge/monitor patient progress toward functional and medical goals.  Working toward snf placement given prognosis/dispo/participation  FIM: FIM - Bathing Bathing Steps Patient Completed: Chest;Right Arm;Left Arm;Abdomen;Front perineal area;Buttocks;Left lower leg (including foot);Right lower leg (including foot);Left upper leg;Right upper leg Bathing: 4: Steadying assist  FIM - Upper Body Dressing/Undressing Upper body dressing/undressing steps patient completed: Thread/unthread right sleeve of pullover shirt/dresss;Pull shirt over trunk;Thread/unthread right sleeve of front closure shirt/dress;Thread/unthread left sleeve of front closure shirt/dress Upper body dressing/undressing: 5: Supervision: Safety issues/verbal cues FIM - Lower Body Dressing/Undressing Lower body dressing/undressing steps patient completed: Thread/unthread right pants leg;Thread/unthread left pants leg;Pull pants up/down;Don/Doff right sock;Don/Doff left sock;Pull underwear up/down Lower body dressing/undressing: 4: Steadying Assist  FIM - Toileting Toileting steps completed by patient: Adjust clothing prior to toileting;Performs perineal hygiene;Adjust clothing after toileting Toileting: 4: Assist with fasteners  FIM - Radio producer Devices: Grab bars;Walker Toilet Transfers: 4-From toilet/BSC: Min A (steadying Pt. > 75%);4-To toilet/BSC: Min A (steadying Pt. > 75%)  FIM - Bed/Chair Transfer Bed/Chair Transfer Assistive  Devices: Bed rails;Arm rests Bed/Chair Transfer: 5: Supine > Sit: Supervision (verbal cues/safety issues);5: Sit > Supine:  Supervision (verbal cues/safety issues);3: Bed > Chair or W/C: Mod A (lift or lower assist);3: Chair or W/C > Bed: Mod A (lift or lower assist)  FIM - Locomotion: Wheelchair Distance: 2x150' Locomotion: Wheelchair: 0: Activity did not occur FIM - Locomotion: Ambulation Locomotion: Ambulation Assistive Devices: Administrator Ambulation/Gait Assistance: 3: Mod assist Locomotion: Ambulation: 1: Travels less than 50 ft with moderate assistance (Pt: 50 - 74%)  Comprehension Comprehension Mode: Auditory Comprehension: 5-Understands basic 90% of the time/requires cueing < 10% of the time  Expression Expression Mode: Verbal Expression: 2-Expresses basic 25 - 49% of the time/requires cueing 50 - 75% of the time. Uses single words/gestures.  Social Interaction Social Interaction: 3-Interacts appropriately 50 - 74% of the time - May be physically or verbally inappropriate.  Problem Solving Problem Solving: 3-Solves basic 50 - 74% of the time/requires cueing 25 - 49% of the time  Memory Memory: 4-Recognizes or recalls 75 - 89% of the time/requires cueing 10 - 24% of the time   Medical Problem List and Plan:  1. Functional deficits secondary to Right paracentral cerebellar ICH  2. DVT Prophylaxis/Anticoagulation: Mechanical: Antiembolism stockings, knee (TED hose) Bilateral lower extremities  Sequential compression devices, below knee Bilateral lower extremities  3. Pain Management: Will continue vicodin prn.  4. H/o depression/Mood: prozac resumed  -neuropsych requested  -pt flat, withdrawn, not engaging.  5. Neuropsych: This patient is not capable of making decisions on her own behalf.  6. HTN: Will monitor with checks every 8 hours and titrate medications as indicated. Continue lopressor bid.  7. Hep C: Not a candidate for treatment due to ongoing drug abuse.  8.  H/o gastritis:   Pepcid.  . Encouraging intake 9. H/o Knee pain: Added voltaren gel if needed..  10. Abnormal LFTs: still elevated. ?hep c and stress related.  11. Hypokelmia:  Labs normal  12. Thrombocytopenia: Will continue to monitor platelets as well as any signs of bleeding. Trending up 13. Recent diffuse rash:   resolved.       LOS (Days) 7 A FACE TO FACE EVALUATION WAS PERFORMED  Meredith Staggers 09/02/2013 8:35 AM

## 2013-09-03 ENCOUNTER — Inpatient Hospital Stay (HOSPITAL_COMMUNITY): Payer: No Typology Code available for payment source

## 2013-09-03 ENCOUNTER — Encounter (HOSPITAL_COMMUNITY): Payer: No Typology Code available for payment source

## 2013-09-03 ENCOUNTER — Inpatient Hospital Stay (HOSPITAL_COMMUNITY): Payer: Medicaid Other

## 2013-09-03 ENCOUNTER — Inpatient Hospital Stay (HOSPITAL_COMMUNITY): Payer: No Typology Code available for payment source | Admitting: Physical Therapy

## 2013-09-03 DIAGNOSIS — I619 Nontraumatic intracerebral hemorrhage, unspecified: Secondary | ICD-10-CM

## 2013-09-03 DIAGNOSIS — I69993 Ataxia following unspecified cerebrovascular disease: Secondary | ICD-10-CM

## 2013-09-03 NOTE — Progress Notes (Signed)
Occupational Therapy Weekly Progress Note  Patient Details  Name: [name] MRN: [id number] Date of Birth: Feb 27, 1956  Beginning of progress report period: Aug 27, 2013 End of progress report period: September 03, 2013  Today's Date: 09/03/2013  Patient has met 1 of 5 short term goals.  Pt progress with BADLs has been minimal since admission.  Pt has been reluctant to participate on a regular basis and has required max encouragement to engage.  Pt has missed 4 OT sessions since admission. Pt continues to require min A/mod A for transfers and steady A when standing to complete bathing and dressing tasks.  Pt's fine and gross movements continue to exhibit decreased control although they have improved such that she is able to complete bathing and dressing tasks.  Pt denies dizziness but continues to exhibit LOB with sit<>stand and during transfers/amb.   Patient continues to demonstrate the following deficits: static and dynamic standing balance, safety awareness, functional transfers, ADL performance, fine and gross motor coordination, impulsiveness, and decreased Bil UE strength and therefore will continue to benefit from skilled OT intervention to enhance overall performance with BADL.  Patient progressing toward long term goals..  Continue plan of care.  OT Short Term Goals Week 1:  OT Short Term Goal 1 (Week 1): Patient will increase bathing to supervision level. OT Short Term Goal 1 - Progress (Week 1): Progressing toward goal OT Short Term Goal 2 (Week 1): Patient will increase dressing to supervision level.  OT Short Term Goal 2 - Progress (Week 1): Progressing toward goal OT Short Term Goal 3 (Week 1): Patient will increase toileting to supervision. OT Short Term Goal 3 - Progress (Week 1): Progressing toward goal OT Short Term Goal 4 (Week 1): Patient will increase shower transfer to Supervision. OT Short Term Goal 4 - Progress (Week 1): Progressing toward goal OT Short Term Goal 5  (Week 1): Patient will increase fine and gross motor coordination to increase ability to perform daily tasks.  OT Short Term Goal 5 - Progress (Week 1): Met Week 2:  OT Short Term Goal 1 (Week 2): STG=LTG       Therapy Documentation Precautions:  Precautions Precautions: Fall Precaution Comments: impulsive, decreased spatial awareness Restrictions Weight Bearing Restrictions: No  See FIM for current functional status  Therapy/Group: Individual Therapy  Leroy Libman 09/03/2013, 7:01 AM   Ailene Ravel, OTR/L,CBIS

## 2013-09-03 NOTE — Progress Notes (Signed)
[facility] PHYSICAL MEDICINE & REHABILITATION     PROGRESS NOTE    Subjective/Complaints: No new issues. Doesn't want to be disturbed  ROS limited due to cognition/behavior  Objective: Vital Signs: Blood pressure 118/73, pulse 60, temperature 97.4 F (36.3 C), temperature source Oral, resp. rate 16, height 5\' 6"  (1.676 m), weight 84 kg (185 lb 3 oz), SpO2 99.00%. No results found. No results found for this basename: WBC, HGB, HCT, PLT,  in the last 72 hours No results found for this basename: NA, K, CL, CO, GLUCOSE, BUN, CREATININE, CALCIUM,  in the last 72 hours CBG (last 3)   Recent Labs  09/02/13 0738 09/02/13 1121 09/02/13 1621  GLUCAP 103* 127* 84    Wt Readings from Last 3 Encounters:  08/26/13 84 kg (185 lb 3 oz)  08/23/13 84.097 kg (185 lb 6.4 oz)  08/23/13 84.097 kg (185 lb 6.4 oz)    Physical Exam:   Constitutional: She is oriented to person, place, and time. She appears well-developed and well-nourished.  HENT:  Head: Normocephalic and atraumatic.  adentulous  Eyes: Conjunctivae are normal. Pupils are equal, round, and reactive to light. Right eye exhibits no discharge and no exudate. Left eye exhibits no discharge and no exudate. Right conjunctiva is not injected. Right eye exhibits nystagmus. Left eye exhibits nystagmus.  Neck: Normal range of motion. Neck supple.  Cardiovascular: Normal rate and regular rhythm.  Respiratory: Effort normal and breath sounds normal.  GI: Soft. Bowel sounds are normal. She exhibits no distension. There is no tenderness.   Musculoskeletal: She exhibits no edema and no tenderness.  Neurological: She is alert and oriented to person, place, and time.    She is able to follow basic commands without difficulty. She continues to display moderate to severe dysarthria. Severe dysmetria bilateral finger nose finger, mod dymetria R HS. Impulsive with poor attention and impaired insight   Motor is 5/5 in bilateral delt, bi, tri , grip,  HF, KE, ADF Skin: rash right shoulder Psych: flat. Still often keeps eyes closed  Assessment/Plan: 1. Functional deficits secondary to right paracentral cerebellar ICH which require 3+ hours per day of interdisciplinary therapy in a comprehensive inpatient rehab setting. Physiatrist is providing close team supervision and 24 hour management of active medical problems listed below. Physiatrist and rehab team continue to assess barriers to discharge/monitor patient progress toward functional and medical goals.    snf placement given prognosis/dispo/participation  FIM: FIM - Bathing Bathing Steps Patient Completed: Chest;Right Arm;Left Arm;Abdomen;Front perineal area;Buttocks;Left lower leg (including foot);Right lower leg (including foot);Left upper leg;Right upper leg Bathing: 4: Steadying assist  FIM - Upper Body Dressing/Undressing Upper body dressing/undressing steps patient completed: Thread/unthread right sleeve of pullover shirt/dresss;Pull shirt over trunk;Thread/unthread right sleeve of front closure shirt/dress;Thread/unthread left sleeve of front closure shirt/dress Upper body dressing/undressing: 5: Supervision: Safety issues/verbal cues FIM - Lower Body Dressing/Undressing Lower body dressing/undressing steps patient completed: Thread/unthread right pants leg;Thread/unthread left pants leg;Pull pants up/down;Don/Doff right sock;Don/Doff left sock;Pull underwear up/down Lower body dressing/undressing: 4: Steadying Assist  FIM - Toileting Toileting steps completed by patient: Adjust clothing prior to toileting;Performs perineal hygiene;Adjust clothing after toileting Toileting: 4: Assist with fasteners  FIM - Radio producer Devices: Grab bars;Walker Toilet Transfers: 4-From toilet/BSC: Min A (steadying Pt. > 75%);4-To toilet/BSC: Min A (steadying Pt. > 75%)  FIM - Bed/Chair Transfer Bed/Chair Transfer Assistive Devices: Bed rails;Arm  rests Bed/Chair Transfer: 5: Supine > Sit: Supervision (verbal cues/safety issues);5: Sit > Supine: Supervision (verbal  cues/safety issues);4: Bed > Chair or W/C: Min A (steadying Pt. > 75%);4: Chair or W/C > Bed: Min A (steadying Pt. > 75%)  FIM - Locomotion: Wheelchair Distance: 150 Locomotion: Wheelchair: 0: Activity did not occur FIM - Locomotion: Ambulation Locomotion: Ambulation Assistive Devices: Administrator Ambulation/Gait Assistance: 3: Mod assist Locomotion: Ambulation: 0: Activity did not occur  Comprehension Comprehension Mode: Auditory Comprehension: 5-Understands basic 90% of the time/requires cueing < 10% of the time  Expression Expression Mode: Verbal Expression: 2-Expresses basic 25 - 49% of the time/requires cueing 50 - 75% of the time. Uses single words/gestures.  Social Interaction Social Interaction: 3-Interacts appropriately 50 - 74% of the time - May be physically or verbally inappropriate.  Problem Solving Problem Solving: 3-Solves basic 50 - 74% of the time/requires cueing 25 - 49% of the time  Memory Memory: 4-Recognizes or recalls 75 - 89% of the time/requires cueing 10 - 24% of the time   Medical Problem List and Plan:  1. Functional deficits secondary to Right paracentral cerebellar ICH  2. DVT Prophylaxis/Anticoagulation: Mechanical: Antiembolism stockings, knee (TED hose) Bilateral lower extremities  Sequential compression devices, below knee Bilateral lower extremities  3. Pain Management: Will continue vicodin prn.  4. H/o depression/Mood: prozac resumed  -neuropsych requested  -pt flat, withdrawn, not engaging.  5. Neuropsych: This patient is not capable of making decisions on her own behalf.  6. HTN: Will monitor with checks every 8 hours and titrate medications as indicated. Continue lopressor bid.  7. Hep C: Not a candidate for treatment due to ongoing drug abuse.  8. H/o gastritis:   Pepcid.  . Encouraging intake 9. H/o Knee pain:  Added voltaren gel if needed..  10. Abnormal LFTs: still elevated. ?hep c and stress related.  11. Hypokelmia:  Labs normal  12. Thrombocytopenia: Will continue to monitor platelets as well as any signs of bleeding. Trending up 13. Recent diffuse rash:   resolved.       LOS (Days) 8 A FACE TO FACE EVALUATION WAS PERFORMED  [name] 09/03/2013 8:51 AM

## 2013-09-03 NOTE — Progress Notes (Signed)
Speech Language Pathology Weekly Progress   Patient Details  Name: [name] MRN: [id number] Date of Birth: [birth year]  Beginning of progress report period: Aug 27, 2013 End of progress report period: September 03, 2013  Today's Date: 09/03/2013  Short Term Goals: Week 1: SLP Short Term Goal 1 (Week 1): Pt will consume current diet with Mod A verbal cues to minimize overt s/s of aspiration.  SLP Short Term Goal 1 - Progress (Week 1): Met SLP Short Term Goal 2 (Week 1): Pt will utilize a slow rate of speech to increase speech intelligibility with Min multimodal cues.  SLP Short Term Goal 2 - Progress (Week 1): Progressing toward goal SLP Short Term Goal 3 (Week 1): Pt will demonstrate sustained attention to a functional task for 15 minutes with Mod A verbal cues for redirection. SLP Short Term Goal 3 - Progress (Week 1): Progressing toward goal SLP Short Term Goal 4 (Week 1): Pt will identify 2 cognitive impairments with Mod multimodal cues.  SLP Short Term Goal 4 - Progress (Week 1): Progressing toward goal SLP Short Term Goal 5 (Week 1): Pt will utilize call bell to request assistance with Min multimodal cues.  SLP Short Term Goal 5 - Progress (Week 1): Progressing toward goal SLP Short Term Goal 6 (Week 1): Pt will demonstrate functional problem solving for basic and familiar tasks with Mod A multimodal cues.  SLP Short Term Goal 6 - Progress (Week 1): Progressing toward goal    New Short Term Goals: Week 2: SLP Short Term Goal 1 (Week 2): Pt will consume current diet with Min A verbal cues to minimize overt s/s of aspiration.  SLP Short Term Goal 2 (Week 2): Pt will utilize a slow rate of speech to increase speech intelligibility with Mod multimodal cues.  SLP Short Term Goal 3 (Week 2): Pt will demonstrate sustained attention to a functional task for 10 minutes with Mod A verbal cues for redirection. SLP Short Term Goal 4 (Week 2): Pt will identify 2 cognitive impairments with Max  multimodal cues.  SLP Short Term Goal 5 (Week 2): Pt will utilize call bell to request assistance with Mod multimodal cues.  SLP Short Term Goal 6 (Week 2): Pt will demonstrate functional problem solving for basic and familiar tasks with Mod A multimodal cues.   Weekly Progress Updates: Patient has made minimal functional gains and has met 1 of 6 short term goals this reporting period due poor awareness and participation.  Patient has declined therapy and missed minutes due to refusal to participate.  She has decreased pace of self-feeding which in turn has reduce her overt s/s of aspiration with PO intake.  However, she has made little progress toward meeting her cognition goals.  Given severity of cognitive deficits, skilled therapy is recommended and family education is required given need for 24/7 supervision at discharge.     Intensity: Minumum of 1-2 x/day, 30 to 90 minutes Frequency: 3 out of 7 days Duration/Length of Stay: 10-12 days Treatment/Interventions: Cognitive remediation/compensation;Cueing hierarchy;Environmental controls;Internal/external aids;Speech/Language facilitation;Patient/family education;Therapeutic Activities;Functional tasks;Dysphagia/aspiration precaution training   Carmelia Roller., [place]   Mora Appl 09/03/2013, 5:13 PM

## 2013-09-03 NOTE — Progress Notes (Signed)
Physical Therapy Weekly Progress Note  Patient Details  Name: [name] MRN: [id number] Date of Birth: [birth year]  Beginning of progress report period: Aug 27, 2013 End of progress report period: September 03, 2013  Today's Date: 09/03/2013  Pt has met 1/5 STGs due to very limited and inconsistent participation in therapy. When participating, pt demonstrates poor tolerance to tx session due to vestibular issues as pt immediately seeks to lie down after any standing mobility. Pt can req max encouragement to participate as well as requires consistent education regarding benefit of therapies to address impairments. Pt currently requires supervision for bed mobility and w/c propulsion, but min-max A for standing balance, SPTs and ambulation up to 75' w/ use of RW due to dizziness, impulsivity, decreased balance and decreased coordination.    Patient continues to demonstrate the following deficits: decreased functional endurance, decreased safety awareness, decreased emergent awareness, decreased sustained attention, decreased standing balance decreased fine/gross coordination, impaired timing/sequencing, impulsivity, vestibular involvement of central origin, safety during overall mobility and therefore will continue to benefit from skilled PT intervention to enhance overall performance with activity tolerance, balance, postural control, ability to compensate for deficits, attention, awareness and coordination.  Patient progressing toward long term goals.  Continue plan of care.  PT Short Term Goals Week 1:  PT Short Term Goal 1 (Week 1): Pt to perform bed mobility with supervision and cues <25% of time for safety PT Short Term Goal 1 - Progress (Week 1): Met PT Short Term Goal 2 (Week 1): Pt to perform SPT bed<>w/c with min A and without cues to utilize visual targeting PT Short Term Goal 2 - Progress (Week 1): Progressing toward goal PT Short Term Goal 3 (Week 1): Pt to propel w/c 250' w/ B UE and  with cues <25% of time to utilize visual targeting PT Short Term Goal 3 - Progress (Week 1): Progressing toward goal PT Short Term Goal 4 (Week 1): Pt to maintain dynamic standing balance during functional tasks with LRAD and min A PT Short Term Goal 4 - Progress (Week 1): Progressing toward goal PT Short Term Goal 5 (Week 1): Pt to ambulate 150' w/ LRAD, min A and cues <25% of time to utilize visual targeting PT Short Term Goal 5 - Progress (Week 1): Progressing toward goal Week 2:  PT Short Term Goal 1 (Week 2): STGs=LTGs due to anticipated LOS  Therapy Documentation Precautions:  Precautions Precautions: Fall Precaution Comments: impulsive, decreased spatial awareness Restrictions Weight Bearing Restrictions: No  See FIM for current functional status  Therapy/Group: Individual Therapy  Gilmore Laroche 09/03/2013, 10:01 AM

## 2013-09-03 NOTE — Progress Notes (Signed)
Physical Therapy Note  Patient Details  Name: [name] MRN: [id number] Date of Birth: Jul 15, 1955 Today's Date: 09/03/2013  Pt missing 30 minutes of skilled physical therapy secondary to pt-reported nausea. Despite max coaxing, pt unwilling to attempt to sit EOB. RN notified of missed time and nausea.  Benjie Karvonen A Ailen Strauch 09/03/2013, 3:15 PM

## 2013-09-03 NOTE — Progress Notes (Signed)
Nursing Note: pt up to bathroom x2 and unable to void. A: pt in and out cathed for 175 cc.Urine was dark ,amber almost pyridium colored with malodor.Pt tolerated well.Fluids pushed earlier only able to get in 240cc.wbb

## 2013-09-03 NOTE — Progress Notes (Signed)
Speech Language Pathology Daily Session Note  Patient Details  Name: [name] MRN: [id number] Date of Birth: 11/29/1955  Today's Date: 09/03/2013 Time: 4193-7902 Time Calculation (min): 45 min  Short Term Goals: Week 1: SLP Short Term Goal 1 (Week 1): Pt will consume current diet with Mod A verbal cues to minimize overt s/s of aspiration.  SLP Short Term Goal 2 (Week 1): Pt will utilize a slow rate of speech to increase speech intelligibility with Min multimodal cues.  SLP Short Term Goal 3 (Week 1): Pt will demonstrate sustained attention to a functional task for 15 minutes with Mod A verbal cues for redirection. SLP Short Term Goal 4 (Week 1): Pt will identify 2 cognitive impairments with Mod multimodal cues.  SLP Short Term Goal 5 (Week 1): Pt will utilize call bell to request assistance with Min multimodal cues.  SLP Short Term Goal 6 (Week 1): Pt will demonstrate functional problem solving for basic and familiar tasks with Mod A multimodal cues.   Skilled Therapeutic Interventions: Treatment focused on speech inteligibility as pt. completed breakfast meal with tech.  She required max-total verbal encouragement  to participate in therapy ("I'm sleepy").  SLP initially demonstrated speech intelligibilty strategies emphasizing slow rate and over articulation early in session.  Spontaneous verbalizations in conversation were approximately 35-40% intelligible with frequent and maximum cues/reminders/examples needed to decrease rate and increase precision.  She stated 2 deficits for intellectual awareness with modified independence and problem solved using menu needing max assist.  Pt.'s impulsivity, decreased sustained attention and and poor ability to self monitor appear to greatly impact ability.       FIM:  Comprehension Comprehension Mode: Auditory Comprehension: 5-Understands basic 90% of the time/requires cueing < 10% of the time Expression Expression Mode: Verbal Expression:  1-Expresses basis less than 25% of the time/requires cueing greater than 75% of the time. Social Interaction Social Interaction: 2-Interacts appropriately 25 - 49% of time - Needs frequent redirection. Problem Solving Problem Solving: 3-Solves basic 50 - 74% of the time/requires cueing 25 - 49% of the time Memory Memory: 3-Recognizes or recalls 50 - 74% of the time/requires cueing 25 - 49% of the time  Pain Pain Assessment Pain Assessment: No/denies pain  Therapy/Group: Individual Therapy  Orbie Pyo [facility] 09/03/2013, 12:35 PM

## 2013-09-03 NOTE — Progress Notes (Signed)
Physical Therapy Session Note  Patient Details  Name: [name] MRN: [id number] Date of Birth: 11/01/1955  Today's Date: 09/03/2013  Skilled Therapeutic Interventions/Progress Updates:  Pt received semi-reclined in bed with blankets over face, declined participation in scheduled 42min skilled physical therapy session. Therapist providing max encouragement for participation, but pt declining "No, I'm ok." When therapist attempted to pull covers back, pt pulling them back over head saying, "bye." Pt left semi-reclined in bed, bed alarm on and all needs in reach.   Therapy Documentation Precautions:  Precautions Precautions: Fall Precaution Comments: impulsive, decreased spatial awareness Restrictions Weight Bearing Restrictions: No General: Amount of Missed PT Time (min): 45 Minutes Missed Time Reason: Patient unwilling/refused to participate without medical reason  See FIM for current functional status  Therapy/Group: Individual Therapy  Gilmore Laroche 09/03/2013, 9:54 AM

## 2013-09-03 NOTE — Progress Notes (Signed)
Occupational Therapy Session Note  Patient Details  Name: [name] MRN: [id number] Date of Birth: 1955-10-13  Today's Date: 09/03/2013  Session 1 Time: 0700-0725 Time Calculation (min): 25 min  Short Term Goals: Week 2:  OT Short Term Goal 1 (Week 2): STG=LTG  Skilled Therapeutic Interventions/Progress Updates:    Pt asleep in bed upon arrival and easily aroused.  Pt agreeable to shower this morning.  Pt amb with weighted RW to bathroom.  Pt initially required min A for ambulation but experienced LOB X 1 requiring max A to correct balance.  Pt made no attempt to self correct LOB.  Pt completed all bathing tasks seated with steady A when standing to bathe buttocks.  Pt completed dressing tasks in bathroom with steady A when standing to pull up pants.  Pt declined donning adult brief and nurse tech notified and advised to check on patient frequently.  Pt returned to bed upon completion of BADLs.  Pt adamant that she wasn't going to do anything else.  Pt's continues to exhibit decreased gross and fine motor control with bathing and dressing tasks. Focus on active participation, safety awareness, transfers, functional amb with RW, and dynamic standing balance.  Therapy Documentation Precautions:  Precautions Precautions: Fall Precaution Comments: impulsive, decreased spatial awareness Restrictions Weight Bearing Restrictions: No General: General Amount of Missed OT Time (min): 35 Minutes Pain: Pain Assessment Pain Assessment: No/denies pain See FIM for current functional status  Therapy/Group: Individual Therapy  Session 2 Time: 7628-3151 (pt missed 10 mins skilled OT services) Pt denied pain Individual Therapy  Pt resting in bed upon arrival but initially agreeable to participating in therapy.  Pt initially engaged in funcitonal amb with RW in room for home mgmt tasks but stated that she wanted to walk in hallway.  Pt amb with RW from room to nurses station and returned to  room. Pt required min A for ambulation this afternoon with no LOB. I encouraged patient to walk into day room but patient was adamant on returning to room and started resisting walking any further away from room.  Pt sat EOB after entering room and immediately laid back into bed stating "okay done". Focus on active participation, functional amb with RW for home mgmt tasks, and safety awareness.  Leroy Libman 09/03/2013, 7:35 AM

## 2013-09-04 ENCOUNTER — Inpatient Hospital Stay (HOSPITAL_COMMUNITY): Payer: No Typology Code available for payment source | Admitting: Physical Therapy

## 2013-09-04 DIAGNOSIS — I619 Nontraumatic intracerebral hemorrhage, unspecified: Secondary | ICD-10-CM

## 2013-09-04 NOTE — Progress Notes (Signed)
Patient bladder scanned at 0845 as no void since last in and out cath at 2350. Bladder scan volume showed only 164ml of urine. Patient refusing to drink any fluids as she states "she is not thirsty." Will continue to encourage fluids and re assess bladder volume in the next 2 hours.

## 2013-09-04 NOTE — Progress Notes (Signed)
Physical Therapy Session Note  Patient Details  Name: [name] MRN: [id number] Date of Birth: May 21, 1955  Today's Date: 09/04/2013 Time: 1110-1140 Time Calculation (min): 30 min  Short Term Goals: Week 2:  PT Short Term Goal 1 (Week 2): STGs=LTGs due to anticipated LOS  Skilled Therapeutic Interventions/Progress Updates:    Pt received semi-reclined in bed; agreeable to therapy with min coaxing. Session focused on increasing pt tolerance to functional head turning, decreasing motion sensitivity. Performed supine>sit with supervision, HOB elevated using bed rail. Stand pivot from bed<>w/c with min A, R HHA.W/c mobility x150' in controlled environment with bilat UE's with supervision.   In gym, pt played checkers on large board with RUE support at table for functional head turning (turning head to L side to pick up checkers) and gaze stabilization. Longest standing trial = 5 minutes, 45 seconds without rest break. Pt with onset of RUE ataxia during final 30 seconds of trial. Repositioned pt into seated after diaphoresis and eye watering noted. Pt reporting significant nausea, requesting to lay down. No vomiting. See below for vital sign readings taken at this time. Transported pt to room win w/c with Total A secondary to pt nausea. RN notified of nausea. Therapist departed with pt semi reclined in bed with 3 bed rails up, bed alarm on; pt in no apparent distress.  Therapy Documentation Precautions:  Precautions Precautions: Fall Precaution Comments: impulsive, decreased spatial awareness Restrictions Weight Bearing Restrictions: No General: Amount of Missed PT Time (min): 30 Minutes Missed Time Reason: Patient ill (comment);Nursing care (Missed initial 10 minutes due to nursing care; final 20 minutes due to nausea) Vital Signs: Therapy Vitals Pulse Rate: 64 BP: 117/82 mmHg Patient Position (if appropriate): Sitting (Pt reporting nausea immediately after standing, head  turns) Oxygen Therapy SpO2: 100 % O2 Device: None (Room air) Pain: Pain Assessment Pain Assessment: No/denies pain Pain Score: 0-No pain Locomotion : Wheelchair Mobility Distance: 150   See FIM for current functional status  Therapy/Group: Individual Therapy  Benjie Karvonen A [place] 09/04/2013, 12:06 PM

## 2013-09-04 NOTE — Progress Notes (Signed)
Patient ID: [name], female   DOB: 1955-12-11, 58 y.o.   MRN: [id number]    [facility]     PROGRESS NOTE    09/04/13.  Subjective/Complaints:  58 year old patient admitted to rehabilitation  with functional deficits secondary to Right paracentral cerebellar ICH  No new issues. " I want to go home" ROS limited due to cognition/behavior  Past Medical History  Diagnosis Date  . Hepatitis C   . Pneumonia   . Hypertension   . Diabetes mellitus without complication      Objective: Vital Signs: Blood pressure 106/72, pulse 68, temperature 97.9 F (36.6 C), temperature source Oral, resp. rate 18, height 5\' 6"  (1.676 m), weight 185 lb 3 oz (84 kg), SpO2 97.00%. No results found. No results found for this basename: WBC, HGB, HCT, PLT,  in the last 72 hours No results found for this basename: NA, K, CL, CO, GLUCOSE, BUN, CREATININE, CALCIUM,  in the last 72 hours CBG (last 3)   Recent Labs  09/02/13 0738 09/02/13 1121 09/02/13 1621  GLUCAP 103* 127* 84    Wt Readings from Last 3 Encounters:  08/26/13 185 lb 3 oz (84 kg)  08/23/13 185 lb 6.4 oz (84.097 kg)  08/23/13 185 lb 6.4 oz (84.097 kg)    Intake/Output Summary (Last 24 hours) at 09/04/13 0942 Last data filed at 09/03/13 2345  Gross per 24 hour  Intake    120 ml  Output    301 ml  Net   -181 ml    Patient Vitals for the past 24 hrs:  BP Temp Temp src Pulse Resp SpO2  09/04/13 0503 106/72 mmHg 97.9 F (36.6 C) Oral 68 18 97 %  09/03/13 2108 109/72 mmHg 97.7 F (36.5 C) Oral 60 17 96 %  09/03/13 2040 90/60 mmHg - - 58 - -  09/03/13 1328 106/67 mmHg 98.3 F (36.8 C) Oral 59 17 97 %     Physical Exam:   Constitutional: She is oriented to person, place, and time. She appears well-developed and well-nourished.  HENT:  Head: Normocephalic and atraumatic.  adentulous  Eyes: Conjunctivae are normal. Pupils are equal, round, and reactive to light.Neck: Normal range of  motion. Neck supple.  Cardiovascular: Normal rate and regular rhythm.  Respiratory: Effort normal and breath sounds normal.  GI: Soft. Bowel sounds are normal. She exhibits no distension. There is no tenderness.   Musculoskeletal: She exhibits no edema and no tenderness.  Neurological: She is alert and oriented to person, place, and time.    She is able to follow basic commands without difficulty. She continues to display moderate to severe dysarthria. Severe dysmetria bilateral finger nose finger, mod dymetria R HS. Impulsive with poor attention and impaired insight   Motor is 5/5 in bilateral delt, bi, tri , grip, HF, KE, ADF  Psych: flat. Still often keeps eyes closed  Assessment/Plan: 1. Functional deficits secondary to right paracentral cerebellar ICH which require 3+ hours per day of interdisciplinary therapy in a comprehensive inpatient rehab setting. 2. DVT Prophylaxis/Anticoagulation: Mechanical: Antiembolism stockings, knee (TED hose) Bilateral lower extremities  Sequential compression devices, below knee Bilateral lower extremities  3. Pain Management: Will continue vicodin prn.  4. H/o depression/Mood: prozac resumed  -neuropsych requested  -pt flat, withdrawn, not engaging.  5. Neuropsych: This patient is not capable of making decisions on her own behalf.  6. HTN: Will monitor with checks every 8 hours and titrate medications as indicated. Continue lopressor  bid.     LOS (Days) 9 A FACE TO FACE EVALUATION WAS PERFORMED  Marletta Lor 09/04/2013 9:41 AM

## 2013-09-04 NOTE — Progress Notes (Signed)
Bladder scan performed after 8 hours of no spontaneous void. Showed greater than 272ml of urine. Patient complained of bladder being uncomfortable and feeling full. Bladder was clearly distended. In and out cath performed and 36ml of output dark amber urine. Will continue to monitor.

## 2013-09-04 NOTE — Progress Notes (Signed)
Bladder scan performed again. Only 11ml of urine shown in scan. Fluids encouraged and patient drank 234ml of juice. She complains of no discomfort or the void to urge. Will continue to monitor.

## 2013-09-05 ENCOUNTER — Inpatient Hospital Stay (HOSPITAL_COMMUNITY): Payer: No Typology Code available for payment source

## 2013-09-05 MED ORDER — AMOXICILLIN 500 MG PO CAPS
500.0000 mg | ORAL_CAPSULE | Freq: Three times a day (TID) | ORAL | Status: DC
  Administered 2013-09-05 – 2013-09-07 (×8): 500 mg via ORAL
  Filled 2013-09-05 (×10): qty 1

## 2013-09-05 NOTE — Progress Notes (Signed)
At 0200, no void in 8 hours, I & O cath=250cc's. Urine amber and malodorous. Drank 120cc's so far since 2000. Poor PO intake. Speech garbled at times. Cornell Barman

## 2013-09-05 NOTE — Progress Notes (Signed)
Occupational Therapy Session Note  Patient Details  Name: [name] MRN: [id number] Date of Birth: 06-Dec-1955  Today's Date: 09/05/2013 Time: 1100-1127 Time Calculation (min): 27 min  Short Term Goals: Week 2:  OT Short Term Goal 1 (Week 2): STG=LTG  Skilled Therapeutic Interventions/Progress Updates:    Pt asleep in bed upon arrival but easily aroused.  Pt initially declined participating in therapy but finally agreed to take a shower.  Pt requires min/mod A when ambulating with RW and exhibited LOB X 3 with no righting reactions evident.  Pt completed all bathing and dressing tasks with steady A only when standing.  Upon completion of dressing tasks patient laid back into bed and stated "Okay.  Done.  Leave." Focus on activity tolerance, functional amb with RW, standing balance, and safety awareness.  Therapy Documentation Precautions:  Precautions Precautions: Fall Precaution Comments: impulsive, decreased spatial awareness Restrictions Weight Bearing Restrictions: No    See FIM for current functional status  Therapy/Group: Individual Therapy  Leroy Libman 09/05/2013, 11:28 AM

## 2013-09-05 NOTE — Progress Notes (Signed)
Patient ID: [name], female   DOB: 10/18/1955, 58 y.o.   MRN: [id number] Patient ID: [name], female   DOB: Jan 09, 1956, 58 y.o.   MRN: [id number]    [facility]     PROGRESS NOTE    09/05/13.  Subjective/Complaints:  58 year old patient admitted to rehabilitation  with functional deficits secondary to Right paracentral cerebellar ICH  No new issues. " I want to go home" ROS limited due to cognition/behavior  Urine described as dark with strong odor.  By mouth intake Marginal.  Remains afebrile.  Urine culture from 5/29 revealed enterococcal species.  Unclear if patient treated for UTI.  Past Medical History  Diagnosis Date  . Hepatitis C   . Pneumonia   . Hypertension   . Diabetes mellitus without complication      Objective: Vital Signs: Blood pressure 114/72, pulse 76, temperature 97.3 F (36.3 C), temperature source Oral, resp. rate 18, height 5\' 6"  (1.676 m), weight 185 lb 3 oz (84 kg), SpO2 96.00%. No results found. No results found for this basename: WBC, HGB, HCT, PLT,  in the last 72 hours No results found for this basename: NA, K, CL, CO, GLUCOSE, BUN, CREATININE, CALCIUM,  in the last 72 hours CBG (last 3)   Recent Labs  09/02/13 1121 09/02/13 1621  GLUCAP 127* 84    Wt Readings from Last 3 Encounters:  08/26/13 185 lb 3 oz (84 kg)  08/23/13 185 lb 6.4 oz (84.097 kg)  08/23/13 185 lb 6.4 oz (84.097 kg)    Intake/Output Summary (Last 24 hours) at 09/05/13 0824 Last data filed at 09/05/13 0200  Gross per 24 hour  Intake    330 ml  Output    650 ml  Net   -320 ml    Patient Vitals for the past 24 hrs:  BP Temp Temp src Pulse Resp SpO2  09/05/13 0505 114/72 mmHg 97.3 F (36.3 C) Oral 76 18 96 %  09/04/13 1956 112/73 mmHg 97.1 F (36.2 C) Oral 60 16 98 %  09/04/13 1357 110/74 mmHg 98 F (36.7 C) Oral 59 18 99 %  09/04/13 1138 117/82 mmHg - - 64 - 100 %     Physical Exam:   Constitutional: She  is oriented to person, place, and time. She appears well-developed and well-nourished.  HENT:  Head: Normocephalic and atraumatic.  adentulous  Eyes: Conjunctivae are normal. Pupils are equal, round, and reactive to light.Neck: Normal range of motion. Neck supple.  Cardiovascular: Normal rate and regular rhythm.  Respiratory: Effort normal and breath sounds normal.  GI: Soft. Bowel sounds are normal. She exhibits no distension. There is no tenderness.   Musculoskeletal: She exhibits no edema and no tenderness.  Neurological: She is alert and oriented to person, place, and time.    She is able to follow basic commands without difficulty. She continues to display moderate to severe dysarthria. Severe dysmetria bilateral finger nose finger, mod dymetria R HS. Impulsive with poor attention and impaired insight   Motor is 5/5 in bilateral delt, bi, tri , grip, HF, KE, ADF  Psych: flat. Still often keeps eyes closed  Assessment/Plan: 1. Functional deficits secondary to right paracentral cerebellar ICH which require 3+ hours per day of interdisciplinary therapy in a comprehensive inpatient rehab setting. 2. DVT Prophylaxis/Anticoagulation: Mechanical: Antiembolism stockings, knee (TED hose) Bilateral lower extremities  Sequential compression devices, below knee Bilateral lower extremities  3. Pain Management: Will continue vicodin prn.  4.  H/o depression/Mood: prozac resumed  -neuropsych requested  -pt flat, withdrawn, not engaging.  5. Neuropsych: This patient is not capable of making decisions on her own behalf.  6. HTN: Will monitor with checks every 8 hours and titrate medications as indicated. Continue lopressor bid.  7.  history of enterococcal UTI.  Repeat urine C&S and start on amoxicillin pending results    LOS (Days) [place] PERFORMED  Marletta Lor 09/05/2013 8:24 AM

## 2013-09-06 ENCOUNTER — Inpatient Hospital Stay (HOSPITAL_COMMUNITY): Payer: Medicaid Other

## 2013-09-06 ENCOUNTER — Inpatient Hospital Stay (HOSPITAL_COMMUNITY): Payer: No Typology Code available for payment source

## 2013-09-06 ENCOUNTER — Encounter (HOSPITAL_COMMUNITY): Payer: No Typology Code available for payment source

## 2013-09-06 LAB — BASIC METABOLIC PANEL
BUN: 10 mg/dL (ref 6–23)
CHLORIDE: 107 meq/L (ref 96–112)
CO2: 25 mEq/L (ref 19–32)
Calcium: 9.5 mg/dL (ref 8.4–10.5)
Creatinine, Ser: 0.66 mg/dL (ref 0.50–1.10)
Glucose, Bld: 92 mg/dL (ref 70–99)
Potassium: 4.4 mEq/L (ref 3.7–5.3)
SODIUM: 142 meq/L (ref 137–147)

## 2013-09-06 NOTE — Progress Notes (Signed)
Up to void at 1900, bladder scan=11cc's. Requested and given PRN vicodin 1 tab and trazodone. Slept good during night. Up to void at midnight. At 0700 bladder scan 220. Continent BM at HS. Used call bell for assistance. Cornell Barman

## 2013-09-06 NOTE — Progress Notes (Signed)
Both RN and NT have encouraged patient to increase PO intake of both food and liquids.  Patient refused breakfast and only ate a few bites of lunch.  Patient will not state why she is eating and drinking poorly; patient wants to be left alone to sleep.  Mar[place]e [place], PA notified of poor PO intake, inadequate fluids, and low volumes when the patient is in and out cathed.  No new orders received; will continue to monitor.

## 2013-09-06 NOTE — Progress Notes (Signed)
[facility] MEDICINE & REHABILITATION     PROGRESS NOTE    Subjective/Complaints: Up in bed. "i'm hungry"  ROS still limited due to cognition/behavior  Objective: Vital Signs: Blood pressure 102/61, pulse 60, temperature 98.3 F (36.8 C), temperature source Oral, resp. rate 18, height 5\' 6"  (1.676 m), weight 84 kg (185 lb 3 oz), SpO2 96.00%. No results found. No results found for this basename: WBC, HGB, HCT, PLT,  in the last 72 hours  Recent Labs  09/06/13 0523  NA 142  K 4.4  CL 107  GLUCOSE 92  BUN 10  CREATININE 0.66  CALCIUM 9.5   CBG (last 3)  No results found for this basename: GLUCAP,  in the last 72 hours  Wt Readings from Last 3 Encounters:  08/26/13 84 kg (185 lb 3 oz)  08/23/13 84.097 kg (185 lb 6.4 oz)  08/23/13 84.097 kg (185 lb 6.4 oz)    Physical Exam:   Constitutional: She is oriented to person, place, and time. She appears well-developed and well-nourished.  HENT:  Head: Normocephalic and atraumatic.  adentulous  Eyes: Conjunctivae are normal. Pupils are equal, round, and reactive to light. Right eye exhibits no discharge and no exudate. Left eye exhibits no discharge and no exudate. Right conjunctiva is not injected. Right eye exhibits nystagmus. Left eye exhibits nystagmus.  Neck: Normal range of motion. Neck supple.  Cardiovascular: Normal rate and regular rhythm.  Respiratory: Effort normal and breath sounds normal.  GI: Soft. Bowel sounds are normal. She exhibits no distension. There is no tenderness.   Musculoskeletal: She exhibits no edema and no tenderness.  Neurological: She is alert and oriented to person, place, and time.    She is able to follow basic commands without difficulty. She continues to display moderate to severe dysarthria. Severe dysmetria bilateral finger nose finger, mod dymetria R Heel to [place]. Impulsive with poor attention and impaired insight   Motor is 5/5 in bilateral delt, bi, tri , grip, HF, KE, ADF Skin:  rash right shoulder resolved Psych: flat. Still often keeps eyes closed  Assessment/Plan: 1. Functional deficits secondary to right paracentral cerebellar ICH which require 3+ hours per day of interdisciplinary therapy in a comprehensive inpatient rehab setting. Physiatrist is providing close team supervision and 24 hour management of active medical problems listed below. Physiatrist and rehab team continue to assess barriers to discharge/monitor patient progress toward functional and medical goals.    snf placement pending  FIM: FIM - Bathing Bathing Steps Patient Completed: Chest;Right Arm;Left Arm;Abdomen;Front perineal area;Buttocks;Left lower leg (including foot);Right lower leg (including foot);Left upper leg;Right upper leg Bathing: 5: Supervision: Safety issues/verbal cues  FIM - Upper Body Dressing/Undressing Upper body dressing/undressing steps patient completed: Thread/unthread right sleeve of pullover shirt/dresss;Pull shirt over trunk;Thread/unthread right sleeve of front closure shirt/dress;Thread/unthread left sleeve of front closure shirt/dress Upper body dressing/undressing: 5: Supervision: Safety issues/verbal cues FIM - Lower Body Dressing/Undressing Lower body dressing/undressing steps patient completed: Thread/unthread right pants leg;Thread/unthread left pants leg;Pull pants up/down;Don/Doff right sock;Don/Doff left sock;Pull underwear up/down Lower body dressing/undressing: 5: Supervision: Safety issues/verbal cues  FIM - Toileting Toileting steps completed by patient: Adjust clothing prior to toileting;Performs perineal hygiene;Adjust clothing after toileting Toileting: 4: Assist with fasteners  FIM - Radio producer Devices: Grab bars;Walker Toilet Transfers: 4-From toilet/BSC: Min A (steadying Pt. > 75%);4-To toilet/BSC: Min A (steadying Pt. > 75%)  FIM - Bed/Chair Transfer Bed/Chair Transfer Assistive Devices: Bed rails;HOB  elevated;Arm rests;Walker Bed/Chair Transfer: 5: Supine >  Sit: Supervision (verbal cues/safety issues);5: Sit > Supine: Supervision (verbal cues/safety issues);4: Bed > Chair or W/C: Min A (steadying Pt. > 75%);4: Chair or W/C > Bed: Min A (steadying Pt. > 75%)  FIM - Locomotion: Wheelchair Distance: 150 Locomotion: Wheelchair: 5: Travels 150 ft or more: maneuvers on rugs and over door sills with supervision, cueing or coaxing FIM - Locomotion: Ambulation Locomotion: Ambulation Assistive Devices: Administrator Ambulation/Gait Assistance: 3: Mod assist Locomotion: Ambulation: 0: Activity did not occur  Comprehension Comprehension Mode: Auditory Comprehension: 5-Understands basic 90% of the time/requires cueing < 10% of the time  Expression Expression Mode: Verbal Expression: 3-Expresses basic 50 - 74% of the time/requires cueing 25 - 50% of the time. Needs to repeat parts of sentences.  Social Interaction Social Interaction Mode: Asleep Social Interaction: 4-Interacts appropriately 75 - 89% of the time - Needs redirection for appropriate language or to initiate interaction.  Problem Solving Problem Solving Mode: Asleep Problem Solving: 3-Solves basic 50 - 74% of the time/requires cueing 25 - 49% of the time  Memory Memory Mode: Asleep Memory: 2-Recognizes or recalls 25 - 49% of the time/requires cueing 51 - 75% of the time   Medical Problem List and Plan:  1. Functional deficits secondary to Right paracentral cerebellar ICH  2. DVT Prophylaxis/Anticoagulation: Mechanical: Antiembolism stockings, knee (TED hose) Bilateral lower extremities  Sequential compression devices, below knee Bilateral lower extremities  3. Pain Management: Will continue vicodin prn.  4. H/o depression/Mood: prozac resumed  -neuropsych requested  -pt flat, withdrawn, not engaging.  5. Neuropsych: This patient is not capable of making decisions on her own behalf.  6. HTN: Will monitor with checks every  8 hours and titrate medications as indicated. Continue lopressor bid.  7. Hep C: Not a candidate for treatment due to ongoing drug abuse.  8. H/o gastritis:   Pepcid.  . Encouraging intake 9. H/o Knee pain: Added voltaren gel if needed..  10. Abnormal LFTs: still elevated. ?hep c and stress related.  11. Hypokelmia:  Labs normal  12. Thrombocytopenia: Will continue to monitor platelets as well as any signs of bleeding. Trending up 13. Recent diffuse rash:   resolved.       LOS (Days) 11 A FACE TO FACE EVALUATION WAS PERFORMED  Meredith Staggers 09/06/2013 8:23 AM

## 2013-09-06 NOTE — Progress Notes (Addendum)
Physical Therapy Session Note  Patient Details  Name: [name] MRN: [id number] Date of Birth: December 25, 1955  Today's Date: 09/06/2013 Time: 1000-1025 Time Calculation (min): 25 min  Short Term Goals: Week 2:  PT Short Term Goal 1 (Week 2): STGs=LTGs due to anticipated LOS  Skilled Therapeutic Interventions/Progress Updates:  1:1. Pt received supine in bed w/ blanket over head, req multimodal cues to look at therapist and mod encouragement to participate in therapy. Pt propelled w/c room<>therapy gym w/ overall supervision. Focus on tolerance to standing mobility including ambulation and stair negotiation. Pt req min A for negotiation up/down 4 steps w/ B rails and 4 steps, sideways w/ single rail, utilizing step to pattern w/out cueing. Pt amb initial 100'x2 w/ min A due to decreased impulsivity and good sustained attention to task, however, as traffic in hallway became busier and req more turns pt demonstrating poor frustration tolerance and increased impulsivity req mod A for 50'x2. Pt became extremely frustrated yelling out in gym, but unintelligible. Pt immediately lied down on tx mat and put pillow over face and would not respond to therapist. With time to calm down, pt then able to more clearly explain that L turns make her dizzy, that she was not feeling well and persistent about returning to room. Unable to encourage further participation in session. Pt propelled self back to room and assisted back to bed. Pt supine in bed w/ all needs in reach and bed alarm on.    Therapy Documentation Precautions:  Precautions Precautions: Fall Precaution Comments: impulsive, decreased spatial awareness Restrictions Weight Bearing Restrictions: No General: Amount of Missed PT Time (min): 35 Minutes Missed Time Reason: Patient unwilling/refused to participate without medical reason  See FIM for current functional status  Therapy/Group: Individual Therapy  Gilmore Laroche 09/06/2013, 10:32  AM

## 2013-09-06 NOTE — Progress Notes (Signed)
Speech Language Pathology Daily Session Note  Patient Details  Name: [name] MRN: [id number] Date of Birth: September 21, 1955  Today's Date: 09/06/2013 Time: 0867-6195 Time Calculation (min): 45 min  Short Term Goals: Week 2: SLP Short Term Goal 1 (Week 2): Pt will consume current diet with Min A verbal cues to minimize overt s/s of aspiration.  SLP Short Term Goal 2 (Week 2): Pt will utilize a slow rate of speech to increase speech intelligibility with Mod multimodal cues.  SLP Short Term Goal 3 (Week 2): Pt will demonstrate sustained attention to a functional task for 10 minutes with Mod A verbal cues for redirection. SLP Short Term Goal 4 (Week 2): Pt will identify 2 cognitive impairments with Max multimodal cues.  SLP Short Term Goal 5 (Week 2): Pt will utilize call bell to request assistance with Mod multimodal cues.  SLP Short Term Goal 6 (Week 2): Pt will demonstrate functional problem solving for basic and familiar tasks with Mod A multimodal cues.   Skilled Therapeutic Interventions: Treatment focused on speech, swallowing, and cognitive goals. Pt required Max verbal encouragement to participate in therapy. SLP facilitated session with Max cues for utilization of speech intelligibility strategies (particularly a slow rate) to increase intelligibility. Pt consumed Dys 1 textures and nectar thick liquids from meal tray with no overt s/s of aspiration with Mod cues for use of safe swallowing strategies. Pt required Max cues for functional use of schedule including basic time calculations, due in large part to impulsivity. Pt was assisted to/from the bathroom with Max-Total A for safety awareness. Continue plan of care.   FIM:  Comprehension Comprehension Mode: Auditory Comprehension: 5-Understands basic 90% of the time/requires cueing < 10% of the time Expression Expression Mode: Verbal Expression: 2-Expresses basic 25 - 49% of the time/requires cueing 50 - 75% of the time. Uses  single words/gestures. Social Interaction Social Interaction: 2-Interacts appropriately 25 - 49% of time - Needs frequent redirection. Problem Solving Problem Solving: 2-Solves basic 25 - 49% of the time - needs direction more than half the time to initiate, plan or complete simple activities Memory Memory: 4-Recognizes or recalls 75 - 89% of the time/requires cueing 10 - 24% of the time FIM - Eating Eating Activity: 5: Needs verbal cues/supervision  Pain Pain Assessment Pain Assessment: No/denies pain Pain Score: 0-No pain  Therapy/Group: Individual Therapy   Germain Osgood, M.A. CCC-SLP [phone]  Germain Osgood 09/06/2013, 11:39 AM

## 2013-09-06 NOTE — Progress Notes (Signed)
Physical Therapy Session Note  Patient Details  Name: [name] MRN: [id number] Date of Birth: [birth year]  Today's Date: 09/06/2013 Time: 4665-9935 Time Calculation (min): 30 min  Short Term Goals: Week 2:  PT Short Term Goal 1 (Week 2): STGs=LTGs due to anticipated LOS  Skilled Therapeutic Interventions/Progress Updates:  1:1. Pt received supine in bed, resting w/ family in room. Pt req min encouragement for participation in session. Focus this session on safety during functional mobility and NMR in standing. With consistent cues to decrease speed of movement, pt able to demonstrate safer t/f sup<>sit w/ supervision, squat pivot t/f bed>w/c w/ min A and ambulation x150' w/ RW and min A overall. Pt propelled w/c room>therapy gym w/ overall (S) and good speed.   Pt engaged in 3 rounds of horseshoes, reaching 2x to L side w/ L UE and 1x to R side reaching across midline w/ L UE for emphasis on dynamic standing balance and habituation to L head turns. Pt req min-mod A for overall balance w/ R UE support on RW during task but again requiring consistent cueing to decrease speed of movement. Pt answering, "yes" to decreased dizziness with slower movements. Pt attempting to compensate by not turning to look for horseshoes and just reaching to find with hand, but encouragement to look.   Pt supine in bed at end of session w/ all needs in reach, bed alarm on.   Therapy Documentation Precautions:  Precautions Precautions: Fall Precaution Comments: impulsive, decreased spatial awareness Restrictions Weight Bearing Restrictions: No  See FIM for current functional status  Therapy/Group: Individual Therapy  Gilmore Laroche 09/06/2013, 2:46 PM

## 2013-09-06 NOTE — Progress Notes (Signed)
Occupational Therapy Session Note  Patient Details  Name: [name] MRN: [id number] Date of Birth: 1956/02/13  Today's Date: 09/06/2013 Time: 0700-0747 Time Calculation (min): 47 min  Short Term Goals: Week 2:  OT Short Term Goal 1 (Week 2): STG=LTG  Skilled Therapeutic Interventions/Progress Updates:    Pt asleep in bed upon arrival but easily aroused and initially agreeable to engaging in therapy.  Pt wanted to shower this morning and immediately sat EOB.  Pt amb with RW to bathroom initially requiring min A but exhibited LOB X 2 requiring max A to correct.  Pt did not attempt to correct LOB.  Pt exhibited ataxic BUE movements during bathing in shower.  Pt completed bathing tasks while seated incorporating lateral leans to bathe buttocks.  Pt completed dressing tasks with sit<>stand from tub bench using grab bars for balance.  Pt returned to room and immediately went to bed and laid down stating "OK. Done. Leave." Pt continues to exhibit decreased safety awareness and requires max encouragement to participate. Focus on activity tolerance, safety awareness, functional amb with RW, and  dynamic standing balance.  Therapy Documentation Precautions:  Precautions Precautions: Fall Precaution Comments: impulsive, decreased spatial awareness Restrictions Weight Bearing Restrictions: No General: General Amount of Missed OT Time (min): 13 Minutes Pain: Pain Assessment Pain Assessment: No/denies pain  See FIM for current functional status  Therapy/Group: Individual Therapy  Leroy Libman 09/06/2013, 7:48 AM

## 2013-09-06 NOTE — Progress Notes (Signed)
Patient has not voided or been cathed for 8 hours.  Bladder scan volume reveals 151 mL.  Patient currently refusing to be in and out cathed.  Marlowe Shores, PA notified and is aware of the patient's refusal; PA advises to leave her alone for now.  Will continue to monitor bladder volumes and encourage patient to attempt to void.

## 2013-09-07 ENCOUNTER — Inpatient Hospital Stay (HOSPITAL_COMMUNITY): Payer: No Typology Code available for payment source

## 2013-09-07 ENCOUNTER — Encounter (HOSPITAL_COMMUNITY): Payer: No Typology Code available for payment source

## 2013-09-07 ENCOUNTER — Inpatient Hospital Stay (HOSPITAL_COMMUNITY): Payer: Medicaid Other | Admitting: Speech Pathology

## 2013-09-07 ENCOUNTER — Inpatient Hospital Stay (HOSPITAL_COMMUNITY): Payer: No Typology Code available for payment source | Admitting: *Deleted

## 2013-09-07 LAB — URINE CULTURE

## 2013-09-07 MED ORDER — SULFAMETHOXAZOLE-TRIMETHOPRIM 400-80 MG PO TABS
1.0000 | ORAL_TABLET | Freq: Two times a day (BID) | ORAL | Status: DC
  Administered 2013-09-07 – 2013-09-10 (×6): 1 via ORAL
  Filled 2013-09-07 (×8): qty 1

## 2013-09-07 MED ORDER — SODIUM CHLORIDE 0.9 % IV SOLN
INTRAVENOUS | Status: DC
  Administered 2013-09-07: 11:00:00 via INTRAVENOUS

## 2013-09-07 MED ORDER — SULFAMETHOXAZOLE-TRIMETHOPRIM 200-40 MG/5ML PO SUSP: 20.0000 mL | Freq: Two times a day (BID) | ORAL | Status: DC

## 2013-09-07 MED ORDER — ENSURE PUDDING PO PUDG
1.0000 | Freq: Three times a day (TID) | ORAL | Status: DC
  Administered 2013-09-08 – 2013-09-10 (×6): 1 via ORAL

## 2013-09-07 MED ORDER — MIRTAZAPINE 7.5 MG PO TABS: 7.5000 mg | ORAL_TABLET | Freq: Every day | ORAL | Status: DC

## 2013-09-07 MED ORDER — SODIUM CHLORIDE 0.9 % IV SOLN: INTRAVENOUS | Status: AC

## 2013-09-07 MED ORDER — ENSURE COMPLETE PO LIQD
237.0000 mL | Freq: Two times a day (BID) | ORAL | Status: DC
  Administered 2013-09-07 – 2013-09-10 (×3): 237 mL via ORAL

## 2013-09-07 NOTE — Progress Notes (Signed)
Physical Therapy Note  Patient Details  Name: [name] MRN: [id number] Date of Birth: 12-04-1955 Today's Date: 09/07/2013  Patient missed 45 minutes skilled physical therapy this AM secondary to refusal to participate. Patient received supine in bed, asleep, but easily awakened. Patient refusing therapy, eventually telling therapist to return in 72 minutes. Therapist returned 5 and then 15 minutes later and patient continues to refuse. Will follow up as able.   Sims Shanae Luo, PT, DPT 09/07/2013, 9:46 AM

## 2013-09-07 NOTE — Progress Notes (Signed)
[facility] PHYSICAL MEDICINE & REHABILITATION     PROGRESS NOTE    Subjective/Complaints: Pt continues to be non-compliant, refuses therapies/interventions ROS still limited due to cognition/behavior  Objective: Vital Signs: Blood pressure 105/70, pulse 60, temperature 98.2 F (36.8 C), temperature source Oral, resp. rate 18, height 5\' 6"  (1.676 m), weight 84 kg (185 lb 3 oz), SpO2 97.00%. No results found. No results found for this basename: WBC, HGB, HCT, PLT,  in the last 72 hours  Recent Labs  09/06/13 0523  NA 142  K 4.4  CL 107  GLUCOSE 92  BUN 10  CREATININE 0.66  CALCIUM 9.5   CBG (last 3)  No results found for this basename: GLUCAP,  in the last 72 hours  Wt Readings from Last 3 Encounters:  08/26/13 84 kg (185 lb 3 oz)  08/23/13 84.097 kg (185 lb 6.4 oz)  08/23/13 84.097 kg (185 lb 6.4 oz)    Physical Exam:   Constitutional:  nondistressed.  HENT:  Head: Normocephalic and atraumatic.  adentulous  Eyes: Conjunctivae are normal. Pupils are equal, round, and reactive to light. Right eye exhibits no discharge and no exudate. Left eye exhibits no discharge and no exudate. Right conjunctiva is not injected. Right eye exhibits nystagmus. Left eye exhibits nystagmus.  Neck: Normal range of motion. Neck supple.  Cardiovascular: Normal rate and regular rhythm.  Respiratory: Effort normal and breath sounds normal.  GI: Soft. Bowel sounds are normal. She exhibits no distension. There is no tenderness.   Musculoskeletal: She exhibits no edema and no tenderness.  Neurological: She is alert and oriented to person, place, and time.    She is able to follow basic commands without difficulty. She continues to display moderate to severe dysarthria. Severe dysmetria bilateral finger nose finger, mod dymetria R Heel to [place]. Impulsive with poor attention and impaired insight   Motor is 5/5 in bilateral delt, bi, tri , grip, HF, KE, ADF Skin: rash right shoulder  resolved Psych: flat. Still often keeps eyes closed  Assessment/Plan: 1. Functional deficits secondary to right paracentral cerebellar ICH which require 3+ hours per day of interdisciplinary therapy in a comprehensive inpatient rehab setting. Physiatrist is providing close team supervision and 24 hour management of active medical problems listed below. Physiatrist and rehab team continue to assess barriers to discharge/monitor patient progress toward functional and medical goals.    snf placement pending  FIM: FIM - Bathing Bathing Steps Patient Completed: Chest;Right Arm;Left Arm;Abdomen;Front perineal area;Buttocks;Left lower leg (including foot);Right lower leg (including foot);Left upper leg;Right upper leg Bathing: 5: Supervision: Safety issues/verbal cues  FIM - Upper Body Dressing/Undressing Upper body dressing/undressing steps patient completed: Thread/unthread right sleeve of pullover shirt/dresss;Pull shirt over trunk;Thread/unthread right sleeve of front closure shirt/dress;Thread/unthread left sleeve of front closure shirt/dress Upper body dressing/undressing: 5: Supervision: Safety issues/verbal cues FIM - Lower Body Dressing/Undressing Lower body dressing/undressing steps patient completed: Thread/unthread right pants leg;Thread/unthread left pants leg;Pull pants up/down;Don/Doff right sock;Don/Doff left sock;Pull underwear up/down Lower body dressing/undressing: 5: Supervision: Safety issues/verbal cues  FIM - Toileting Toileting steps completed by patient: Adjust clothing prior to toileting;Performs perineal hygiene;Adjust clothing after toileting Toileting: 4: Assist with fasteners  FIM - Radio producer Devices: Grab bars;Walker Toilet Transfers: 4-From toilet/BSC: Min A (steadying Pt. > 75%);4-To toilet/BSC: Min A (steadying Pt. > 75%)  FIM - Bed/Chair Transfer Bed/Chair Transfer Assistive Devices: Bed rails;Arm rests;Walker Bed/Chair  Transfer: 5: Supine > Sit: Supervision (verbal cues/safety issues);5: Sit > Supine: Supervision (verbal cues/safety  issues);4: Chair or W/C > Bed: Min A (steadying Pt. > 75%);4: Bed > Chair or W/C: Min A (steadying Pt. > 75%)  FIM - Locomotion: Wheelchair Distance: 150 Locomotion: Wheelchair: 5: Travels 150 ft or more: maneuvers on rugs and over door sills with supervision, cueing or coaxing FIM - Locomotion: Ambulation Locomotion: Ambulation Assistive Devices: Administrator Ambulation/Gait Assistance: 3: Mod assist Locomotion: Ambulation: 2: Travels 50 - 149 ft with moderate assistance (Pt: 50 - 74%)  Comprehension Comprehension Mode: Auditory Comprehension: 5-Understands basic 90% of the time/requires cueing < 10% of the time  Expression Expression Mode: Verbal Expression: 2-Expresses basic 25 - 49% of the time/requires cueing 50 - 75% of the time. Uses single words/gestures.  Social Interaction Social Interaction Mode: Asleep Social Interaction: 2-Interacts appropriately 25 - 49% of time - Needs frequent redirection.  Problem Solving Problem Solving Mode: Asleep Problem Solving: 2-Solves basic 25 - 49% of the time - needs direction more than half the time to initiate, plan or complete simple activities  Memory Memory Mode: Asleep Memory: 4-Recognizes or recalls 75 - 89% of the time/requires cueing 10 - 24% of the time   Medical Problem List and Plan:  1. Functional deficits secondary to Right paracentral cerebellar ICH  2. DVT Prophylaxis/Anticoagulation: Mechanical: Antiembolism stockings, knee (TED hose) Bilateral lower extremities  Sequential compression devices, below knee Bilateral lower extremities  3. Pain Management: Will continue vicodin prn.  4. H/o depression/Mood: prozac     -pt flat, withdrawn, not engaging.  5. Neuropsych: This patient is not capable of making decisions on her own behalf.  6. HTN: Will monitor with checks every 8 hours and titrate  medications as indicated. Continue lopressor bid.  7. Hep C: Not a candidate for treatment due to ongoing drug abuse.  8. H/o gastritis:   Pepcid.  . Encouraging intake 9. H/o Knee pain:  voltaren gel if needed..  10. Abnormal LFTs: still elevated. ?hep c and stress related.  11. Hypokelmia:  Labs normal  12. Thrombocytopenia: Will continue to monitor platelets as well as any signs of bleeding. Trending up 13. Recent diffuse rash:   resolved.       LOS (Days) 12 A FACE TO FACE EVALUATION WAS PERFORMED  Meredith Staggers 09/07/2013 8:28 AM

## 2013-09-07 NOTE — Progress Notes (Signed)
INITIAL NUTRITION ASSESSMENT  DOCUMENTATION CODES Per approved criteria  -Severe malnutrition in the context of chronic illness   INTERVENTION: Calorie count per PA-C. Note that calorie counts are only warranted if nutrition support is a viable option for the patient. Add Ensure Pudding po TID, each supplement provides 170 kcal and 4 grams of protein. Continue Magic cup TID with meals, each supplement provides 290 kcal and 9 grams of protein. Add Ensure Complete po BID, each supplement provides 350 kcal and 13 grams of protein, thickened to appropriate consistency. RD to continue to follow nutrition care plan.  NUTRITION DIAGNOSIS: Inadequate oral intake related to poor appetite as evidenced by poor meal completion.   Goal: Intake to meet >90% of estimated nutrition needs.  Monitor:  weight trends, lab trends, I/O's, PO intake, supplement tolerance  Reason for Assessment: MD Consult for Poor PO Intake  58 y.o. female  Admitting Dx: stroke  ASSESSMENT: 58 y.o. female with history of Hep C, HTN, tobacco abuse who was admitted on 08/18/13 with worst HA of her life and fall with question LOC. She was intubated in ED and CT head with large cerebellar bleed with significant mass effect and probable tonsillar herniation and near complete obliteration of 4 th ventricle. UDS positive for ETOH and cocaine. She was started on mannitol as well as cardene drip and taken to OR emergently for suboccipital craniectomy and evacuation of cerebellar hematoma. Extubated without difficulty on 05/21 but failed MBSS. Patient with resultant dizzines, mild weakness left side, impaired balance, dysarthria and dysphagia.  MBSS 5/28 recommended Dysphagia 2, nectar liquids. Remains on this diet at this time. Meal intake is poor consuming mostly 25% of meals or less. Pt unwilling to talk to this RD at this time.   Pt is frequently refusing therapies per chart and discussions with staff members.  PA ordered  calorie count today. RD discussed that if oral intake is poor, RD will recommend initiation of nutrition support, however PA-C is stating that pt will refuse that.  Pt with 7.5% wt loss x 2 months. Pt meets criteria for severe MALNUTRITION in the context of acute illness as evidenced by intake of <50% x at least 5 days and 7.5% wt loss x 2 months.  CBG's: 84-127  Height: Ht Readings from Last 1 Encounters:  08/26/13 5\' 6"  (1.676 m)    Weight: Wt Readings from Last 1 Encounters:  08/26/13 185 lb 3 oz (84 kg)    Ideal Body Weight: 130 lb  % Ideal Body Weight: 142 lb  Wt Readings from Last 10 Encounters:  08/26/13 185 lb 3 oz (84 kg)  08/23/13 185 lb 6.4 oz (84.097 kg)  08/23/13 185 lb 6.4 oz (84.097 kg)  08/03/13 186 lb (84.369 kg)  06/01/13 200 lb (90.719 kg)  05/03/13 202 lb (91.627 kg)  03/30/13 206 lb (93.441 kg)    Usual Body Weight: 200 lb  % Usual Body Weight: 92.5%  BMI:  Body mass index is 29.9 kg/(m^2). Overweight  Estimated Nutritional Needs: Kcal: 1550 - 1750 Protein: 75 - 85 g Fluid: 1.6 - 1.8 liters daily  Skin:  Head incision L antecubital wound  Diet Order: Dysphagia 2 with Nectar Thickened Liquids  EDUCATION NEEDS: -No education needs identified at this time   Intake/Output Summary (Last 24 hours) at 09/07/13 1129 Last data filed at 09/06/13 2153  Gross per 24 hour  Intake      0 ml  Output    210 ml  Net   -  210 ml    Last BM: 6/7  Labs:   Recent Labs Lab 09/06/13 0523  NA 142  K 4.4  CL 107  CO2 25  BUN 10  CREATININE 0.66  CALCIUM 9.5  GLUCOSE 92    CBG (last 3)  No results found for this basename: GLUCAP,  in the last 72 hours  Scheduled Meds: . amoxicillin  500 mg Oral 3 times per day  . antiseptic oral rinse  15 mL Mouth Rinse BID  . famotidine  10 mg Oral BID  . FLUoxetine  20 mg Oral Daily  . metoprolol tartrate  25 mg Oral BID  . nicotine  14 mg Transdermal Daily  . senna-docusate  1 tablet Oral BID     Continuous Infusions: . sodium chloride 500 mL/hr at 09/07/13 1050    Past Medical History  Diagnosis Date  . Hepatitis C   . Pneumonia   . Hypertension   . Diabetes mellitus without complication     Past Surgical History  Procedure Laterality Date  . Neck surgery    . Shoulder surgery Bilateral   . Craniectomy N/A 08/18/2013    Procedure: CRANIECTOMY POSTERIOR FOSSA DECOMPRESSION, EVACUATION OF HEMORRHAGE;  Surgeon: Consuella Lose, MD;  Location: [facility];  Service: Neurosurgery;  Laterality: N/A;    Inda Coke MS, RD, LDN Inpatient Registered Dietitian Pager: [phone] After-hours pager: [phone]

## 2013-09-07 NOTE — Patient Care Conference (Signed)
Inpatient RehabilitationTeam Conference and Plan of Care Update Date: 09/07/2013   Time: 3:00  PM    Patient Name: [name]      Medical Record Number: [id number]  Date of Birth: Jul 29, 1955 Sex: Female         Room/Bed: 4W19C/4W19C-01 Payor Info: Payor: MEDICAID POTENTIAL / Plan: MEDICAID POTENTIAL / Product Type: *No Product type* /    Admitting Diagnosis: R cerebellar hemm  Admit Date/Time:  08/26/2013  6:22 PM Admission Comments: No comment available   Primary Diagnosis:  <principal problem not specified> Principal Problem: <principal problem not specified>  Patient Active Problem List   Diagnosis Date Noted  . ICH (intracerebral hemorrhage) 08/26/2013  . Cytotoxic cerebral edema 08/20/2013  . Brain herniation 08/20/2013  . Cerebellar hemorrhage, nontraumatic 08/18/2013  . Acute respiratory failure with hypoxia 08/18/2013  . Hepatitis C infection 08/03/2013  . Nausea with vomiting 08/03/2013  . Loss of weight 08/03/2013  . Arthritis 05/03/2013  . Influenza with respiratory manifestations 05/03/2013  . Acute hepatitis C virus infection without hepatic coma 05/03/2013    Expected Discharge Date: Expected Discharge Date:  (SNF)  Team Members Present: Social Worker Present: Lennart Pall, LCSW Nurse Present: Elliot Cousin, RN PT Present: Melene Plan, PT OT Present: Roanna Epley, [place], OT SLP Present: Weston Anna, SLP Other (Discipline and Name): Danne Baxter, RN [facility]) PPS Coordinator present : Daiva Nakayama, RN, CRRN;Becky Alwyn Ren, PT     Current Status/Progress Goal Weekly Team Focus  Medical   no changes. persistent behavioral issues  see prior  behavioral mgt   Bowel/Bladder   Continent of bowel, in and out caths at times  Continent of bowel, manage bladder  Push PO fluids, timed toileting to encourage patient to void   Swallow/Nutrition/ Hydration   Dys. 2 textures with nectar-thick liquids, Mod A  Mod A with least restrictive diet  trials  of upgraded liquids, increased utilization of swallowing strategies    ADL's   bathing/dressing-supervision; transfers-min A with occasional mod/max A with LOB; impulsivity; decreased willingness to participate; decreased safety awareness  LTGs downgraded: supervision overall; dynamic standing balance-min A  increased participatin; transfers; dynamic standing balance; safety awareness   Mobility   (S) for bed mobility, w/c propulsion, min-mod A for transfers, dynamic standing balance and ambulation dependent upon impulsivity, sustained attention to task   LTGs downgraded. (S) for dynamic sitting balance, bed mobility, w/c propulsion; Min A for dynamic standing balance, transfers and ambulation  participation in therapy, tolerance, safety during mobility, emergent awarness, standing mobility w/ RW   Communication   Max A  Mod A  increased utilization of intelligibility strategies    Safety/Cognition/ Behavioral Observations  Max A  Mod A  particiaption, attention, awareness, safety    Pain   Occasional headache managed with 1-2 norco PRN  Pain </=3  Treat pain when pain is at low level   Skin   Surgical incision to posterior head with dermabond intact  No new skin breakdown  Monitor incision qshift    Rehab Goals Patient on target to meet rehab goals: No Rehab Goals Revised: change in schedule to qd *See Care Plan and progress notes for long and short-term goals.  Barriers to Discharge: see prior    Possible Resolutions to Barriers:  supervision, reduce therapy intensity    Discharge Planning/Teaching Needs:  SNF following CIR      Team Discussion:  Continues to refuse to fully participate.  Only engaging in activities she wants to do i.e.  Eating, toileting.  SW pursuing SNF placement, however, awaiting [place] Level 2 clearance.  Revisions to Treatment Plan:  Decrease to qd tx only.   Continued Need for Acute Rehabilitation Level of Care: The patient requires daily medical  management by a physician with specialized training in physical medicine and rehabilitation for the following conditions: Daily direction of a multidisciplinary physical rehabilitation program to ensure safe treatment while eliciting the highest outcome that is of practical value to the patient.: Yes Daily medical management of patient stability for increased activity during participation in an intensive rehabilitation regime.: Yes Daily analysis of laboratory values and/or radiology reports with any subsequent need for medication adjustment of medical intervention for : Neurological problems;Other  Lennart Pall 09/07/2013, 5:46 PM

## 2013-09-07 NOTE — Progress Notes (Signed)
Occupational Therapy Session Note  Patient Details  Name: [name] MRN: [id number] Date of Birth: 08/11/1955  Today's Date: 09/07/2013  Session 1 Time: 0700-0740 Time Calculation (min): 40 min (pt missed 20 mins services-pt refused to continue)  Short Term Goals: Week 2:  OT Short Term Goal 1 (Week 2): STG=LTG  Skilled Therapeutic Interventions/Progress Updates:    Pt eager to take shower this morning.  Pt required min A/steady A for amb with RW to bathroom to take shower.  Pt continues to exhibit increased impulsivity and requires max verbal cues to slow down.  Pt completed bathing tasks seated on tub bench and employing lateral leans to bathe buttocks.  Pt utilized grab bars when standing to pull up pants.  Pt exhibited LOB X 2 during ambulation in room and required mod A to correct.  Pt verbally acknowledges that she has LOB but does not initiate righting reactions.  Pt sat EOB to don socks and immediately laid back into bed and stated "OK. Done. Bye" and refused to further participate in therapy.  Focus on activity tolerance, functional amb with RW, transfers, dynamic standing balance, participation, and safety awareness  Therapy Documentation Precautions:  Precautions Precautions: Fall Precaution Comments: impulsive, decreased spatial awareness Restrictions Weight Bearing Restrictions: No Pain:  Pt denied pain  See FIM for current functional status  Therapy/Group: Individual Therapy  Session 2 Time:1300-1315 (pt missed 30 mins Ot services-refusal) Pt denied pain Individual therapy   Pt resting in bed upon arrival but easily aroused.  Pt initially agreeable to getting OOB because she needed to use bathroom.  Pt required min A for amb with RW to bathroom and steady A when standing to pull up pants.  Upon exiting bathroom patient walked towards bed and stated that she wasn't going to walk any more.  Pt encouraged to participate but patient kept walking towards bed and  lost balance attempting to get into bed when not close enough.  Pt required max A to maintain balance before sitting on bed.  Pt's transitional movements continue to be impulsive requiring min/mod A for control and balance when ambulating.  Focus on activity tolerance, standing balance, functional amb with RW, and safety awareness.  Leroy Libman 09/07/2013, 7:46 AM

## 2013-09-07 NOTE — Progress Notes (Signed)
Patient continues to have poor PO intake of fluids and solids; bladder scan of 156 after 12 hours of no void.  Algis Liming, PA notified of intake and bladder scan.  New orders received.  Will continue to monitor.

## 2013-09-07 NOTE — Plan of Care (Signed)
Pt's plan of care will change to QD of all therapies due to frequent refusals of therapy sessions, slow progress and pending SNF placement. This change will occur after conference and discussion with physician and care team.   [name], [place], [place]

## 2013-09-07 NOTE — Progress Notes (Signed)
Made aware by nursing about patient's poor po intake as well as decrease in UOP. PVR 150 cc past 12 hours. Records reviewed and note that po intake has declined since middle of last week--documented at 5-50%. Likely mood related due to pending SNF disposition. Will bolus with 500 cc IVF to help with UOP as well as low BP. Patient dislikes nectar liquids but renal status stable.  Start calorie count as well as strict I and O's record.

## 2013-09-07 NOTE — Progress Notes (Signed)
Patient got up to the bathroom and attempted to void but was unable.  Patient in and out cathed for 450 mL of very dark urine.  Algis Liming, PA notified; no new orders received.

## 2013-09-07 NOTE — Progress Notes (Signed)
Speech Language Pathology Daily Session Note  Patient Details  Name: SITA MANGEN MRN: [id number] Date of Birth: 1955/05/19  Today's Date: 09/07/2013 Time: 0800-0825 Time Calculation (min): 25 min  Short Term Goals: Week 2: SLP Short Term Goal 1 (Week 2): Pt will consume current diet with Min A verbal cues to minimize overt s/s of aspiration.  SLP Short Term Goal 2 (Week 2): Pt will utilize a slow rate of speech to increase speech intelligibility with Mod multimodal cues.  SLP Short Term Goal 3 (Week 2): Pt will demonstrate sustained attention to a functional task for 10 minutes with Mod A verbal cues for redirection. SLP Short Term Goal 4 (Week 2): Pt will identify 2 cognitive impairments with Max multimodal cues.  SLP Short Term Goal 5 (Week 2): Pt will utilize call bell to request assistance with Mod multimodal cues.  SLP Short Term Goal 6 (Week 2): Pt will demonstrate functional problem solving for basic and familiar tasks with Mod A multimodal cues.   Skilled Therapeutic Interventions: Treatment focused on speech, swallowing, and cognitive goals. SLP facilitated session by providing Max verbal and question cues for utilization of speech intelligibility strategies (particularly a slow rate) to increase intelligibility at the word and phrase level. Pt consumed breakfast meal of Dys 1 and 2 textures and nectar thick liquids without overt s/s of aspiration with Mod verbal cues for use of safe swallowing strategies. Pt also required Min verbal and question cues for functional problem solving with 4 step sequencing cards due to impulsivity. Pt declined participation with remaining 20 minutes of session. Continue plan of care.   FIM:  Comprehension Comprehension Mode: Auditory Comprehension: 5-Understands basic 90% of the time/requires cueing < 10% of the time Expression Expression Mode: Verbal Expression: 2-Expresses basic 25 - 49% of the time/requires cueing 50 - 75% of the time. Uses  single words/gestures. Social Interaction Social Interaction: 2-Interacts appropriately 25 - 49% of time - Needs frequent redirection. Problem Solving Problem Solving: 2-Solves basic 25 - 49% of the time - needs direction more than half the time to initiate, plan or complete simple activities Memory Memory: 3-Recognizes or recalls 50 - 74% of the time/requires cueing 25 - 49% of the time FIM - Eating Eating Activity: 5: Needs verbal cues/supervision  Pain No/Denies Pain  Therapy/Group: Individual Therapy  Buzzy Han 09/07/2013, 8:36 AM

## 2013-09-08 ENCOUNTER — Inpatient Hospital Stay (HOSPITAL_COMMUNITY): Payer: Medicaid Other | Admitting: Speech Pathology

## 2013-09-08 ENCOUNTER — Inpatient Hospital Stay (HOSPITAL_COMMUNITY): Payer: No Typology Code available for payment source

## 2013-09-08 ENCOUNTER — Encounter (HOSPITAL_COMMUNITY): Payer: No Typology Code available for payment source | Admitting: Occupational Therapy

## 2013-09-08 DIAGNOSIS — I619 Nontraumatic intracerebral hemorrhage, unspecified: Secondary | ICD-10-CM

## 2013-09-08 DIAGNOSIS — I69993 Ataxia following unspecified cerebrovascular disease: Secondary | ICD-10-CM

## 2013-09-08 LAB — CBC
HCT: 42.4 % (ref 36.0–46.0)
HEMOGLOBIN: 14.3 g/dL (ref 12.0–15.0)
MCH: 36 pg — AB (ref 26.0–34.0)
MCHC: 33.7 g/dL (ref 30.0–36.0)
MCV: 106.8 fL — ABNORMAL HIGH (ref 78.0–100.0)
PLATELETS: 131 10*3/uL — AB (ref 150–400)
RBC: 3.97 MIL/uL (ref 3.87–5.11)
RDW: 11.9 % (ref 11.5–15.5)
WBC: 5.9 10*3/uL (ref 4.0–10.5)

## 2013-09-08 NOTE — Progress Notes (Signed)
Calorie Count Note  72 hour calorie count ordered.  Diet: Dysphagia 2 with Nectar Thickened Liquids Supplements: Ensure Pudding po TID, Ensure Complete po BID  Breakfast: 300 kcal, 4 grams protein Lunch: 200 kcal, 2 grams protein Dinner: 100 kcal, 1 gram protein Supplements: 340 kcal, 8 grams protein  Total intake: 940 kcal (61% of minimum estimated needs)  15 grams protein (20% of minimum estimated needs)  Nutrition Dx: Inadequate oral intake related to poor appetite as evidenced by poor meal completion.   Goal: Intake to meet >90% of estimated nutrition needs.  Estimated nutrition needs: Kcal: 1550 - 1750  Protein: 75 - 85 g  Fluid: 1.6 - 1.8 liters daily  Intervention:  Calorie count per PA-C. Note that calorie counts are only warranted if nutrition support is a viable option for the patient. Continue current supplement regimen. RD to continue to follow nutrition care plan.  Inda Coke MS, RD, LDN Inpatient Registered Dietitian Pager: [phone] After-hours pager: [phone]

## 2013-09-08 NOTE — Progress Notes (Signed)
Occupational Therapy Session Note  Patient Details  Name: AASHIKA CARTA MRN: [id number] Date of Birth: 02/01/1956  Today's Date: 09/08/2013 Time: 0900-0923 Time Calculation (min): 23 min  Skilled Therapeutic Interventions/Progress Updates:    Patient seen this am for OT intervention to address transitional movements, static to dynamic stand balance, stand tolerance and motion sensitivity / vestibular dysfunction.  Patient already showered and dressed this am with nursing tech per her desire.  Patient agreeable to addressing balance issues.  Patient with strong posterior preference in standing and bias toward left side.  Worked to activate bilateral lower extremities and midline alignment of trunk in transition from sit to stand.  Also addressed safety in transitioning from stand to sit, encouraging hip flexion with trunk flexion and symmetrically using both legs equally.    Therapy Documentation Precautions:  Precautions Precautions: Fall Precaution Comments: impulsive, decreased spatial awareness Restrictions Weight Bearing Restrictions: No  Pain:  Report of right hip pain   See FIM for current functional status  Therapy/Group: Individual Therapy  Mariah Milling 09/08/2013, 9:24 AM

## 2013-09-08 NOTE — Progress Notes (Signed)
Physical Therapy Session Note  Patient Details  Name: [name] MRN: [id number] Date of Birth: [birth year]  Today's Date: 09/08/2013  Skilled Therapeutic Interventions/Progress Updates:  Pt received semi-reclined in bed w/ blanket over head, would not respond to therapist until blanket pulled back and still would not open eyes. Pt declined participation in scheduled 36minutes of skilled physical therapy due to "not feeling well." Pt unable to further elaborate with questioning, pulling blanket back over face again stating, "No!" to participation in therapy despite education regarding benefits, examples of improvements noted during therapy on Monday or offering to assist pt to bathroom. Pt left in bed w/ all needs in reach, bed alarm on and RN aware.   Therapy Documentation Precautions:  Precautions Precautions: Fall Precaution Comments: impulsive, decreased spatial awareness Restrictions Weight Bearing Restrictions: No  See FIM for current functional status  Therapy/Group: Individual Therapy  Gilmore Laroche 09/08/2013, 9:49 AM

## 2013-09-08 NOTE — Progress Notes (Signed)
Physical Therapy Session Note  Patient Details  Name: [name] MRN: [id number] Date of Birth: 26-May-1955  Today's Date: 09/08/2013 Time: 4742-5956 Time Calculation (min): 15 min  Short Term Goals: Week 2:  PT Short Term Goal 1 (Week 2): STGs=LTGs due to anticipated LOS  Skilled Therapeutic Interventions/Progress Updates:  Therapist present in attempt to make up missed time from therapy earlier in day. Pt received supine in bed awake and immediately stated need to use bathroom. Supervision for t/f sup<>sit. Prior to ambulating to bathroom, pt verbalized understanding regarding need to go slowly for increased safety, improved quality of mobility and decreased need to assist. Pt req min guard-min A to amb w/ RW bed<>bathroom as well as for overall toileting. Pt demonstrating significantly improved coordination and safety due to moving slowly. Encouragement provided to engage pt in remainder of session, however, pt immediately returning to supine position in bed stating, "I've been up all day!" Pt educated on lack of participation in therapy as well as lying in bed all day and responding, "Oh? Oh well. " Pt supine in bed w/ all needs in reach, bed alarm on.   Therapy Documentation Precautions:  Precautions Precautions: Fall Precaution Comments: impulsive, decreased spatial awareness Restrictions Weight Bearing Restrictions: No  See FIM for current functional status  Therapy/Group: Individual Therapy  Gilmore Laroche 09/08/2013, 4:29 PM

## 2013-09-08 NOTE — Progress Notes (Signed)
Speech Language Pathology Daily Session Note  Patient Details  Name: [name] MRN: [id number] Date of Birth: 1955-11-01  Today's Date: 09/08/2013 Time: 0800-0815 Time Calculation (min): 15 min  Short Term Goals: Week 2: SLP Short Term Goal 1 (Week 2): Pt will consume current diet with Min A verbal cues to minimize overt s/s of aspiration.  SLP Short Term Goal 2 (Week 2): Pt will utilize a slow rate of speech to increase speech intelligibility with Mod multimodal cues.  SLP Short Term Goal 3 (Week 2): Pt will demonstrate sustained attention to a functional task for 10 minutes with Mod A verbal cues for redirection. SLP Short Term Goal 4 (Week 2): Pt will identify 2 cognitive impairments with Max multimodal cues.  SLP Short Term Goal 5 (Week 2): Pt will utilize call bell to request assistance with Mod multimodal cues.  SLP Short Term Goal 6 (Week 2): Pt will demonstrate functional problem solving for basic and familiar tasks with Mod A multimodal cues.   Skilled Therapeutic Interventions: Treatment focused on speech, swallowing, and cognitive goals. SLP facilitated session by providing Max verbal and question cues for utilization of speech intelligibility strategies (particularly a slow rate) to increase intelligibility at the word and phrase level. Pt consumed breakfast meal of Dys 1 and 2 textures and nectar thick liquids without overt s/s of aspiration with Max verbal cues for use of safe swallowing strategies. Pt required encouragement for PO intake and participation in session and declined participation with remaining 15 minutes of session. Continue plan of care.   FIM:  Comprehension Comprehension Mode: Auditory Comprehension: 5-Understands basic 90% of the time/requires cueing < 10% of the time Expression Expression Mode: Verbal Expression: 2-Expresses basic 25 - 49% of the time/requires cueing 50 - 75% of the time. Uses single words/gestures. Social Interaction Social  Interaction: 2-Interacts appropriately 25 - 49% of time - Needs frequent redirection. Problem Solving Problem Solving: 2-Solves basic 25 - 49% of the time - needs direction more than half the time to initiate, plan or complete simple activities Memory Memory: 2-Recognizes or recalls 25 - 49% of the time/requires cueing 51 - 75% of the time FIM - Eating Eating Activity: 5: Needs verbal cues/supervision  Pain No/Denies Pain  Therapy/Group: Individual Therapy  Tarick Parenteau 09/08/2013, 8:21 AM

## 2013-09-08 NOTE — Progress Notes (Signed)
[facility]IC[facility] RE[facility]ABILITATION     PROGRESS NOTE    Subjective/Complaints: Pt continues to be non-compliant, refuses therapies/interventions ROS still limited due to cognition/behavior  Objective: Vital Signs: Blood pressure 99/64, pulse 75, temperature 98.1 F (36.7 C), temperature source Oral, resp. rate 18, height 5\' 6"  (1.676 m), weight 84 kg (185 lb 3 oz), SpO2 99.00%. No results found. No results found for this basename: WBC, HGB, HCT, PLT,  in the last 72 hours  Recent Labs  09/06/13 0523  NA 142  K 4.4  CL 107  GLUCOSE 92  BUN 10  CREATININE 0.66  CALCIUM 9.5   CBG (last 3)  No results found for this basename: GLUCAP,  in the last 72 hours  Wt Readings from Last 3 Encounters:  08/26/13 84 kg (185 lb 3 oz)  08/23/13 84.097 kg (185 lb 6.4 oz)  08/23/13 84.097 kg (185 lb 6.4 oz)    Physical Exam:   Constitutional:  nondistressed.  HENT:  Head: Normocephalic and atraumatic.  adentulous  Eyes: Conjunctivae are normal. Pupils are equal, round, and reactive to light. Right eye exhibits no discharge and no exudate. Left eye exhibits no discharge and no exudate. Right conjunctiva is not injected. Right eye exhibits nystagmus. Left eye exhibits nystagmus.  Neck: Normal range of motion. Neck supple.  Cardiovascular: Normal rate and regular rhythm.  Respiratory: Effort normal and breath sounds normal.  GI: Soft. Bowel sounds are normal. She exhibits no distension. There is no tenderness.   Musculoskeletal: She exhibits no edema and no tenderness.  Neurological: She is alert and oriented to person, place, and time.    She is able to follow basic commands without difficulty. She continues to display moderate to severe dysarthria. Severe dysmetria bilateral finger nose finger, mod dymetria R Heel to [place]. Impulsive with poor attention and impaired insight   Motor is 5/5 in bilateral delt, bi, tri , grip, HF, KE, ADF Skin: rash right shoulder  resolved Psych: flat. Still often keeps eyes closed  Assessment/Plan: 1. Functional deficits secondary to right paracentral cerebellar ICH which require 3+ hours per day of interdisciplinary therapy in a comprehensive inpatient rehab setting. Physiatrist is providing close team supervision and 24 hour management of active medical problems listed below. Physiatrist and rehab team continue to assess barriers to discharge/monitor patient progress toward functional and medical goals.    snf placement pending  FIM: FIM - Bathing Bathing Steps Patient Completed: Chest;Right Arm;Left Arm;Abdomen;Front perineal area;Buttocks;Left lower leg (including foot);Right lower leg (including foot);Left upper leg;Right upper leg Bathing: 5: Supervision: Safety issues/verbal cues  FIM - Upper Body Dressing/Undressing Upper body dressing/undressing steps patient completed: Thread/unthread right sleeve of pullover shirt/dresss;Pull shirt over trunk;Thread/unthread right sleeve of front closure shirt/dress;Thread/unthread left sleeve of front closure shirt/dress Upper body dressing/undressing: 5: Supervision: Safety issues/verbal cues FIM - Lower Body Dressing/Undressing Lower body dressing/undressing steps patient completed: Thread/unthread right pants leg;Thread/unthread left pants leg;Pull pants up/down;Don/Doff right sock;Don/Doff left sock;Pull underwear up/down Lower body dressing/undressing: 5: Supervision: Safety issues/verbal cues  FIM - Toileting Toileting steps completed by patient: Adjust clothing prior to toileting;Performs perineal hygiene;Adjust clothing after toileting Toileting: 4: Steadying assist  FIM - Radio producer Devices: Grab bars;Walker Toilet Transfers: 4-From toilet/BSC: Min A (steadying Pt. > 75%);4-To toilet/BSC: Min A (steadying Pt. > 75%)  FIM - Bed/Chair Transfer Bed/Chair Transfer Assistive Devices: Bed rails;Arm rests;Walker Bed/Chair  Transfer: 0: Activity did not occur  FIM - Locomotion: Wheelchair Distance: 150 Locomotion: Wheelchair: 0:  Activity did not occur FIM - Locomotion: Ambulation Locomotion: Ambulation Assistive Devices: Walker - Rolling Ambulation/Gait Assistance: Not tested (comment) Locomotion: Ambulation: 0: Activity did not occur  Comprehension Comprehension Mode: Auditory Comprehension: 5-Understands basic 90% of the time/requires cueing < 10% of the time  Expression Expression Mode: Verbal Expression: 2-Expresses basic 25 - 49% of the time/requires cueing 50 - 75% of the time. Uses single words/gestures.  Social Interaction Social Interaction Mode: Asleep Social Interaction: 2-Interacts appropriately 25 - 49% of time - Needs frequent redirection.  Problem Solving Problem Solving Mode: Asleep Problem Solving: 2-Solves basic 25 - 49% of the time - needs direction more than half the time to initiate, plan or complete simple activities  Memory Memory Mode: Asleep Memory: 3-Recognizes or recalls 50 - 74% of the time/requires cueing 25 - 49% of the time   Medical Problem List and Plan:  1. Functional deficits secondary to Right paracentral cerebellar ICH  2. DVT Prophylaxis/Anticoagulation: Mechanical: Antiembolism stockings, knee (TED hose) Bilateral lower extremities  Sequential compression devices, below knee Bilateral lower extremities  3. Pain Management: Will continue vicodin prn.  4. H/o depression/Mood: prozac     -pt flat, sometimes irritable  5. Neuropsych: This patient is not capable of making decisions on her own behalf.  6. HTN: Will monitor with checks every 8 hours and titrate medications as indicated. Continue lopressor bid.  7. Hep C: Not a candidate for treatment due to ongoing drug abuse.  8. H/o gastritis:   Pepcid.  . Encouraging intake 9. H/o Knee pain:  voltaren gel if needed..  10. Abnormal LFTs: still elevated. ?hep c and stress related.  11. Hypokelmia:  Labs normal   12. Thrombocytopenia: recheck cbc tomorrow  13. Recent diffuse rash:   resolved.       LOS (Days) 13 A FACE TO FACE EVALUATION WAS PERFORMED  SWARTZ,ZACHARY T 09/08/2013 8:16 AM

## 2013-09-09 ENCOUNTER — Inpatient Hospital Stay (HOSPITAL_COMMUNITY): Payer: No Typology Code available for payment source

## 2013-09-09 ENCOUNTER — Inpatient Hospital Stay (HOSPITAL_COMMUNITY): Payer: Medicaid Other | Admitting: Speech Pathology

## 2013-09-09 ENCOUNTER — Encounter (HOSPITAL_COMMUNITY): Payer: No Typology Code available for payment source

## 2013-09-09 DIAGNOSIS — D696 Thrombocytopenia, unspecified: Secondary | ICD-10-CM | POA: Diagnosis present

## 2013-09-09 DIAGNOSIS — F4323 Adjustment disorder with mixed anxiety and depressed mood: Secondary | ICD-10-CM | POA: Diagnosis present

## 2013-09-09 DIAGNOSIS — N390 Urinary tract infection, site not specified: Secondary | ICD-10-CM | POA: Diagnosis not present

## 2013-09-09 MED ORDER — TRAZODONE 25 MG HALF TABLET: 25.0000 mg | ORAL_TABLET | Freq: Every evening | ORAL | Status: DC | PRN

## 2013-09-09 MED ORDER — SENNOSIDES-DOCUSATE SODIUM 8.6-50 MG PO TABS: 1.0000 | ORAL_TABLET | Freq: Two times a day (BID) | ORAL | Status: DC

## 2013-09-09 MED ORDER — METOPROLOL TARTRATE 25 MG PO TABS: 25.0000 mg | ORAL_TABLET | Freq: Two times a day (BID) | ORAL | Status: DC

## 2013-09-09 MED ORDER — SULFAMETHOXAZOLE-TRIMETHOPRIM 400-80 MG PO TABS
1.0000 | ORAL_TABLET | Freq: Two times a day (BID) | ORAL | Status: AC
Start: 2013-09-09 — End: 2013-09-14

## 2013-09-09 MED ORDER — ENSURE PUDDING PO PUDG: 1.0000 | Freq: Three times a day (TID) | ORAL | Status: DC

## 2013-09-09 MED ORDER — FAMOTIDINE 10 MG PO TABS: 10.0000 mg | ORAL_TABLET | Freq: Two times a day (BID) | ORAL | Status: DC

## 2013-09-09 NOTE — Progress Notes (Signed)
Occupational Therapy Note  Patient Details  Name: MILCA SYTSMA MRN: [id number] Date of Birth: 1955-10-26 Today's Date: 09/09/2013  Pt missed 60 mins skilled OT services.  Pt asleep in bed upon arrival but easily aroused.  Pt refused getting OOB for BADLs this morning.  Pt stated she had a "rough night" and just wanted to lay in bed.  Attempted to engage patient X 3 with no success.  Discussed with RN that patient refusing shower this morning but may want to shower later   Leroy Libman 09/09/2013, 7:38 AM

## 2013-09-09 NOTE — Progress Notes (Signed)
Physical Therapy Session Note  Patient Details  Name: [name] MRN: [id number] Date of Birth: 03-Dec-1955  Skilled Therapeutic Interventions/Progress Updates:  Pt received semi-reclined in bed, blanket over face. Pt declined participation in scheduled 39min of skilled physical therapy without medical reason despite max encouragement and education regarding benefit of participation in therapies. Pt left w/ all needs in reach, bed alarm on.   Therapy Documentation Precautions:  Precautions Precautions: Fall Precaution Comments: impulsive, decreased spatial awareness Restrictions Weight Bearing Restrictions: No General: Amount of Missed PT Time (min): 60 Minutes Missed Time Reason: Patient unwilling/refused to participate without medical reason  See FIM for current functional status  Therapy/Group: Individual Therapy  Gilmore Laroche 09/09/2013, 9:38 AM

## 2013-09-09 NOTE — Progress Notes (Signed)
Social Work Patient ID: [name], female   DOB: 01-09-1956, 58 y.o.   MRN: [id number]   Have reviewed team conference and d/c planning issues with pt's aunt, Gregary Signs.  Aunt aware that pt's participation has been quite poor on CIR and that I am continuing to pursue SNF placement for her.  Aunt hopeful pt could be placed locally, however, I have stressed to her (again) that this is highly doubtful as pt is still pending on her Medicaid and SSD.  FL2 is now out to facilities within a 100 mile radius of hospital.  Will keep staff updated on bed offers.  Hildegarde Dunaway, LCSW

## 2013-09-09 NOTE — Progress Notes (Signed)
Social Work Patient ID: Simona Huh, female   DOB: May 10, 1955, 58 y.o.   MRN: [id number]  Have received SNF bed offer for pt from [facility].  Have discussed with MD/PA and have medical clearance.  Have explained to pt and family about bed offer (along with one from a facility in [place]) and they are in agreement with move tomorrow to [facility].  Plan to transfer via ambulance with family to complete paperwork.  Tx team aware.  Planning to complete MBS prior to d/c to determine if can upgrade to thin liquids.    Yilin Weedon, LCSW

## 2013-09-09 NOTE — Discharge Summary (Signed)
Physician Discharge Summary  Patient ID: ROSMARIE ESQUIBEL MRN: [id number] DOB/AGE: [birth year] 58 y.o.  Admit date: 08/26/2013 Discharge date: 09/10/2013  Discharge Diagnoses:  Principal Problem:   ICH (intracerebral hemorrhage) Active Problems:   Hepatitis C infection   Adjustment disorder with mixed anxiety and depressed mood   UTI (urinary tract infection)   Thrombocytopenia, unspecified   Discharged Condition: stable   Labs:  Basic Metabolic Panel:  Recent Labs Lab 09/06/13 0523 09/10/13 0500  NA 142 144  K 4.4 4.5  CL 107 106  CO2 25 25  GLUCOSE 92 84  BUN 10 9  CREATININE 0.66 0.80  CALCIUM 9.5 9.8    CBC:  Recent Labs Lab 09/08/13 0928  WBC 5.9  HGB 14.3  HCT 42.4  MCV 106.8*  PLT 131*    Filed Vitals:   09/10/13 0600  BP: 114/60  Pulse: 60  Temp: 98.1 F (36.7 C)  Resp: 16    CBG: No results found for this basename: GLUCAP,  in the last 168 hours  Brief HPI:   Nevae O Carline is a 58 y.o. Left handed female with history of Hep C, HTN, tobacco abuse who was admitted on 08/18/13 with worst HA of her life and fall with question LOC. BP elevated at admission and  CT head with large cerebellar bleed with significant mass effect and probable tonsillar herniation and near complete obliteration of 4 th ventricle. UDS positive for ETOH and cocaine. She was taken to OR emergently for suboccipital craniectomy and evacuation of cerebellar hematoma by Dr Kathyrn Sheriff. Neurology felt that patient had hemorrhage due to malignant hypertension as result of cocaine use. She was placed on  Dysphaga 2, nectar liquids due to aspiration of thin. Patient with subsequent dizziness with HA, dysphagia, ataxic gait, impulsivity with poor safety as well as cognitive deficits. She was admitted to [facility] for progressive therapies and to reduce burden of care.    Hospital Course: [place] was admitted to rehab 08/26/2013 for inpatient therapies to consist of PT, ST and  OT at least three hours five days a week. Past admission physiatrist, therapy team and rehab RN have worked together to provide customized collaborative inpatient rehab. Blood pressures have been monitored closely and have been reasonably controlled.  She was showing evidence of adjustment disorder with depressed mood and prozac was resumed. She has been on modified diet with nectar liquids and has been able to maintain adequate hydration on this. Follow up labs showed hypokalemia has resolved and thrombocytopenia has been slowly improving. No signs of bleeding noted.  ALT remains elevated at 110-120 range.  Nutritional supplements were added to help increase calories and due low albumin levels.  She has been treated for enterococcus UTI and had recurrent infection due to E coli on 09/05/13. She needs max encouragement to push po fluid. She was started on septra DS for treatment and is to continue this for four additional days.   Therapy has focused on balance as well as vestibular dysfunction and sensitivity. Patient has had decrease in po intake in the past few days due to behavioral issues and perseveration on going home. she has requires max encouragement for consistent participation in therapies.  MBS was done today and has been advanced to thin liquids via cup but to continue dysphagia II diet.  Family has elected on SNF due  to inability to provide assistance needed.   She was agreeable to SNF search and bed is available at [facility]  in [place]. Patient is discharged to SNF in improved condition.    Rehab course: During patient's stay in rehab weekly team conferences were held to monitor patient's progress, set goals and discuss barriers to discharge. Patient has had improvement in activity tolerance, balance, postural control, as well as ability to compensate for deficits.  She requires supervision for transfers and min to min-guard assist for ambulating with RW. She requires min assist with  bathing and dressing tasks.    Disposition: Skilled Nursing Facility   Diet: Dysphagia II. Thin liquids--no straws.  Special Instructions: 1. Medications crushed in puree. 2.Needs full supervision with meals. Encourage and offer fluids frequently.    Medication List    STOP taking these medications       HYDROcodone-acetaminophen 5-325 MG per tablet  Commonly known as:  NORCO/VICODIN     hydrOXYzine 10 MG tablet  Commonly known as:  ATARAX/VISTARIL     insulin aspart 100 UNIT/ML injection  Commonly known as:  novoLOG     ofloxacin 0.3 % ophthalmic solution  Commonly known as:  OCUFLOX     predniSONE 20 MG tablet  Commonly known as:  DELTASONE     terbinafine 1 % cream  Commonly known as:  LAMISIL AT     zolpidem 5 MG tablet  Commonly known as:  AMBIEN      TAKE these medications       albuterol 108 (90 BASE) MCG/ACT inhaler  Commonly known as:  PROVENTIL HFA;VENTOLIN HFA  Inhale 2 puffs into the lungs every 6 (six) hours as needed for wheezing.     famotidine 10 MG tablet  Commonly known as:  PEPCID  Take 1 tablet (10 mg total) by mouth 2 (two) times daily.     feeding supplement (ENSURE) Pudg  Take 1 Container by mouth 3 (three) times daily with meals.     FLUoxetine 20 MG capsule  Commonly known as:  PROZAC  Take 1 capsule (20 mg total) by mouth daily.     metoprolol tartrate 25 MG tablet  Commonly known as:  LOPRESSOR  Take 1 tablet (25 mg total) by mouth 2 (two) times daily.     nicotine 14 mg/24hr patch  Commonly known as:  EQ NICOTINE  Place 1 patch (14 mg total) onto the skin daily.     senna-docusate 8.6-50 MG per tablet  Commonly known as:  Senokot-S  Take 1 tablet by mouth 2 (two) times daily.     sulfamethoxazole-trimethoprim 400-80 MG per tablet  Commonly known as:  BACTRIM,SEPTRA  Take 1 tablet by mouth every 12 (twelve) hours.     traZODone 25 mg Tabs tablet  Commonly known as:  DESYREL  Take 0.5-1 tablets (25-50 mg total) by  mouth at bedtime as needed for sleep.         SignedBary Leriche 09/10/2013, 9:56 AM

## 2013-09-09 NOTE — Progress Notes (Signed)
[place] PHYSICAL MEDICINE & REHABILITATION     PROGRESS NOTE    Subjective/Complaints: No new issues ROS still limited due to cognition/behavior  Objective: Vital Signs: Blood pressure 124/78, pulse 52, temperature 98 F (36.7 C), temperature source Oral, resp. rate 16, height 5\' 6"  (1.676 m), weight 84 kg (185 lb 3 oz), SpO2 99.00%. No results found.  Recent Labs  09/08/13 0928  WBC 5.9  HGB 14.3  HCT 42.4  PLT 131*   No results found for this basename: NA, K, CL, CO, GLUCOSE, BUN, CREATININE, CALCIUM,  in the last 72 hours CBG (last 3)  No results found for this basename: GLUCAP,  in the last 72 hours  Wt Readings from Last 3 Encounters:  08/26/13 84 kg (185 lb 3 oz)  08/23/13 84.097 kg (185 lb 6.4 oz)  08/23/13 84.097 kg (185 lb 6.4 oz)    Physical Exam:   Constitutional:  nondistressed.  HENT:  Head: Normocephalic and atraumatic.  adentulous  Eyes: Conjunctivae are normal. Pupils are equal, round, and reactive to light. Right eye exhibits no discharge and no exudate. Left eye exhibits no discharge and no exudate. Right conjunctiva is not injected. Right eye exhibits nystagmus. Left eye exhibits nystagmus.  Neck: Normal range of motion. Neck supple.  Cardiovascular: Normal rate and regular rhythm.  Respiratory: Effort normal and breath sounds normal.  GI: Soft. Bowel sounds are normal. She exhibits no distension. There is no tenderness.   Musculoskeletal: She exhibits no edema and no tenderness.  Neurological: She is alert and oriented to person, place, and time.    She is able to follow basic commands without difficulty. She continues to display moderate to severe dysarthria. Severe dysmetria bilateral finger nose finger, mod dymetria R Heel to [place]. Impulsive with poor attention and impaired insight   Motor is 5/5 in bilateral delt, bi, tri , grip, HF, KE, ADF Skin: rash right shoulder resolved Psych: flat. Still often keeps eyes  closed  Assessment/Plan: 1. Functional deficits secondary to right paracentral cerebellar ICH which require 3+ hours per day of interdisciplinary therapy in a comprehensive inpatient rehab setting. Physiatrist is providing close team supervision and 24 hour management of active medical problems listed below. Physiatrist and rehab team continue to assess barriers to discharge/monitor patient progress toward functional and medical goals.    snf placement pending  FIM: FIM - Bathing Bathing Steps Patient Completed: Chest;Right Arm;Left Arm;Abdomen;Front perineal area;Buttocks;Left lower leg (including foot);Right lower leg (including foot);Left upper leg;Right upper leg Bathing: 5: Supervision: Safety issues/verbal cues  FIM - Upper Body Dressing/Undressing Upper body dressing/undressing steps patient completed: Thread/unthread right sleeve of pullover shirt/dresss;Pull shirt over trunk;Thread/unthread right sleeve of front closure shirt/dress;Thread/unthread left sleeve of front closure shirt/dress Upper body dressing/undressing: 5: Supervision: Safety issues/verbal cues FIM - Lower Body Dressing/Undressing Lower body dressing/undressing steps patient completed: Thread/unthread right pants leg;Thread/unthread left pants leg;Pull pants up/down;Don/Doff right sock;Don/Doff left sock;Pull underwear up/down Lower body dressing/undressing: 5: Supervision: Safety issues/verbal cues  FIM - Toileting Toileting steps completed by patient: Adjust clothing prior to toileting;Performs perineal hygiene;Adjust clothing after toileting Toileting Assistive Devices: Grab bar or rail for support Toileting: 4: Steadying assist  FIM - Radio producer Devices: Grab bars;Walker Toilet Transfers: 4-From toilet/BSC: Min A (steadying Pt. > 75%);4-To toilet/BSC: Min A (steadying Pt. > 75%)  FIM - Bed/Chair Transfer Bed/Chair Transfer Assistive Devices: Bed rails;Arm  rests;Walker Bed/Chair Transfer: 5: Supine > Sit: Supervision (verbal cues/safety issues);5: Sit > Supine: Supervision (verbal cues/safety issues)  FIM - Locomotion: Wheelchair Distance: 150 Locomotion: Wheelchair: 0: Activity did not occur FIM - Locomotion: Ambulation Locomotion: Ambulation Assistive Devices: Administrator Ambulation/Gait Assistance: 4: Min assist Locomotion: Ambulation: 1: Travels less than 50 ft with minimal assistance (Pt.>75%)  Comprehension Comprehension Mode: Auditory Comprehension: 5-Understands basic 90% of the time/requires cueing < 10% of the time  Expression Expression Mode: Verbal Expression: 2-Expresses basic 25 - 49% of the time/requires cueing 50 - 75% of the time. Uses single words/gestures.  Social Interaction Social Interaction Mode: Asleep Social Interaction: 2-Interacts appropriately 25 - 49% of time - Needs frequent redirection.  Problem Solving Problem Solving Mode: Asleep Problem Solving: 2-Solves basic 25 - 49% of the time - needs direction more than half the time to initiate, plan or complete simple activities  Memory Memory Mode: Asleep Memory: 3-Recognizes or recalls 50 - 74% of the time/requires cueing 25 - 49% of the time   Medical Problem List and Plan:  1. Functional deficits secondary to Right paracentral cerebellar ICH  2. DVT Prophylaxis/Anticoagulation: Mechanical: Antiembolism stockings, knee (TED hose) Bilateral lower extremities  Sequential compression devices, below knee Bilateral lower extremities  3. Pain Management: Will continue vicodin prn.  4. H/o depression/Mood: prozac     -pt flat, sometimes irritable  5. Neuropsych: This patient is not capable of making decisions on her own behalf.  6. HTN: Will monitor with checks every 8 hours and titrate medications as indicated. Continue lopressor bid.  7. Hep C: Not a candidate for treatment due to ongoing drug abuse.  8. H/o gastritis:   Pepcid.  . Encouraging  intake 9. H/o Knee pain:  voltaren gel if needed..  10. Abnormal LFTs: mild elevation likely hep c and stress related.  11. Hypokelmia:  Labs normal  12. Thrombocytopenia: platelets stable around 130-150k  13. Recent diffuse rash:   resolved.       LOS (Days) 14 A FACE TO FACE EVALUATION WAS PERFORMED  SWARTZ,ZACHARY T 09/09/2013 7:58 AM

## 2013-09-09 NOTE — Progress Notes (Signed)
Calorie Count Note  Intervention:  Discontinue calorie count. Discussed results with PA-C, Reesa Chew. Per PA, pt is not a suitable candidate for enteral nutrition. Pt is unable to meet her nutrition needs at this time 2/2 cognition and behavioral issues. Continue current supplement regimen. If more aggressive nutrition interventions desired, please consult RD. RD to continue to follow nutrition care plan. 72 hour calorie count ordered.  Assessment: 72-hour calorie count ordered.  Diet: Dysphagia 2 with Nectar Thickened Liquids Supplements: Ensure Pudding po TID, Ensure Complete po BID  Day 1: Breakfast: 300 kcal, 4 grams protein Lunch: 200 kcal, 2 grams protein Dinner: 100 kcal, 1 gram protein Supplements: 340 kcal, 8 grams protein  Total intake: 940 kcal (61% of minimum estimated needs)  15 grams protein (20% of minimum estimated needs)  Day 2:  Breakfast: refused Lunch: 50 kcal, 2 grams protein Dinner: 200 kcal, 5 grams protein Supplements: 340 kcal, 8 grams protein  Total intake: 590 kcal (38% of minimum estimated needs)  15 grams protein (20% of minimum estimated needs)  Nutrition Dx: Inadequate oral intake related to poor appetite as evidenced by poor meal completion.   Goal: Intake to meet >90% of estimated nutrition needs.  Estimated nutrition needs: Kcal: 1550 - 1750  Protein: 75 - 85 g  Fluid: 1.6 - 1.8 liters daily  Inda Coke MS, RD, LDN Inpatient Registered Dietitian Pager: [phone] After-hours pager: [phone]

## 2013-09-09 NOTE — Progress Notes (Signed)
Speech Language Pathology Daily Session Note  Patient Details  Name: [name] MRN: [id number] Date of Birth: 04-Jul-1955  Today's Date: 09/09/2013 Time: 1115-1140 Time Calculation (min): 25 min  Short Term Goals: Week 2: SLP Short Term Goal 1 (Week 2): Pt will consume current diet with Min A verbal cues to minimize overt s/s of aspiration.  SLP Short Term Goal 2 (Week 2): Pt will utilize a slow rate of speech to increase speech intelligibility with Mod multimodal cues.  SLP Short Term Goal 3 (Week 2): Pt will demonstrate sustained attention to a functional task for 10 minutes with Mod A verbal cues for redirection. SLP Short Term Goal 4 (Week 2): Pt will identify 2 cognitive impairments with Max multimodal cues.  SLP Short Term Goal 5 (Week 2): Pt will utilize call bell to request assistance with Mod multimodal cues.  SLP Short Term Goal 6 (Week 2): Pt will demonstrate functional problem solving for basic and familiar tasks with Mod A multimodal cues.   Skilled Therapeutic Interventions: Treatment focus on cognitive and dysphagia goals. SLP facilitated by providing trials of thin liquids via cup. Pt consumed ~6 oz without overt s/s of aspiration but has a h/o silent aspiration on last modified. Pt demonstrated use of small sips and decreased impulsivity with minimal verbal cues. Pt verbalized she was hungry but would only consume ~3 bites of puree textures. Pt declined remaining 20 minutes of session.   Pt will have repeat MBS tomorrow to assess dysphagia for possible upgrade to thin liquids.    FIM:  Comprehension Comprehension Mode: Auditory Comprehension: 5-Understands basic 90% of the time/requires cueing < 10% of the time Expression Expression Mode: Verbal Expression: 2-Expresses basic 25 - 49% of the time/requires cueing 50 - 75% of the time. Uses single words/gestures. Social Interaction Social Interaction: 2-Interacts appropriately 25 - 49% of time - Needs frequent  redirection. Problem Solving Problem Solving: 2-Solves basic 25 - 49% of the time - needs direction more than half the time to initiate, plan or complete simple activities Memory Memory: 3-Recognizes or recalls 50 - 74% of the time/requires cueing 25 - 49% of the time FIM - Eating Eating Activity: 5: Needs verbal cues/supervision  Pain No/Denies Pain   Therapy/Group: Individual Therapy  Kiptyn Rafuse 09/09/2013, 11:49 AM

## 2013-09-10 ENCOUNTER — Inpatient Hospital Stay (HOSPITAL_COMMUNITY): Payer: Medicaid Other | Admitting: Speech Pathology

## 2013-09-10 ENCOUNTER — Encounter (HOSPITAL_COMMUNITY): Payer: No Typology Code available for payment source

## 2013-09-10 ENCOUNTER — Inpatient Hospital Stay (HOSPITAL_COMMUNITY): Payer: No Typology Code available for payment source

## 2013-09-10 ENCOUNTER — Inpatient Hospital Stay (HOSPITAL_COMMUNITY): Payer: Medicaid Other

## 2013-09-10 DIAGNOSIS — I619 Nontraumatic intracerebral hemorrhage, unspecified: Secondary | ICD-10-CM

## 2013-09-10 DIAGNOSIS — I69993 Ataxia following unspecified cerebrovascular disease: Secondary | ICD-10-CM

## 2013-09-10 LAB — BASIC METABOLIC PANEL
BUN: 9 mg/dL (ref 6–23)
CO2: 25 mEq/L (ref 19–32)
Calcium: 9.8 mg/dL (ref 8.4–10.5)
Chloride: 106 mEq/L (ref 96–112)
Creatinine, Ser: 0.8 mg/dL (ref 0.50–1.10)
GFR calc Af Amer: >90 mL/min (ref >90)
GFR, EST NON AFRICAN AMERICAN: 81 mL/min — AB (ref >90)
GLUCOSE: 84 mg/dL (ref 70–99)
POTASSIUM: 4.5 meq/L (ref 3.7–5.3)
SODIUM: 144 meq/L (ref 137–147)

## 2013-09-10 LAB — VITAMIN B12: Vitamin B-12: 985 pg/mL — ABNORMAL HIGH (ref 211–911)

## 2013-09-10 NOTE — Progress Notes (Signed)
[place] PHYSICAL MEDICINE & REHABILITATION     PROGRESS NOTE    Subjective/Complaints: Had MBS this am, results pending ROS still limited due to cognition/behavior  Objective: Vital Signs: Blood pressure 114/60, pulse 60, temperature 98.1 F (36.7 C), temperature source Oral, resp. rate 16, height 5\' 6"  (1.676 m), weight 80.5 kg (177 lb 7.5 oz), SpO2 100.00%. No results found.  Recent Labs  09/08/13 0928  WBC 5.9  HGB 14.3  HCT 42.4  PLT 131*    Recent Labs  09/10/13 0500  NA 144  K 4.5  CL 106  GLUCOSE 84  BUN 9  CREATININE 0.80  CALCIUM 9.8   CBG (last 3)  No results found for this basename: GLUCAP,  in the last 72 hours  Wt Readings from Last 3 Encounters:  09/09/13 80.5 kg (177 lb 7.5 oz)  08/23/13 84.097 kg (185 lb 6.4 oz)  08/23/13 84.097 kg (185 lb 6.4 oz)    Physical Exam:   Constitutional:  nondistressed.  HENT:  Head: Normocephalic and atraumatic.  adentulous  Eyes: Conjunctivae are normal. Pupils are equal, round, and reactive to light. Right eye exhibits no discharge and no exudate. Left eye exhibits no discharge and no exudate. Right conjunctiva is not injected. Right eye exhibits nystagmus. Left eye exhibits nystagmus.  Neck: Normal range of motion. Neck supple.  Cardiovascular: Normal rate and regular rhythm.  Respiratory: Effort normal and breath sounds normal.  GI: Soft. Bowel sounds are normal. She exhibits no distension. There is no tenderness.    Psych: flat. Still often keeps eyes closed  Assessment/Plan: 1. Functional deficits secondary to right paracentral cerebellar ICH which require 3+ hours per day of interdisciplinary therapy in a comprehensive inpatient rehab setting.     snf placement planned for today See d/c summary FIM: FIM - Bathing Bathing Steps Patient Completed: Chest;Right Arm;Left Arm;Abdomen;Front perineal area;Buttocks;Left lower leg (including foot);Right lower leg (including foot);Left upper leg;Right  upper leg Bathing: 5: Supervision: Safety issues/verbal cues  FIM - Upper Body Dressing/Undressing Upper body dressing/undressing steps patient completed: Thread/unthread right sleeve of pullover shirt/dresss;Pull shirt over trunk;Thread/unthread right sleeve of front closure shirt/dress;Thread/unthread left sleeve of front closure shirt/dress Upper body dressing/undressing: 6: More than reasonable amount of time FIM - Lower Body Dressing/Undressing Lower body dressing/undressing steps patient completed: Thread/unthread right pants leg;Thread/unthread left pants leg;Pull pants up/down;Don/Doff right sock;Don/Doff left sock;Pull underwear up/down Lower body dressing/undressing: 5: Supervision: Safety issues/verbal cues  FIM - Toileting Toileting steps completed by patient: Adjust clothing prior to toileting;Performs perineal hygiene;Adjust clothing after toileting Toileting Assistive Devices: Grab bar or rail for support Toileting: 5: Supervision: Safety issues/verbal cues  FIM - Radio producer Devices: Grab bars;Walker Toilet Transfers: 5-To toilet/BSC: Supervision (verbal cues/safety issues);5-From toilet/BSC: Supervision (verbal cues/safety issues)  FIM - Bed/Chair Transfer Bed/Chair Transfer Assistive Devices: Bed rails;Arm rests;Walker Bed/Chair Transfer: 5: Supine > Sit: Supervision (verbal cues/safety issues);5: Sit > Supine: Supervision (verbal cues/safety issues);4: Bed > Chair or W/C: Min A (steadying Pt. > 75%);4: Chair or W/C > Bed: Min A (steadying Pt. > 75%)  FIM - Locomotion: Wheelchair Distance: 250' Locomotion: Wheelchair: 5: Travels 150 ft or more: maneuvers on rugs and over door sills with supervision, cueing or coaxing FIM - Locomotion: Ambulation Locomotion: Ambulation Assistive Devices: Administrator Ambulation/Gait Assistance: 4: Min assist Locomotion: Ambulation: 4: Travels 150 ft or more with minimal assistance  (Pt.>75%)  Comprehension Comprehension Mode: Auditory Comprehension: 5-Understands basic 90% of the time/requires cueing < 10% of the  time  Expression Expression Mode: Verbal Expression: 5-Expresses basic 90% of the time/requires cueing < 10% of the time.  Social Interaction Social Interaction Mode: Asleep Social Interaction: 2-Interacts appropriately 25 - 49% of time - Needs frequent redirection.  Problem Solving Problem Solving Mode: Asleep Problem Solving: 2-Solves basic 25 - 49% of the time - needs direction more than half the time to initiate, plan or complete simple activities  Memory Memory Mode: Asleep Memory: 3-Recognizes or recalls 50 - 74% of the time/requires cueing 25 - 49% of the time   Medical Problem List and Plan:  1. Functional deficits secondary to Right paracentral cerebellar ICH  2. DVT Prophylaxis/Anticoagulation: Mechanical: Antiembolism stockings, knee (TED hose) Bilateral lower extremities  Sequential compression devices, below knee Bilateral lower extremities  3. Pain Management: Will continue vicodin prn.  4. H/o depression/Mood: prozac     -pt flat, sometimes irritable  5. Neuropsych: This patient is not capable of making decisions on her own behalf.  6. HTN: Will monitor with checks every 8 hours and titrate medications as indicated. Continue lopressor bid.  7. Hep C: Not a candidate for treatment due to ongoing drug abuse.  8. H/o gastritis:   Pepcid.  . Encouraging intake 9. H/o Knee pain:  voltaren gel if needed..  10. Abnormal LFTs: mild elevation likely hep c and stress related.  11. Hypokelmia:  Labs normal  12. Thrombocytopenia: platelets stable around 130-150k  13. Recent diffuse rash:   resolved.       LOS (Days) 15 A FACE TO FACE EVALUATION WAS PERFORMED  Charlett Blake 09/10/2013 9:36 AM

## 2013-09-10 NOTE — Progress Notes (Signed)
1415 Report called to receiving nurse "Donnelly Angelica" at [facility] "[facility]"). No further questions asked.  [facility] scheduled to pick patient up for discharge and transfer to SNF this afternoon.

## 2013-09-10 NOTE — Progress Notes (Signed)
Speech Language Pathology Discharge Summary  Patient Details  Name: [name] MRN: [id number] Date of Birth: 1956/01/19  Today's Date: 09/10/2013  Patient has met 6 of 6 long term goals.  Patient to discharge at overall Mod level.   Reasons goals not met: N/A   Clinical Impression/Discharge Summary: Patient has met 6 of 6 long term goals this admission. Patient's progress was inconsistent during this admission but patient exhibits improved attention, improved awareness, improved problem solving, improved speech intelligibility and improved swallowing function. Pt continues to demonstrate impulsivity and requires overall Mod A for safety with functional tasks in regards to cognitive function. Pt had repeat MBS on 09/10/13 and recommended Dys. 2 textures with thin liquids without straws. Family has not been present during therapy sessions and are unable to provide the necessary physical and cognitive assistance needed at this time, therefore, pt will discharge to a SNF. Pt would benefit from f/u SLP services to maximize cognitive and swallowing function and speech intelligibility in order to maximize her overall functional independence.  Care Partner:  Caregiver Able to Provide Assistance: Yes;No  Type of Caregiver Assistance: Physical;Cognitive  Recommendation:  Skilled Nursing facility  Rationale for SLP Follow Up: Maximize cognitive function and independence;Reduce caregiver burden;Maximize functional communication;Maximize swallowing safety   Equipment: N/A   Reasons for discharge: Treatment goals met;Discharged from hospital   Patient/Family Agrees with Progress Made and Goals Achieved: Yes   See FIM for current functional status  [name] 09/10/2013, 11:23 AM

## 2013-09-10 NOTE — Progress Notes (Signed)
Occupational Therapy Discharge Summary  Patient Details  Name: [name] MRN: [id number] Date of Birth: February 02, 1956  Today's Date: 09/10/2013  Patient has met 12 of 12 long term goals due to improved activity tolerance, improved balance, postural control and improved coordination.  Pt progress was inconsistent during this admission but patient exhibited improvement with BADLs and completed BADLs at close supervision level.  Pt required max encouragement to participate in therapy during this admission.  Pt exhibits impulsive behaviors and requires min verbal cues for safety awareness.  Pt occasionally exhibits LOB when ambulating with RW and requires min A to correct. And does present with some vestibular deficits resulting in dizziness with functional movements.  Family has not been present for any OT sessions.  Patient to discharge at overall Supervision level.  Patient's care partner unavailable to provide the necessary physical and cognitive assistance at discharge.     Recommendation:  Patient will benefit from ongoing skilled OT services in skilled nursing facility setting to continue to advance functional skills in the area of BADL, iADL and Reduce care partner burden.  Equipment: No equipment provided discharge to SNF  Reasons for discharge: treatment goals met and discharge from hospital  Patient/family agrees with progress made and goals achieved: Yes  OT Discharge   Vision/Perception  Vision- History Baseline Vision/History: No visual deficits Patient Visual Report: Blurring of vision Vision- Assessment Vision Assessment?: Yes Eye Alignment: Within Functional Limits Ocular Range of Motion: Within Functional Limits Alignment/Gaze Preference: Within Defined Limits Tracking/Visual Pursuits: Decreased smoothness of horizontal tracking;Decreased smoothness of vertical tracking Saccades: Additional eye shifts occurred during testing;Decreased speed of saccadic  movement;Impaired - to be further tested in functional context Convergence: Within functional limits Visual Fields: No apparent deficits Depth Perception: Overshoots Additional Comments: some nystagmus noted but pt denies dizziness Perception Comments: decreased spatial awareness  Cognition Overall Cognitive Status: Impaired/Different from baseline Arousal/Alertness: Lethargic Orientation Level: Oriented X4 Attention: Sustained Sustained Attention: Impaired Sustained Attention Impairment: Functional basic Selective Attention: Impaired Selective Attention Impairment: Functional basic Memory: Impaired Memory Impairment: Decreased recall of new information Decreased Short Term Memory: Functional basic Awareness: Impaired Awareness Impairment: Intellectual impairment Problem Solving: Impaired Problem Solving Impairment: Functional basic Self Correcting: Impaired Self Correcting Impairment: Functional basic Behaviors: Impulsive Safety/Judgment: Impaired Comments: requires constant cues for safety due to impulsivity Sensation Sensation Light Touch: Appears Intact Hot/Cold: Appears Intact Proprioception: Impaired by gross assessment Coordination Gross Motor Movements are Fluid and Coordinated: No Fine Motor Movements are Fluid and Coordinated: No Coordination and Movement Description: Grossly Ataxic Motor  Motor Motor: Ataxia Motor - Skilled Clinical Observations: Grossly ataxic, very impulsive Mobility    see FIM Trunk/Postural Assessment  Cervical Assessment Cervical Assessment: Within Functional Limits Thoracic Assessment Thoracic Assessment: Within Functional Limits Lumbar Assessment Lumbar Assessment: Within Functional Limits Postural Control Trunk Control: decreased in standing due to ataxia Righting Reactions: decreased in standing due to ataxia, impulsivity and vestibular involvements  Balance Static Sitting Balance Static Sitting - Balance Support: Bilateral  upper extremity supported;Feet supported Static Sitting - Level of Assistance: 6: Modified Independent Dynamic Sitting Balance Dynamic Sitting - Balance Support: Bilateral upper extremity supported;Left upper extremity supported;Right upper extremity supported;Feet supported Dynamic Sitting - Level of Assistance: 6: Modified independent Extremity/Trunk Assessment RUE Assessment RUE Assessment: Within Functional Limits LUE Assessment LUE Assessment: Within Functional Limits  See FIM for current functional status  Leroy Libman 09/10/2013, 7:00 AM

## 2013-09-10 NOTE — Progress Notes (Signed)
Physical Therapy Discharge Summary  Patient Details  Name: [name] MRN: [id number] Date of Birth: 1955-05-11  Today's Date: 09/10/2013 Time: 0750-0805 Time Calculation (min): 15 min  Patient has met 9 of 9 long term goals due to improved activity tolerance, improved balance, improved postural control, increased strength, decreased pain, ability to compensate for deficits, improved attention, improved awareness and improved coordination.  Patient to discharge at supervision at wheelchair level and min A with rolling walker at standing level. Patient's care partner unavailable to provide the necessary physical and cognitive assistance at discharge.  Reasons goals not met: N/A  Recommendation:  Patient will benefit from ongoing skilled PT services in skilled nursing facility setting to continue to advance safe functional mobility, address ongoing impairments in decreased functional endurance, decreased safety awareness, decreased intellectual/emergent awareness, decreased sustained/selective attention, vestibular impairments, impulsivity, decreased standing balance, decreased gross/fine motor coordination, ataxia, and minimize fall risk.  Equipment: No equipment provided  Reasons for discharge: treatment goals met and discharge from hospital  Patient/family agrees with progress made and goals achieved: Yes  Skilled Therapeutic Interventions 1:1. Pt received supine in bed, agreeable to participate in therapy with min encouragement. Focus this session on overall safety and decreased speed of movements during functional mobility. Pt able to demonstrate bed mobility and w/c propulsion x250' w/ overall supervision. Pt req min guard A for squat pivot t/f bed>w/c and able to demonstrate standing balance, ambulation x150' w/ RW and negotiation up/down 5 steps w/ singe rail with overall min A. Pt only req intermittent min cues for safety to maintain decreased speed of mobility throughout  session. Pt semi-reclined in bed at end of session w/ all needs in reach, bed alarm on.   PT Discharge Precautions/Restrictions Precautions Precautions: Fall Precaution Comments: impulsive Restrictions Weight Bearing Restrictions: No Vital Signs Therapy Vitals Temp: 98.1 F (36.7 C) Temp src: Oral Pulse Rate: 60 Resp: 16 BP: 114/60 mmHg Patient Position (if appropriate): Lying Oxygen Therapy SpO2: 100 % O2 Device: None (Room air) Pain Pain Assessment Pain Assessment: No/denies pain Vision/Perception  Vision - Assessment Eye Alignment: Within Functional Limits Ocular Range of Motion: Within Functional Limits Alignment/Gaze Preference: Within Defined Limits Tracking/Visual Pursuits: Decreased smoothness of horizontal tracking;Decreased smoothness of vertical tracking Saccades: Additional eye shifts occurred during testing;Decreased speed of saccadic movement;Impaired - to be further tested in functional context Convergence: Within functional limits Additional Comments: some nystagmus noted but pt denies dizziness Perception Comments: decreased spatial awareness  Cognition Overall Cognitive Status: Impaired/Different from baseline Arousal/Alertness: Lethargic Orientation Level: Oriented X4 Attention: Sustained;Selective Sustained Attention: Impaired Sustained Attention Impairment: Functional basic Selective Attention: Impaired Selective Attention Impairment: Functional basic Memory: Impaired Memory Impairment: Decreased recall of new information Decreased Short Term Memory: Functional basic Awareness: Impaired Awareness Impairment: Intellectual impairment;Emergent impairment Problem Solving: Impaired Problem Solving Impairment: Functional basic Self Correcting: Impaired Self Correcting Impairment: Functional basic Behaviors: Impulsive Safety/Judgment: Impaired Comments: requires constant cues for safety due to impulsivity; decreased impulsivity since time of  admission overall Sensation Sensation Light Touch: Appears Intact Hot/Cold: Appears Intact Proprioception: Impaired by gross assessment Coordination Gross Motor Movements are Fluid and Coordinated: No Fine Motor Movements are Fluid and Coordinated: No Coordination and Movement Description: Grossly Ataxic Motor  Motor Motor: Ataxia Motor - Skilled Clinical Observations: Grossly ataxic, very impulsive Motor - Discharge Observations: Ataxic and impulsive, but overall improved since time of admission  Mobility Bed Mobility Bed Mobility: Supine to Sit;Sit to Supine Supine to Sit: 5: Supervision;HOB flat;With rails Supine to Sit Details: Verbal  cues for precautions/safety Sit to Supine: 5: Supervision;With rail;HOB flat Sit to Supine - Details: Verbal cues for precautions/safety Transfers Transfers: Yes Sit to Stand: 4: Min guard Sit to Stand Details: Verbal cues for precautions/safety;Verbal cues for safe use of DME/AE Stand to Sit: 4: Min guard Stand to Sit Details (indicate cue type and reason): Verbal cues for precautions/safety;Verbal cues for safe use of DME/AE Stand Pivot Transfers: 4: Min assist Stand Pivot Transfer Details: Verbal cues for safe use of DME/AE;Verbal cues for precautions/safety Squat Pivot Transfers: 4: Min guard Squat Pivot Transfer Details: Verbal cues for precautions/safety Locomotion  Ambulation Ambulation: Yes Ambulation/Gait Assistance: 4: Min assist Ambulation Distance (Feet): 150 Feet Ambulation/Gait Assistance Details: Verbal cues for safe use of DME/AE;Verbal cues for precautions/safety Ambulation/Gait Assistance Details: rigid and uncoordinated Gait Gait: Yes Gait Pattern: Impaired Gait Pattern: Step-through pattern;Wide base of support;Decreased step length - left;Decreased step length - right;Decreased hip/knee flexion - left;Decreased hip/knee flexion - right;Trunk flexed;Ataxic Gait velocity: decreased Stairs / Additional  Locomotion Stairs: Yes Stairs Assistance: 4: Min guard Stairs Assistance Details: Verbal cues for precautions/safety Stair Management Technique: One rail Right;Sideways;Step to pattern Number of Stairs: 5 Wheelchair Mobility Wheelchair Mobility: Yes Wheelchair Assistance: 5: Investment banker, operational Details: Verbal cues for Information systems manager: Both upper extremities Wheelchair Parts Management: Supervision/cueing Distance: 250'  Trunk/Postural Assessment  Cervical Assessment Cervical Assessment: Within Functional Limits Thoracic Assessment Thoracic Assessment: Within Functional Limits Lumbar Assessment Lumbar Assessment: Within Functional Limits Postural Control Trunk Control: decreased in standing due to ataxia, overall improved Righting Reactions: decreased in standing due to ataxia, impulsivity and vestibular impairment; overall improved  Balance Balance Balance Assessed: Yes Static Sitting Balance Static Sitting - Balance Support: Bilateral upper extremity supported;Feet supported Static Sitting - Level of Assistance: 6: Modified independent (Device/Increase time) Dynamic Sitting Balance Dynamic Sitting - Balance Support: Bilateral upper extremity supported;Left upper extremity supported;Right upper extremity supported;Feet supported Dynamic Sitting - Level of Assistance: 6: Modified independent (Device/Increase time) Dynamic Sitting - Balance Activities: Lateral lean/weight shifting;Forward lean/weight shifting;Reaching for Consulting civil engineer Standing - Balance Support: Bilateral upper extremity supported Static Standing - Level of Assistance: 4: Min assist;5: Stand by assistance Dynamic Standing Balance Dynamic Standing - Balance Support: Bilateral upper extremity supported;Left upper extremity supported;Right upper extremity supported Dynamic Standing - Level of Assistance: 4: Min assist Dynamic Standing - Balance  Activities: Forward lean/weight shifting;Lateral lean/weight shifting;Reaching for objects Extremity Assessment  RUE Assessment RUE Assessment: Within Functional Limits LUE Assessment LUE Assessment: Within Functional Limits RLE Assessment RLE Assessment: Within Functional Limits LLE Assessment LLE Assessment: Within Functional Limits  See FIM for current functional status  Otis Brace S 09/10/2013, 8:21 AM

## 2013-09-10 NOTE — Progress Notes (Signed)
Social Work  Discharge Note  The overall goal for the admission was met for:   Discharge location: Yes - SNF as was plan upon CIR admit  Length of Stay: Yes - 15 days  Discharge activity level: No - min assist overall, however, poor participation on any consistent level  Home/community participation: Yes  Services provided included: MD, RD, PT, OT, SLP, RN, TR, Pharmacy and [place]: Other: Medicaid and SSD apps pending  Follow-up services arranged: Other: SNF @ [facility]  Comments (or additional information):  Patient/Family verbalized understanding of follow-up arrangements: Yes  Individual responsible for coordination of the follow-up plan: patient and cousin, Colletta [place]  Confirmed correct DME delivered: NA    Yessika Otte

## 2013-09-10 NOTE — Procedures (Signed)
Objective Swallowing Evaluation: Modified Barium Swallowing Study  Patient Details  Name: [name] MRN: [id number] Date of Birth: 10/31/1955  Today's Date: 09/10/2013 Time: 1791-5056 Time Calculation (min): 25 min  Past Medical History:  Past Medical History  Diagnosis Date  . Hepatitis C   . Pneumonia   . Hypertension   . Diabetes mellitus without complication    Past Surgical History:  Past Surgical History  Procedure Laterality Date  . Neck surgery    . Shoulder surgery Bilateral   . Craniectomy N/A 08/18/2013    Procedure: CRANIECTOMY POSTERIOR FOSSA DECOMPRESSION, EVACUATION OF HEMORRHAGE;  Surgeon: Consuella Lose, MD;  Location: [facility];  Service: Neurosurgery;  Laterality: N/A;   HPI:  58 year old female admitted 08/18/13 due to AMS with headache, fall and slurred speech. Pt intubated 5/20-21/15. PMH significant for HepC, PNA, HTN. CT revealed large posterior fossa hematoma with compression of the medulla/pons and effacement of the 4th ventricle.  Pt underwent posterior fossa decompression. Pt had MBS on 5/24 and recommended Dys. 2 textures with thin liquids. Pt transferred to CIR on 08/26/13 and has been intermittently participating in dysphagia treatment. Repeat MBS today to assess for possible diet upgrade.       Recommendation/Prognosis  Clinical Impression:   Dysphagia Diagnosis: Mild oral phase dysphagia;Mild pharyngeal phase dysphagia Clinical impression: Pt demonstrates a mild oropharyngeal dysphagia characterized by decreased bolus mastication due to impulsivity with solid textures. Pt also demonstrated a delayed swallow initiation to the valleculae resulting in flash penetration with large sips via cup and straw. Pt also demonstrated minimal residue with liquids that cleared with extra time and a spontaneous second swallow. Recommend pt continue Dys. 2 textures due to impulsivity and upgrade thin liquids via cup with full supervision to maximize  safety.    Swallow Evaluation Recommendations:  Diet Recommendations: Dysphagia 2 (Fine chop);Thin liquid Liquid Administration via: Cup;No straw Medication Administration: Crushed with puree Supervision: Full supervision/cueing for compensatory strategies Compensations: Slow rate;Small sips/bites;Multiple dry swallows after each bite/sip Postural Changes and/or Swallow Maneuvers: Out of bed for meals;Seated upright 90 degrees;Upright 30-60 min after meal Oral Care Recommendations: Oral care BID Follow up Recommendations: Skilled Nursing facility    Prognosis:  Prognosis for Safe Diet Advancement: Fair Barriers to Reach Goals: Cognitive deficits;Behavior   Individuals Consulted: Consulted and Agree with Results and Recommendations: Patient;PA;RN      SLP Assessment/Plan        General: Date of Onset: 08/18/13 Type of Study: Modified Barium Swallowing Study Reason for Referral: Objectively evaluate swallowing function Previous Swallow Assessment: MBS on 5/24 and recommended Dys. 2 textures with nectar-thick liquids  Diet Prior to this Study: Dysphagia 2 (chopped);Nectar-thick liquids Temperature Spikes Noted: No Respiratory Status: Room air History of Recent Intubation: Yes Length of Intubations (days): 2 days Date extubated: 08/19/13 Behavior/Cognition: Alert;Impulsive;Distractible;Requires cueing Oral Cavity - Dentition: Edentulous Oral Motor / Sensory Function: Impaired - see Bedside swallow eval Self-Feeding Abilities: Able to feed self;Needs assist Patient Positioning: Upright in chair Baseline Vocal Quality: Clear Volitional Cough: Strong Volitional Swallow: Able to elicit Anatomy: Within functional limits Pharyngeal Secretions: Not observed secondary MBS   Reason for Referral:   Objectively evaluate swallowing function    Oral Phase: Oral Preparation/Oral Phase Oral Phase: Impaired Oral - Nectar Oral - Nectar Teaspoon: Not tested Oral - Nectar Cup: Reduced  posterior propulsion Oral - Thin Oral - Thin Teaspoon: Reduced posterior propulsion Oral - Thin Cup: Reduced posterior propulsion Oral - Thin Straw: Reduced posterior propulsion  Oral - Solids Oral - Puree: Reduced posterior propulsion Oral - Mechanical Soft: Impaired mastication (due to impulisvity)   Pharyngeal Phase:  Pharyngeal Phase Pharyngeal Phase: Impaired Pharyngeal - Nectar Pharyngeal - Nectar Cup: Delayed swallow initiation;Reduced tongue base retraction Pharyngeal - Thin Pharyngeal - Thin Teaspoon: Reduced tongue base retraction;Delayed swallow initiation;Pharyngeal residue - valleculae Pharyngeal - Thin Cup: Delayed swallow initiation;Reduced tongue base retraction;Penetration/Aspiration during swallow;Pharyngeal residue - valleculae Penetration/Aspiration details (thin cup): Material enters airway, remains ABOVE vocal cords then ejected out Pharyngeal - Thin Straw: Delayed swallow initiation;Reduced tongue base retraction;Pharyngeal residue - valleculae Penetration/Aspiration details (thin straw): Material enters airway, remains ABOVE vocal cords then ejected out Pharyngeal - Solids Pharyngeal - Puree: Delayed swallow initiation Pharyngeal - Mechanical Soft: Delayed swallow initiation   Cervical Esophageal Phase  Cervical Esophageal Phase Cervical Esophageal Phase: [facility] Cervical Esophageal Phase - Comment Cervical Esophageal Comment: Pt demonstrated what appeared to be minimal backflow intermittently that did not impact swallow function    GN          Anisah Kuck 09/10/2013, 11:11 AM

## 2013-09-10 NOTE — Progress Notes (Signed)
1625 Pt. Discharged to SNF.   Transported via ambulance to [facility].  IV removed; VSS.  Report given to EMT.

## 2013-09-10 NOTE — Progress Notes (Signed)
Occupational Therapy Session Note  Patient Details  Name: [name] MRN: [id number] Date of Birth: 18-Nov-1955  Today's Date: 09/10/2013 Time: 0705-0736 Time Calculation (min): 31 min  Short Term Goals: Week 2:  OT Short Term Goal 1 (Week 2): STG=LTG  Skilled Therapeutic Interventions/Progress Updates:    Pt asleep in bed upon arrival but easily aroused.  Pt initially declined getting OOB for shower but finally agreed to take shower.  Pt completed BADLs at supervision level this morning.  Pt continues to exhibit ataxic movements during transfers and bathing and dressing tasks.  Pt continues to exhibit increased impulsivity and decreased safety awareness.  Pt exhibited LOB X 1 during ambulation with RW from bathroom requiring min A to correct.  Pt returned to bed at end of session with bed alarm on and all needs within reach.  Focus on activity tolerance, participation, functional amb with RW, standing balance, transfers, and safety awareness.  Pt did not report any dizziness during this session.   Therapy Documentation Precautions:  Precautions Precautions: Fall Precaution Comments: impulsive, decreased spatial awareness Restrictions Weight Bearing Restrictions: No General: General Amount of Missed OT Time (min): 14 Minutes Pain: Pain Assessment Pain Assessment: No/denies pain  See FIM for current functional status  Therapy/Group: Individual Therapy  Leroy Libman 09/10/2013, 7:39 AM

## 2013-10-22 ENCOUNTER — Telehealth: Payer: Self-pay | Admitting: Internal Medicine

## 2013-10-22 ENCOUNTER — Telehealth: Payer: Self-pay | Admitting: Emergency Medicine

## 2013-10-22 NOTE — Telephone Encounter (Signed)
Pt returning nurse's, pls f/u with pt.

## 2013-10-22 NOTE — Telephone Encounter (Signed)
Left message for pt to call when message received 

## 2013-10-25 ENCOUNTER — Telehealth: Payer: Self-pay | Admitting: Emergency Medicine

## 2013-10-25 NOTE — Telephone Encounter (Signed)
2nd message left for pt to call when message received for MRI results

## 2013-10-25 NOTE — Telephone Encounter (Signed)
Pt returning call regarding results. Please f/u with pt.

## 2013-11-09 ENCOUNTER — Telehealth: Payer: Self-pay | Admitting: Internal Medicine

## 2013-11-09 NOTE — Telephone Encounter (Signed)
Pt calling regarding refill on metoprolol tartrate (LOPRESSOR) 25 MG tablet. Please f/u with pt.

## 2013-11-10 ENCOUNTER — Telehealth: Payer: Self-pay | Admitting: Internal Medicine

## 2013-11-10 ENCOUNTER — Other Ambulatory Visit: Payer: Self-pay | Admitting: Emergency Medicine

## 2013-11-10 MED ORDER — METOPROLOL TARTRATE 25 MG PO TABS: 25.0000 mg | ORAL_TABLET | Freq: Two times a day (BID) | ORAL | Status: DC

## 2013-11-10 NOTE — Telephone Encounter (Signed)
Pt. Needs med refill metophorol  Please f/u with Pt

## 2013-11-10 NOTE — Telephone Encounter (Signed)
Pt.'s aunt calling because pt. Needs a refill on metoprolol tartrate (LOPRESSOR) 25 MG tablet. [name] also states that pt. Had a stroke in April and needs this medication. Please f/u with pt.

## 2013-11-12 ENCOUNTER — Telehealth: Payer: Self-pay | Admitting: Emergency Medicine

## 2013-11-12 ENCOUNTER — Telehealth: Payer: Self-pay | Admitting: *Deleted

## 2013-11-12 NOTE — Telephone Encounter (Signed)
Pt. Returning call for CMA.

## 2013-11-12 NOTE — Telephone Encounter (Signed)
Left message on VM to return my call. 

## 2013-11-12 NOTE — Telephone Encounter (Signed)
Pt received her medication and wanted a sooner appointment I transferred the the pt to the front office.

## 2013-11-12 NOTE — Telephone Encounter (Signed)
Pt states she picked medication up yesterday

## 2013-11-17 ENCOUNTER — Encounter: Payer: Medicaid Other | Attending: Physical Medicine & Rehabilitation | Admitting: Physical Medicine & Rehabilitation

## 2013-11-17 ENCOUNTER — Encounter: Payer: Self-pay | Admitting: Physical Medicine & Rehabilitation

## 2013-11-17 VITALS — BP 138/92 | HR 70 | Resp 14 | Ht 66.0 in | Wt 170.0 lb

## 2013-11-17 DIAGNOSIS — I69998 Other sequelae following unspecified cerebrovascular disease: Secondary | ICD-10-CM | POA: Diagnosis not present

## 2013-11-17 DIAGNOSIS — I619 Nontraumatic intracerebral hemorrhage, unspecified: Secondary | ICD-10-CM | POA: Diagnosis present

## 2013-11-17 DIAGNOSIS — F4323 Adjustment disorder with mixed anxiety and depressed mood: Secondary | ICD-10-CM | POA: Insufficient documentation

## 2013-11-17 DIAGNOSIS — I614 Nontraumatic intracerebral hemorrhage in cerebellum: Secondary | ICD-10-CM

## 2013-11-17 NOTE — Progress Notes (Signed)
Subjective:    Patient ID: [name], female    DOB: 1955/08/07, 58 y.o.   MRN: [id number]  HPI  [name] is here in follow up her right cerebellar hemorrhage. She is at home with her daughter. she is using her wheelchair primarily around the home. She uses the walker when her daughter is with her.   Her pain has improved. She denies any falls. Her appetite and mood have been better. She is sleeping well.   She remains on lopressor and prozac for bp and mood.     Pain Inventory Average Pain 5 Pain Right Now 0 My pain is intermittent and aching  In the last 24 hours, has pain interfered with the following? General activity 0 Relation with others 0 Enjoyment of life 0 What TIME of day is your pain at its worst? night Sleep (in general) Poor  Pain is worse with: laying down Pain improves with: sittang and standing Relief from Meds: no pain meds  Mobility walk with assistance use a walker ability to climb steps?  no do you drive?  no use a wheelchair  Function disabled: date disabled na I need assistance with the following:  dressing, bathing, meal prep, household duties and shopping Do you have any goals in this area?  yes  Neuro/Psych weakness tremor trouble walking  Prior Studies Any changes since last visit?  no  Physicians involved in your care Any changes since last visit?  no   Family History  Problem Relation Age of Onset  . Diabetes Father   . Diabetes Sister    History   Social History  . Marital Status: Single    Spouse Name: N/A    Number of Children: N/A  . Years of Education: N/A   Social History Main Topics  . Smoking status: Current Every Day Smoker -- 2.00 packs/day for 40 years    Types: Cigarettes  . Smokeless tobacco: Never Used  . Alcohol Use: 27.6 oz/week    42 Cans of beer, 4 Shots of liquor per week     Comment: "drinks all day long"  . Drug Use: Yes    Special: Cocaine  . Sexual Activity: Not Currently    Other Topics Concern  . None   Social History Narrative  . None   Past Surgical History  Procedure Laterality Date  . Neck surgery    . Shoulder surgery Bilateral   . Craniectomy N/A 08/18/2013    Procedure: CRANIECTOMY POSTERIOR FOSSA DECOMPRESSION, EVACUATION OF HEMORRHAGE;  Surgeon: Consuella Lose, MD;  Location: [facility];  Service: Neurosurgery;  Laterality: N/A;   Past Medical History  Diagnosis Date  . Hepatitis C   . Pneumonia   . Hypertension   . Diabetes mellitus without complication    BP 272/53  Pulse 70  Resp 14  Ht 5\' 6"  (1.676 m)  Wt 170 lb (77.111 kg)  BMI 27.45 kg/m2  SpO2 96%  Opioid Risk Score:   Fall Risk Score:      Review of Systems  Musculoskeletal: Positive for gait problem.  Neurological: Positive for tremors and weakness.  All other systems reviewed and are negative.      Objective:   Physical Exam   Constitutional: nondistressed.  HENT:  Head: Normocephalic and atraumatic.  adentulous  Eyes: Conjunctivae are normal. Pupils are equal, round, and reactive to light. Right eye exhibits no discharge and no exudate. Left eye exhibits no discharge and no exudate. Right conjunctiva is not  injected. Right eye exhibits nystagmus. Left eye exhibits nystagmus.  Neck: Normal range of motion. Neck supple.  Cardiovascular: Normal rate and regular rhythm.  Respiratory: Effort normal and breath sounds normal.  GI: Soft. Bowel sounds are normal. She exhibits no distension. There is no tenderness.  Musculoskeletal: She exhibits no edema and no tenderness.  Neurological: She is alert and oriented to person, place, and time.  She is able to follow basic commands without difficulty. She   displays moderate dysarthria. improved but persistent dysmetria bilateral finger nose finger, mod dymetria R Heel to [place]. Motor is 5/5 in bilateral delt, bi, tri , grip, HF, KE, ADF  Skin: rash right shoulder resolved  Psych: improved affect. Pleasant,  impulsive however.  Assessment/Plan:  1. Functional deficits secondary to Right paracentral cerebellar ICH    -reviewed safety, gait techniques today. Daughter is aware   -most importantly, needs to SLOW DOWN. Impulsivity remains an issue  -absolutely no ETOH, drugs, tobacco  Sequential compression devices, below knee Bilateral lower extremities  3. Pain Management: tylenol  4. H/o depression/Mood: prozac    6. HTN:  Continue lopressor bid. Keep an eye on DBP  7. Hep C: Not a candidate for treatment due to ongoing drug abuse.  8. H/o gastritis: Pepcid. . Encouraging intake    Fifteen minutes of face to face patient care time were spent during this visit. All questions were encouraged and answered.  Follow up with me in 3 months. Marland Kitchen

## 2013-11-17 NOTE — Patient Instructions (Signed)
PLEASE SLOW DOWN!!!!!!!!  WORK ON EQUAL STEPS WHEN YOU WALK!!!!!

## 2013-11-19 ENCOUNTER — Encounter: Payer: Self-pay | Admitting: Internal Medicine

## 2013-11-19 ENCOUNTER — Ambulatory Visit: Payer: Medicaid Other | Attending: Internal Medicine | Admitting: Internal Medicine

## 2013-11-19 VITALS — BP 124/85 | HR 64 | Temp 97.4°F | Resp 14 | Ht 66.0 in | Wt 171.0 lb

## 2013-11-19 DIAGNOSIS — F17200 Nicotine dependence, unspecified, uncomplicated: Secondary | ICD-10-CM | POA: Insufficient documentation

## 2013-11-19 DIAGNOSIS — I10 Essential (primary) hypertension: Secondary | ICD-10-CM | POA: Insufficient documentation

## 2013-11-19 DIAGNOSIS — B1920 Unspecified viral hepatitis C without hepatic coma: Secondary | ICD-10-CM | POA: Diagnosis not present

## 2013-11-19 DIAGNOSIS — G4700 Insomnia, unspecified: Secondary | ICD-10-CM | POA: Diagnosis not present

## 2013-11-19 DIAGNOSIS — I69920 Aphasia following unspecified cerebrovascular disease: Secondary | ICD-10-CM | POA: Insufficient documentation

## 2013-11-19 DIAGNOSIS — J302 Other seasonal allergic rhinitis: Secondary | ICD-10-CM

## 2013-11-19 DIAGNOSIS — J309 Allergic rhinitis, unspecified: Secondary | ICD-10-CM | POA: Diagnosis not present

## 2013-11-19 MED ORDER — ZOLPIDEM TARTRATE 5 MG PO TABS: 5.0000 mg | ORAL_TABLET | Freq: Every evening | ORAL | Status: DC | PRN

## 2013-11-19 MED ORDER — LORATADINE 10 MG PO TABS: 10.0000 mg | ORAL_TABLET | Freq: Every day | ORAL | Status: DC

## 2013-11-19 NOTE — Progress Notes (Signed)
Patient here to follow up on Hep C.  Need refill on medications.  Patient states Trazadone is not working she is either lethargic for a whole day or she pops up after she takes it. Would to discuss switching to Ambein. Patient also c/o itching ears.

## 2013-11-19 NOTE — Progress Notes (Signed)
Patient ID: [name], female   DOB: 01-02-1956, 58 y.o.   MRN: [id number]  CC: Hep c  HPI:  [name] is a 58 y.o. patient that is s/p stroke in April.  She has a history of untreated Hep C and is not interested in treatment.  She has some residual aphasia present but no weakness.  She c/o when she takes trazodone it does not help her sleep until a day later.  The next day she states that she is excessively sleepy and she has even tried taking the trazodone as early as 7 pm before bed.  She also reports ear itching for last two weeks and watery eyes at night.    Allergies  Allergen Reactions  . Ibuprofen Hives and Other (See Comments)    Burning of the skin.   Past Medical History  Diagnosis Date  . Hepatitis C   . Pneumonia   . Hypertension   . Diabetes mellitus without complication    Current Outpatient Prescriptions on File Prior to Visit  Medication Sig Dispense Refill  . albuterol (PROVENTIL HFA;VENTOLIN HFA) 108 (90 BASE) MCG/ACT inhaler Inhale 2 puffs into the lungs every 6 (six) hours as needed for wheezing.  3 Inhaler  3  . famotidine (PEPCID) 10 MG tablet Take 1 tablet (10 mg total) by mouth 2 (two) times daily.      . feeding supplement, ENSURE, (ENSURE) PUDG Take 1 Container by mouth 3 (three) times daily with meals.    0  . FLUoxetine (PROZAC) 20 MG capsule Take 1 capsule (20 mg total) by mouth daily.  30 capsule  3  . metoprolol tartrate (LOPRESSOR) 25 MG tablet Take 1 tablet (25 mg total) by mouth 2 (two) times daily.  60 tablet  5  . nicotine (EQ NICOTINE) 14 mg/24hr patch Place 1 patch (14 mg total) onto the skin daily.  28 patch  0  . senna-docusate (SENOKOT-S) 8.6-50 MG per tablet Take 1 tablet by mouth 2 (two) times daily.      . traZODone (DESYREL) 25 mg TABS tablet Take 0.5-1 tablets (25-50 mg total) by mouth at bedtime as needed for sleep.       No current facility-administered medications on file prior to visit.   Family History  Problem  Relation Age of Onset  . Diabetes Father   . Diabetes Sister    History   Social History  . Marital Status: Single    Spouse Name: N/A    Number of Children: N/A  . Years of Education: N/A   Occupational History  . Not on file.   Social History Main Topics  . Smoking status: Current Every Day Smoker -- 2.00 packs/day for 40 years    Types: Cigarettes  . Smokeless tobacco: Never Used  . Alcohol Use: 27.6 oz/week    42 Cans of beer, 4 Shots of liquor per week     Comment: "drinks all day long"  . Drug Use: Yes    Special: Cocaine  . Sexual Activity: Not Currently   Other Topics Concern  . Not on file   Social History Narrative  . No narrative on file   Review of Systems  HENT:       Fast speech  Neurological: Positive for dizziness.     Objective:   Filed Vitals:   11/19/13 1638  BP: 124/85  Pulse: 64  Temp: 97.4 F (36.3 C)  Resp: 14    Physical Exam  Constitutional: She  is oriented to person, place, and time.  Eyes: Conjunctivae are normal. No scleral icterus.  Neck: Normal range of motion. Neck supple.  Cardiovascular: Normal rate, regular rhythm and normal heart sounds.   Pulmonary/Chest: Effort normal and breath sounds normal.  Abdominal: Soft. Bowel sounds are normal. She exhibits no distension.  Musculoskeletal: Normal range of motion. She exhibits no edema and no tenderness.  No weakness noted   Neurological: She is alert and oriented to person, place, and time.  '  Lab Results  Component Value Date   WBC 5.9 09/08/2013   HGB 14.3 09/08/2013   HCT 42.4 09/08/2013   MCV 106.8* 09/08/2013   PLT 131* 09/08/2013   Lab Results  Component Value Date   CREATININE 0.80 09/10/2013   BUN 9 09/10/2013   NA 144 09/10/2013   K 4.5 09/10/2013   CL 106 09/10/2013   CO2 25 09/10/2013    Lab Results  Component Value Date   HGBA1C 5.0 03/30/2013   Lipid Panel     Component Value Date/Time   CHOL 114 03/30/2013 1624   TRIG 84 08/18/2013 1931   HDL 39*  03/30/2013 1624   CHOLHDL 2.9 03/30/2013 1624   VLDL 17 03/30/2013 1624   LDLCALC 58 03/30/2013 1624       Assessment and plan:   [name] was seen today for hepatitis c.  Diagnoses and associated orders for this visit:  Hepatitis C virus infection without hepatic coma, unspecified chronicity - Ambulatory referral to Infectious Disease  Insomnia - zolpidem (AMBIEN) 5 MG tablet; Take 1 tablet (5 mg total) by mouth at bedtime as needed for sleep.  Seasonal allergies - loratadine (CLARITIN) 10 MG tablet; Take 1 tablet (10 mg total) by mouth daily.       Chari Manning, NP-C [facility] [phone] 11/19/2013, 5:15 PM

## 2013-12-02 ENCOUNTER — Other Ambulatory Visit: Payer: Medicaid Other

## 2013-12-02 DIAGNOSIS — B182 Chronic viral hepatitis C: Secondary | ICD-10-CM

## 2013-12-03 LAB — CBC WITH DIFFERENTIAL/PLATELET
Basophils Absolute: 0 10*3/uL (ref 0.0–0.1)
Basophils Relative: 1 % (ref 0–1)
EOS PCT: 4 % (ref 0–5)
Eosinophils Absolute: 0.2 10*3/uL (ref 0.0–0.7)
HEMATOCRIT: 40.1 % (ref 36.0–46.0)
Hemoglobin: 14.3 g/dL (ref 12.0–15.0)
LYMPHS PCT: 49 % — AB (ref 12–46)
Lymphs Abs: 1.9 10*3/uL (ref 0.7–4.0)
MCH: 35 pg — AB (ref 26.0–34.0)
MCHC: 35.7 g/dL (ref 30.0–36.0)
MCV: 98 fL (ref 78.0–100.0)
MONO ABS: 0.2 10*3/uL (ref 0.1–1.0)
MONOS PCT: 5 % (ref 3–12)
Neutro Abs: 1.6 10*3/uL — ABNORMAL LOW (ref 1.7–7.7)
Neutrophils Relative %: 41 % — ABNORMAL LOW (ref 43–77)
Platelets: 128 10*3/uL — ABNORMAL LOW (ref 150–400)
RBC: 4.09 MIL/uL (ref 3.87–5.11)
RDW: 13.8 % (ref 11.5–15.5)
WBC: 3.9 10*3/uL — ABNORMAL LOW (ref 4.0–10.5)

## 2013-12-03 LAB — COMPREHENSIVE METABOLIC PANEL
ALT: 144 U/L — AB (ref 0–35)
AST: 126 U/L — ABNORMAL HIGH (ref 0–37)
Albumin: 3.6 g/dL (ref 3.5–5.2)
Alkaline Phosphatase: 69 U/L (ref 39–117)
BUN: 7 mg/dL (ref 6–23)
CALCIUM: 9.4 mg/dL (ref 8.4–10.5)
CHLORIDE: 106 meq/L (ref 96–112)
CO2: 26 mEq/L (ref 19–32)
Creat: 0.69 mg/dL (ref 0.50–1.10)
Glucose, Bld: 128 mg/dL — ABNORMAL HIGH (ref 70–99)
Potassium: 3.7 mEq/L (ref 3.5–5.3)
Sodium: 139 mEq/L (ref 135–145)
Total Bilirubin: 1.5 mg/dL — ABNORMAL HIGH (ref 0.2–1.2)
Total Protein: 6.6 g/dL (ref 6.0–8.3)

## 2013-12-03 LAB — HEPATITIS B CORE ANTIBODY, TOTAL: HEP B C TOTAL AB: NONREACTIVE

## 2013-12-03 LAB — IRON: Iron: 280 ug/dL — ABNORMAL HIGH (ref 42–145)

## 2013-12-03 LAB — HEPATITIS A ANTIBODY, TOTAL: HEP A TOTAL AB: REACTIVE — AB

## 2013-12-03 LAB — PROTIME-INR
INR: 1.13 (ref <1.50)
PROTHROMBIN TIME: 14.5 s (ref 11.6–15.2)

## 2013-12-03 LAB — ANA: Anti Nuclear Antibody(ANA): NEGATIVE

## 2013-12-03 LAB — HEPATITIS B SURFACE ANTIGEN: Hepatitis B Surface Ag: NEGATIVE

## 2013-12-03 LAB — HEPATITIS B SURFACE ANTIBODY,QUALITATIVE: Hep B S Ab: POSITIVE — AB

## 2013-12-03 LAB — HIV ANTIBODY (ROUTINE TESTING W REFLEX): HIV 1&2 Ab, 4th Generation: NONREACTIVE

## 2013-12-05 LAB — HEPATITIS C RNA QUANTITATIVE
HCV Quantitative Log: 5.89 {Log} — ABNORMAL HIGH (ref <1.18)
HCV Quantitative: 773492 IU/mL — ABNORMAL HIGH (ref <15)

## 2013-12-10 LAB — HEPATITIS C GENOTYPE

## 2013-12-15 ENCOUNTER — Ambulatory Visit: Payer: Medicaid Other

## 2013-12-15 ENCOUNTER — Encounter: Payer: Self-pay | Admitting: Internal Medicine

## 2013-12-15 ENCOUNTER — Ambulatory Visit (INDEPENDENT_AMBULATORY_CARE_PROVIDER_SITE_OTHER): Payer: Medicaid Other | Admitting: Internal Medicine

## 2013-12-15 VITALS — BP 132/89 | HR 56 | Temp 98.1°F | Wt 171.0 lb

## 2013-12-15 DIAGNOSIS — B182 Chronic viral hepatitis C: Secondary | ICD-10-CM

## 2013-12-15 DIAGNOSIS — Z23 Encounter for immunization: Secondary | ICD-10-CM

## 2013-12-15 NOTE — Progress Notes (Signed)
   Subjective:    Patient ID: [name], female    DOB: [birth year], 58 y.o.   MRN: [id number]  HPI Here for follow up of HCV.  Previously seen in March 2015 and had current drug use then including cocaine and since then had a hemorrhagic stroke.  She also was cocaine positive then.  She endorses daily alcohol though reports no longer using cocaine.     Review of Systems  Constitutional: Negative for unexpected weight change.  Gastrointestinal: Negative for diarrhea.  Skin: Negative for rash.       Objective:   Physical Exam  Constitutional: She appears well-developed.  In wheelchair  Eyes: No scleral icterus.  Cardiovascular: Normal rate, regular rhythm and normal heart sounds.   No murmur heard. Skin: No rash noted.          Assessment & Plan:

## 2013-12-15 NOTE — Assessment & Plan Note (Addendum)
She has ongoing active alcohol and drug abuse and needs to be drug free, alcohol free and in counseling for at least 6 months before consideration of treatment.   We will again get her scheduled with our drug abuse counselor. I will get elastography after she has finished counseling prior to consideration of therapy, if applicable.   She can follow up when she has completed counseling and has continued support system

## 2014-01-13 ENCOUNTER — Ambulatory Visit: Payer: Medicaid Other

## 2014-01-14 ENCOUNTER — Other Ambulatory Visit: Payer: Self-pay

## 2014-01-20 ENCOUNTER — Ambulatory Visit: Payer: Medicaid Other

## 2014-01-27 ENCOUNTER — Ambulatory Visit: Payer: Medicaid Other

## 2014-02-03 ENCOUNTER — Ambulatory Visit: Payer: Self-pay | Admitting: Internal Medicine

## 2014-02-18 ENCOUNTER — Encounter: Payer: Self-pay | Admitting: Physical Medicine & Rehabilitation

## 2014-02-18 ENCOUNTER — Encounter: Payer: Medicaid Other | Attending: Physical Medicine & Rehabilitation | Admitting: Physical Medicine & Rehabilitation

## 2014-02-18 VITALS — BP 113/77 | HR 64 | Resp 14 | Ht 65.5 in | Wt 169.0 lb

## 2014-02-18 DIAGNOSIS — M47816 Spondylosis without myelopathy or radiculopathy, lumbar region: Secondary | ICD-10-CM | POA: Diagnosis present

## 2014-02-18 DIAGNOSIS — I618 Other nontraumatic intracerebral hemorrhage: Secondary | ICD-10-CM

## 2014-02-18 DIAGNOSIS — I69193 Ataxia following nontraumatic intracerebral hemorrhage: Secondary | ICD-10-CM | POA: Diagnosis present

## 2014-02-18 MED ORDER — METHOCARBAMOL 500 MG PO TABS: 500.0000 mg | ORAL_TABLET | Freq: Three times a day (TID) | ORAL | Status: DC | PRN

## 2014-02-18 NOTE — Progress Notes (Signed)
Subjective:    Patient ID: [name], female    DOB: 1956/02/19, 58 y.o.   MRN: [id number]  HPI  [name] is back regarding her right cerebellar hemorrhage. She has been making progress although she remains a bit impulsive per family. She is walking more. She is getting out of her w/c.  She is having pain in her low back near the waist line. It doesn't radiate. It bothers her more when she coughs or sneezes. It doesn't bother her when she stands or sits. It does bother a bit when she lays down at night onher back. She tends to sleep on her right side.      Pain Inventory Average Pain 8 Pain Right Now 0 My pain is sharp, burning and stabbing  In the last 24 hours, has pain interfered with the following? General activity 0 Relation with others 0 Enjoyment of life 0 What TIME of day is your pain at its worst? night Sleep (in general) Fair  Pain is worse with: unsure Pain improves with: no selection Relief from Meds: not taking pain meds  Mobility use a walker  Function disabled: date disabled 05/2011 I need assistance with the following:  bathing, meal prep, household duties and shopping  Neuro/Psych trouble walking dizziness confusion depression anxiety  Prior Studies Any changes since last visit?  no bone scan x-rays CT/MRI nerve study  Physicians involved in your care Any changes since last visit?  no   Family History  Problem Relation Age of Onset  . Diabetes Father   . Diabetes Sister    History   Social History  . Marital Status: Single    Spouse Name: N/A    Number of Children: N/A  . Years of Education: N/A   Social History Main Topics  . Smoking status: Current Every Day Smoker -- 1.00 packs/day for 40 years    Types: Cigarettes  . Smokeless tobacco: Never Used     Comment: cutting back  . Alcohol Use: 27.6 oz/week    42 Cans of beer, 4 Shots of liquor per week     Comment: "drinks all day long"2-40 beers  . Drug Use: Yes      Special: Cocaine     Comment: last used 11/29/13  . Sexual Activity: Not Currently   Other Topics Concern  . None   Social History Narrative   Past Surgical History  Procedure Laterality Date  . Neck surgery    . Shoulder surgery Bilateral   . Craniectomy N/A 08/18/2013    Procedure: CRANIECTOMY POSTERIOR FOSSA DECOMPRESSION, EVACUATION OF HEMORRHAGE;  Surgeon: Consuella Lose, MD;  Location: [facility];  Service: Neurosurgery;  Laterality: N/A;   Past Medical History  Diagnosis Date  . Hepatitis C   . Pneumonia   . Hypertension   . Diabetes mellitus without complication    BP 854/62 mmHg  Pulse 64  Resp 14  Ht 5' 5.5" (1.664 m)  Wt 169 lb (76.658 kg)  BMI 27.69 kg/m2  SpO2 95%  Opioid Risk Score:   Fall Risk Score: Moderate Fall Risk (6-13 points) Review of Systems  Neurological: Positive for dizziness.  Psychiatric/Behavioral: Positive for confusion and dysphoric mood. The patient is nervous/anxious.        Objective:   Physical Exam  Constitutional: nondistressed.  HENT:  Head: Normocephalic and atraumatic.  adentulous  Eyes: Conjunctivae are normal. Pupils are equal, round, and reactive to light. Right eye exhibits no discharge and no exudate. Left  eye exhibits no discharge and no exudate. Right conjunctiva is not injected. Right eye exhibits nystagmus. Left eye exhibits nystagmus.  Neck: Normal range of motion. Neck supple.  Cardiovascular: Normal rate and regular rhythm.  Respiratory: Effort normal and breath sounds normal.  GI: Soft. Bowel sounds are normal. She exhibits no distension. There is no tenderness.  Musculoskeletal: She exhibits no edema and no tenderness.  Neurological: She is alert and oriented to person, place, and time.  She is able to follow basic commands without difficulty. She displays moderate dysarthria. improved but persistent ataxia right arm and leg.. Motor is 5/5 in bilateral delt, bi, tri , grip, HF, KE, ADF  Skin:  intact Psych: improved affect. Pleasant, impulsive however.   Assessment/Plan:  1. Functional deficits secondary to Right paracentral cerebellar ICH  -reviewed safety, gait techniques today.   -still needs to work on impulsivity with gait and swallowing - no ETOH, drugs, tobacco reinforced   3. Pain Management: likely lumbar spondylosis with spasms particularly on the right side  -likely exacerbated by ataxia and poor habits  -heat, ice, robaxin  -xrays of lumbar spine 4. H/o depression/Mood: prozac  6. HTN: Continue lopressor bid. Keep an eye on DBP  7. Hep C: Not a candidate for treatment due to ongoing drug abuse.    Fifteen minutes of face to face patient care time were spent during this visit. All questions were encouraged and answered.  Follow up with me in 3 months. Marland Kitchen

## 2014-02-18 NOTE — Patient Instructions (Signed)
TAKE YOUR TIME AND USE GOOD POSTURE.  WORK ON STRETCHING EVERY DAY  TAKE YOUR TIME WHEN YOU EAT  USE HEAT AND ICE FOR YOUR BACK TO HELP WITH PAIN

## 2014-03-10 ENCOUNTER — Ambulatory Visit: Payer: Medicaid Other

## 2014-03-16 ENCOUNTER — Ambulatory Visit: Payer: Medicaid Other

## 2014-03-22 ENCOUNTER — Ambulatory Visit: Payer: Medicaid Other

## 2014-04-04 ENCOUNTER — Other Ambulatory Visit: Payer: Self-pay | Admitting: Internal Medicine

## 2014-04-19 ENCOUNTER — Encounter: Payer: Medicaid Other | Attending: Physical Medicine & Rehabilitation | Admitting: Physical Medicine & Rehabilitation

## 2014-04-19 DIAGNOSIS — I69193 Ataxia following nontraumatic intracerebral hemorrhage: Secondary | ICD-10-CM | POA: Insufficient documentation

## 2014-04-19 DIAGNOSIS — M47816 Spondylosis without myelopathy or radiculopathy, lumbar region: Secondary | ICD-10-CM | POA: Insufficient documentation

## 2014-04-21 ENCOUNTER — Ambulatory Visit: Payer: Medicaid Other

## 2014-04-21 ENCOUNTER — Telehealth: Payer: Self-pay | Admitting: *Deleted

## 2014-04-21 DIAGNOSIS — F1010 Alcohol abuse, uncomplicated: Secondary | ICD-10-CM

## 2014-04-21 NOTE — Telephone Encounter (Signed)
Need appt.asap.

## 2014-04-21 NOTE — Telephone Encounter (Signed)
Called the patient to give her the appt time and info had to leave a message will also mail her the information.

## 2014-04-21 NOTE — Telephone Encounter (Signed)
Per request from Leveda Anna counselor at [facility] and the patient she has made great strides with her drinking issues and wants to go forward with Hep C treatment. She feels the patient is at least ready to see the doctor and be evaluated for therapy. Advised them i will send the doctor a message and if he is ok will call the patient with an appt.

## 2014-05-18 ENCOUNTER — Other Ambulatory Visit: Payer: Self-pay | Admitting: Internal Medicine

## 2014-05-18 ENCOUNTER — Ambulatory Visit (INDEPENDENT_AMBULATORY_CARE_PROVIDER_SITE_OTHER): Payer: Medicaid Other | Admitting: Internal Medicine

## 2014-05-18 ENCOUNTER — Encounter: Payer: Self-pay | Admitting: Internal Medicine

## 2014-05-18 VITALS — BP 158/96 | HR 72 | Temp 98.1°F | Wt 181.0 lb

## 2014-05-18 DIAGNOSIS — B182 Chronic viral hepatitis C: Secondary | ICD-10-CM

## 2014-05-18 NOTE — Assessment & Plan Note (Addendum)
He has completed the requirements for readiness. She did sign. She will get elastography and once that result returns will start the process for medication. She will return after starting the medication.

## 2014-05-18 NOTE — Progress Notes (Signed)
   Subjective:    Patient ID: Simona Huh, female    DOB: [birth year], 59 y.o.   MRN: [id number]  HPI She comes in for follow-up visit. She has hepatitis C, genotype 1A, viral load of 773,000. She is hepatitis A and B immune. She has had recent alcohol use and has been under care of our substance abuse counselor. She's made good progress and does have a commitment to abstinence today. Medicaid readiness form was signed. She takes her medication daily for blood pressure. She has no complaints today. She is here with her nephew who is helping her. She has other family members who give her social support.   Review of Systems  Constitutional: Negative for fatigue.  Gastrointestinal: Negative for nausea and diarrhea.  Skin: Negative for rash.  Neurological: Negative for dizziness, light-headedness and headaches.       Objective:   Physical Exam  Constitutional: She appears well-developed and well-nourished. No distress.  HENT:  Mouth/Throat: No oropharyngeal exudate.  Eyes: No scleral icterus.  Cardiovascular: Normal rate, regular rhythm and normal heart sounds.   No murmur heard. Pulmonary/Chest: Effort normal and breath sounds normal. No respiratory distress.  Skin: No rash noted.          Assessment & Plan:

## 2014-05-18 NOTE — Patient Instructions (Signed)
Date 05/18/2014 Dear Ms Dina Rich, As discussed in the [facility], your hepatitis C therapy will include the following medications:          Harvoni 90mg /400mg  tablet:           Take 1 tablet by mouth once daily   Please note that ALL MEDICATIONS WILL START ON THE SAME DATE for a total of 12 weeks. ---------------------------------------------------------------- Your HCV Treatment Start Date: TBA   Your HCV genotype:  1a    Liver Fibrosis: TBD    ---------------------------------------------------------------- YOUR PHARMACY CONTACT:   [facility] [place] Phone: [phone] Hours: Monday to Friday 7:30 am to 6:00 pm   Please always contact your pharmacy at least 3-4 business days before you run out of medications to ensure your next month's medication is ready or 1 week prior to running out if you receive it by mail.  Remember, each prescription is for 28 days. ---------------------------------------------------------------- GENERAL NOTES REGARDING YOUR HEPATITIS C MEDICATION:  VIEKIRA PAK contains 2 different types of tablets. You must take both types of tablets exactly as prescribed, to treat your chronic hepatitis C virus (HCV) infection. . the pink tablet contains: the medicines ombitasvir, paritaprevir, and ritonavir . the beige tablet contains: the medicine dasabuvir  How should I take VIEKIRA PAK? Marland Kitchen When you receive your VIEKIRA PAK prescription, you will get a monthly carton that contains enough medicine for 28 days. . Each monthly carton of VIEKIRA PAK contains 4 smaller cartons.  . Each of the 4 smaller cartons contains enough child resistant daily dose packs of medicine to last for 7 days (1 week). . Each daily dose pack contains all of your VIEKIRA PAK medicine for 1 day (4 tablets). Follow the instructions on each daily dose pack about how to remove the tablets. . Take VIEKIRA PAK tablets with a  meal as follows: ? take the 2 pink tablets (ombitasvir, paritaprevir, and ritonavir), with 1 of the beige tablets (dasabuvir), at about the same time every morning. ? take the second beige tablet (dasabuvir), at about the same time every evening. . If you miss a dose of the pink tablets, and it is less than 12 hours from the time you usually take your dose, take the missed dose with a meal as soon as possible. Then take your next dose at your usual time with a meal. . If you miss a dose of the pink tablets, and it is more than 12 hours from the time you usually take your dose, do not take the missed dose. Take your next dose at your usual time with a meal. . If you miss a dose of the beige tablet, and it is less than 6 hours from the time you usually take your dose, take the missed dose with a meal as soon as possible. Then take your next dose at your usual time with a meal. . If you miss a dose of the beige tablet, and it is more than 6 hours from the time you usually take your dose, do not take the missed dose. Take your next dose at your usual time with a meal. . Do not take more than your prescribed dose of VIEKIRA PAK to make up for a missed dose. . If you take too much VIEKIRA PAK, cal  Ribavirin is an additional medication required and is taken twice a day. -3 capsules in the morning -2 or 3 capsules in the  evening  Common side effects: 1. Nausea 2. Itching 3. Insomnia 4. Fatigue  Do not take with the following drugs: alfuzosin HCL; colchicine; carbamazepine, phenytoin, phenobarbital; gemfibrozil; rifampin; ergotamine, dihydroergotamine, ergonovine, methylergonovine; ethinyl estradiol-containing medicines, such as combined oral contraceptives; St. John's Wort (Hypericum perforatum); lovastatin, simvastatin; pimozide; efavirenz; sildenafil (when dosed as Revatio* for pulmonary arterial hypertension); triazolam and oral midazolam.  Call [phone] [phone]) for  financial assistance in certain cases. ---------------------------------------------------------------- GENERAL HELPFUL HINTS ON HCV THERAPY: 1. No alcohol. 2. Protect against sun-sensitivity/sunburns (wear sunglasses, hat, long sleeves, pants and sunscreen). 3. Stay well-hydrated/well-moisturized. 4. Notify the [facility] of any changes in your other over-the-counter/herbal or prescription medications. 5. If you miss a dose of your medication, take the missed dose as soon as you remember. Return to your regular time/dose schedule the next day.  6.  Do not stop taking your medications without first talking with your healthcare provider. 7.  You may take Tylenol (acetaminophen), as long as the dose is less than 2000 mg (OR no more than 4 tablets of the Tylenol Extra Strengths 500mg  tablet) in 24 hours. 8.  You will need to obtain routine labs and/or office visits at [facility] at weeks 4, and 12 as well as 12 weeks after completion of treatment.   Scharlene Gloss, [facility] [place], [place]  [phone]

## 2014-05-25 ENCOUNTER — Ambulatory Visit: Payer: Medicaid Other | Attending: Internal Medicine | Admitting: Internal Medicine

## 2014-05-25 ENCOUNTER — Encounter: Payer: Self-pay | Admitting: Internal Medicine

## 2014-05-25 VITALS — BP 145/97 | HR 62 | Temp 98.2°F | Resp 15 | Wt 182.0 lb

## 2014-05-25 DIAGNOSIS — F17210 Nicotine dependence, cigarettes, uncomplicated: Secondary | ICD-10-CM | POA: Insufficient documentation

## 2014-05-25 DIAGNOSIS — I10 Essential (primary) hypertension: Secondary | ICD-10-CM | POA: Diagnosis not present

## 2014-05-25 DIAGNOSIS — I6930 Unspecified sequelae of cerebral infarction: Secondary | ICD-10-CM

## 2014-05-25 DIAGNOSIS — Z79899 Other long term (current) drug therapy: Secondary | ICD-10-CM | POA: Insufficient documentation

## 2014-05-25 DIAGNOSIS — K219 Gastro-esophageal reflux disease without esophagitis: Secondary | ICD-10-CM

## 2014-05-25 LAB — LIPID PANEL
CHOL/HDL RATIO: 3.8 ratio
Cholesterol: 127 mg/dL (ref 0–200)
HDL: 33 mg/dL — ABNORMAL LOW (ref >=46)
LDL Cholesterol: 77 mg/dL (ref 0–99)
Triglycerides: 85 mg/dL (ref <150)
VLDL: 17 mg/dL (ref 0–40)

## 2014-05-25 MED ORDER — FAMOTIDINE 10 MG PO TABS: 10.0000 mg | ORAL_TABLET | Freq: Two times a day (BID) | ORAL | Status: DC

## 2014-05-25 MED ORDER — METOPROLOL TARTRATE 25 MG PO TABS
12.5000 mg | ORAL_TABLET | Freq: Two times a day (BID) | ORAL | Status: DC
Start: 2014-05-25 — End: 2014-10-18

## 2014-05-25 NOTE — Progress Notes (Signed)
Patient states here for follow up on her blood pressure and for medications refills

## 2014-05-25 NOTE — Patient Instructions (Signed)
Smoking Cessation Quitting smoking is important to your health and has many advantages. However, it is not always easy to quit since nicotine is a very addictive drug. Oftentimes, people try 3 times or more before being able to quit. This document explains the best ways for you to prepare to quit smoking. Quitting takes hard work and a lot of effort, but you can do it. ADVANTAGES OF QUITTING SMOKING  You will live longer, feel better, and live better.  Your body will feel the impact of quitting smoking almost immediately.  Within 20 minutes, blood pressure decreases. Your pulse returns to its normal level.  After 8 hours, carbon monoxide levels in the blood return to normal. Your oxygen level increases.  After 24 hours, the chance of having a heart attack starts to decrease. Your breath, hair, and body stop smelling like smoke.  After 48 hours, damaged nerve endings begin to recover. Your sense of taste and smell improve.  After 72 hours, the body is virtually free of nicotine. Your bronchial tubes relax and breathing becomes easier.  After 2 to 12 weeks, lungs can hold more air. Exercise becomes easier and circulation improves.  The risk of having a heart attack, stroke, cancer, or lung disease is greatly reduced.  After 1 year, the risk of coronary heart disease is cut in half.  After 5 years, the risk of stroke falls to the same as a nonsmoker.  After 10 years, the risk of lung cancer is cut in half and the risk of other cancers decreases significantly.  After 15 years, the risk of coronary heart disease drops, usually to the level of a nonsmoker.  If you are pregnant, quitting smoking will improve your chances of having a healthy baby.  The people you live with, especially any children, will be healthier.  You will have extra money to spend on things other than cigarettes. QUESTIONS TO THINK ABOUT BEFORE ATTEMPTING TO QUIT You may want to talk about your answers with your  health care provider.  Why do you want to quit?  If you tried to quit in the past, what helped and what did not?  What will be the most difficult situations for you after you quit? How will you plan to handle them?  Who can help you through the tough times? Your family? Friends? A health care provider?  What pleasures do you get from smoking? What ways can you still get pleasure if you quit? Here are some questions to ask your health care provider:  How can you help me to be successful at quitting?  What medicine do you think would be best for me and how should I take it?  What should I do if I need more help?  What is smoking withdrawal like? How can I get information on withdrawal? GET READY  Set a quit date.  Change your environment by getting rid of all cigarettes, ashtrays, matches, and lighters in your home, car, or work. Do not let people smoke in your home.  Review your past attempts to quit. Think about what worked and what did not. GET SUPPORT AND ENCOURAGEMENT You have a better chance of being successful if you have help. You can get support in many ways.  Tell your family, friends, and coworkers that you are going to quit and need their support. Ask them not to smoke around you.  Get individual, group, or telephone counseling and support. Programs are available at local hospitals and health centers. Call   your local health department for information about programs in your area.  Spiritual beliefs and practices may help some smokers quit.  Download a "quit meter" on your computer to keep track of quit statistics, such as how long you have gone without smoking, cigarettes not smoked, and money saved.  Get a self-help book about quitting smoking and staying off tobacco. [place] yourself from urges to smoke. Talk to someone, go for a walk, or occupy your time with a task.  Change your normal routine. Take a different route to work.  Drink tea instead of coffee. Eat breakfast in a different place.  Reduce your stress. Take a hot bath, exercise, or read a book.  Plan something enjoyable to do every day. Reward yourself for not smoking.  Explore interactive web-based programs that specialize in helping you quit. GET MEDICINE AND USE IT CORRECTLY Medicines can help you stop smoking and decrease the urge to smoke. Combining medicine with the above behavioral methods and support can greatly increase your chances of successfully quitting smoking.  Nicotine replacement therapy helps deliver nicotine to your body without the negative effects and risks of smoking. Nicotine replacement therapy includes nicotine gum, lozenges, inhalers, nasal sprays, and skin patches. Some may be available over-the-counter and others require a prescription.  Antidepressant medicine helps people abstain from smoking, but how this works is unknown. This medicine is available by prescription.  Nicotinic receptor partial agonist medicine simulates the effect of nicotine in your brain. This medicine is available by prescription. Ask your health care provider for advice about which medicines to use and how to use them based on your health history. Your health care provider will tell you what side effects to look out for if you choose to be on a medicine or therapy. Carefully read the information on the package. Do not use any other product containing nicotine while using a nicotine replacement product.  RELAPSE OR DIFFICULT SITUATIONS Most relapses occur within the first 3 months after quitting. Do not be discouraged if you start smoking again. Remember, most people try several times before finally quitting. You may have symptoms of withdrawal because your body is used to nicotine. You may crave cigarettes, be irritable, feel very hungry, cough often, get headaches, or have difficulty concentrating. The withdrawal symptoms are only temporary. They are strongest  when you first quit, but they will go away within 10-14 days. To reduce the chances of relapse, try to:  Avoid drinking alcohol. Drinking lowers your chances of successfully quitting.  Reduce the amount of caffeine you consume. Once you quit smoking, the amount of caffeine in your body increases and can give you symptoms, such as a rapid heartbeat, sweating, and anxiety.  Avoid smokers because they can make you want to smoke.  Do not let weight gain distract you. Many smokers will gain weight when they quit, usually less than 10 pounds. Eat a healthy diet and stay active. You can always lose the weight gained after you quit.  Find ways to improve your mood other than smoking. FOR MORE INFORMATION  www.smokefree.gov  Document Released: 03/12/2001 Document Revised: 08/02/2013 Document Reviewed: 06/27/2011 [facility] Patient Information 2015 [place], [place]. This information is not intended to replace advice given to you by your health care provider. Make sure you discuss any questions you have with your health care provider. Stroke Prevention Some medical conditions and behaviors are associated with an increased chance of having a stroke. You may prevent a stroke  by making healthy choices and managing medical conditions. HOW CAN I REDUCE MY RISK OF HAVING A STROKE?   Stay physically active. Get at least 30 minutes of activity on most or all days.  Do not smoke. It may also be helpful to avoid exposure to secondhand smoke.  Limit alcohol use. Moderate alcohol use is considered to be:  No more than 2 drinks per day for men.  No more than 1 drink per day for nonpregnant women.  Eat healthy foods. This involves:  Eating 5 or more servings of fruits and vegetables a day.  Making dietary changes that address high blood pressure (hypertension), high cholesterol, diabetes, or obesity.  Manage your cholesterol levels.  Making food choices that are high in fiber and low in saturated fat,  trans fat, and cholesterol may control cholesterol levels.  Take any prescribed medicines to control cholesterol as directed by your health care provider.  Manage your diabetes.  Controlling your carbohydrate and sugar intake is recommended to manage diabetes.  Take any prescribed medicines to control diabetes as directed by your health care provider.  Control your hypertension.  Making food choices that are low in salt (sodium), saturated fat, trans fat, and cholesterol is recommended to manage hypertension.  Take any prescribed medicines to control hypertension as directed by your health care provider.  Maintain a healthy weight.  Reducing calorie intake and making food choices that are low in sodium, saturated fat, trans fat, and cholesterol are recommended to manage weight.  Stop drug abuse.  Avoid taking birth control pills.  Talk to your health care provider about the risks of taking birth control pills if you are over 70 years old, smoke, get migraines, or have ever had a blood clot.  Get evaluated for sleep disorders (sleep apnea).  Talk to your health care provider about getting a sleep evaluation if you snore a lot or have excessive sleepiness.  Take medicines only as directed by your health care provider.  For some people, aspirin or blood thinners (anticoagulants) are helpful in reducing the risk of forming abnormal blood clots that can lead to stroke. If you have the irregular heart rhythm of atrial fibrillation, you should be on a blood thinner unless there is a good reason you cannot take them.  Understand all your medicine instructions.  Make sure that other conditions (such as anemia or atherosclerosis) are addressed. SEEK IMMEDIATE MEDICAL CARE IF:   You have sudden weakness or numbness of the face, arm, or leg, especially on one side of the body.  Your face or eyelid droops to one side.  You have sudden confusion.  You have trouble speaking (aphasia) or  understanding.  You have sudden trouble seeing in one or both eyes.  You have sudden trouble walking.  You have dizziness.  You have a loss of balance or coordination.  You have a sudden, severe headache with no known cause.  You have new chest pain or an irregular heartbeat. Any of these symptoms may represent a serious problem that is an emergency. Do not wait to see if the symptoms will go away. Get medical help at once. Call your local emergency services (911 in [place] Do not drive yourself to the hospital. Document Released: 04/25/2004 Document Revised: 08/02/2013 Document Reviewed: 09/18/2012 [facility] Patient Information 2015 [place], [place]. This information is not intended to replace advice given to you by your health care provider. Make sure you discuss any questions you have with your health care provider.

## 2014-05-25 NOTE — Progress Notes (Signed)
Patient ID: [name], female   DOB: September 16, 1955, 59 y.o.   MRN: [id number] Subjective:  [name] is a 59 y.o. female with hypertension. Current Outpatient Prescriptions  Medication Sig Dispense Refill  . famotidine (PEPCID) 10 MG tablet Take 1 tablet (10 mg total) by mouth 2 (two) times daily.    Marland Kitchen FLUoxetine (PROZAC) 20 MG capsule Take 1 capsule (20 mg total) by mouth daily. (Patient not taking: Reported on 05/18/2014) 30 capsule 3  . hydrOXYzine (ATARAX/VISTARIL) 25 MG tablet Take 25 mg by mouth at bedtime as needed.    . metoprolol tartrate (LOPRESSOR) 25 MG tablet TAKE 1/2 TABLET BY MOUTH TWICE A DAY 60 tablet 5   No current facility-administered medications for this visit.    Hypertension ROS: taking medications as instructed, no medication side effects noted, no TIA's, no chest pain on exertion, no dyspnea on exertion, no swelling of ankles and no palpitations.  New concerns: Reports that she has been taking the whole 25 mg Meteprolol at one time and has still noticed elevated BP. She states that the Infectious disease doctor appt her BP was 779 systolic. Reports that she has been on Meteprolol for over one year. Reports that she has been living with her aunt who stresses her out which may be increasing her pressure.   Had hemorrhagic stroke, does not take aspirin. Weakness on right side with tremors. Speech difficulties. She reports that she had a half of a can of beer for breakfast this morning. She still continues to smoke a 0.5 ppd. Is in physical therapy.  Objective:  BP 160/98 mmHg  Pulse 62  Temp(Src) 98.2 F (36.8 C)  Resp 15  Wt 182 lb (82.555 kg)  SpO2 100%  Appearance alert, well appearing, and in no distress, oriented to person, place, and time and slurred speech likely from alcohol use and stroke residual. General exam BP noted to be mildly elevated today in office though normal at other visits, S1, S2 normal, no gallop, no murmur, chest clear, no JVD, no  HSM, no edema, CVS exam  - normal rate, regular rhythm, normal S1, S2, no murmurs, rubs, clicks or gallops, normal bilateral carotid upstroke without bruits, no JVD, uses walker for ambulation, unsteady gait.  Lab review: orders written for new lab studies as appropriate; see orders.   Assessment:   Hypertension needs improvement.   Plan:  Reviewed diet, exercise and weight control. Recommended sodium restriction. Very strongly urged to quit smoking to reduce cardiovascular risk. Cardiovascular risk and specific lipid/LDL goals reviewed. quit all alcohol use, will have her f/u with BP check when she has not had any alcohol.  Return in about 2 weeks (around 06/08/2014) for Nurse Visit-BP check and 3 mo HTN.  Chari Manning, NP 06/14/2014 06/14/2014

## 2014-05-27 ENCOUNTER — Telehealth: Payer: Self-pay | Admitting: *Deleted

## 2014-05-27 NOTE — Telephone Encounter (Signed)
-----   Message from Lance Bosch, NP sent at 05/26/2014  8:37 PM EST ----- Cholesterol is normal,

## 2014-05-27 NOTE — Telephone Encounter (Signed)
Pt aware of lab results 

## 2014-06-07 ENCOUNTER — Ambulatory Visit (HOSPITAL_COMMUNITY)
Admission: RE | Admit: 2014-06-07 | Discharge: 2014-06-07 | Disposition: A | Discharge status: Home / Self Care | Payer: Medicaid Other | Source: Ambulatory Visit | Attending: Internal Medicine | Admitting: Internal Medicine

## 2014-06-07 ENCOUNTER — Other Ambulatory Visit: Payer: Self-pay | Admitting: Internal Medicine

## 2014-06-07 DIAGNOSIS — N2889 Other specified disorders of kidney and ureter: Secondary | ICD-10-CM | POA: Diagnosis not present

## 2014-06-07 DIAGNOSIS — B182 Chronic viral hepatitis C: Secondary | ICD-10-CM | POA: Insufficient documentation

## 2014-06-07 MED ORDER — LEDIPASVIR-SOFOSBUVIR 90-400 MG PO TABS: 1.0000 | ORAL_TABLET | Freq: Every day | ORAL | Status: AC

## 2014-06-10 ENCOUNTER — Ambulatory Visit: Payer: Medicaid Other | Attending: Internal Medicine | Admitting: Pharmacist

## 2014-06-10 VITALS — BP 139/90 | HR 65 | Resp 15

## 2014-06-10 DIAGNOSIS — I10 Essential (primary) hypertension: Secondary | ICD-10-CM

## 2014-06-10 MED ORDER — METOPROLOL SUCCINATE ER 25 MG PO TB24: 25.0000 mg | ORAL_TABLET | Freq: Two times a day (BID) | ORAL | Status: DC

## 2014-06-10 NOTE — Progress Notes (Signed)
Patient here for blood pressure check Blood pressure is stable Spoke with valerie keck Np and per patient She is taking metoprolol 25mg  BID Updated in epic

## 2014-06-27 ENCOUNTER — Telehealth: Payer: Self-pay | Admitting: Emergency Medicine

## 2014-06-27 NOTE — Telephone Encounter (Signed)
Paperwork is ready to be picked up, Pt requested I fax same to [facility], Which I did.

## 2014-09-26 ENCOUNTER — Other Ambulatory Visit: Payer: Self-pay

## 2014-10-14 ENCOUNTER — Telehealth: Payer: Self-pay | Admitting: Internal Medicine

## 2014-10-14 ENCOUNTER — Ambulatory Visit: Payer: Medicaid Other | Admitting: Internal Medicine

## 2014-10-14 NOTE — Telephone Encounter (Signed)
Pt is requesting a refill for metoprolol tartrate (LOPRESSOR) 25 MG tablet. Please follow up with pt. She uses [facility].

## 2014-10-14 NOTE — Telephone Encounter (Signed)
Pt is stating that her assessment to receive AIDES income from [facility] was denied because it was mentioned on her assessment that she is not able to live on her own.... Pt states that she has been living on her own since July 1st. Pt is wanting pcp to send assessment once again indicating that she is able to live on her own. Please follow up with pt. I tried looking for paperwork at the front desk but could not find it, we may need to follow up with [facility] and ask if they received it and if not then ask that they send [place] another one for dr Feliciana Rossetti to fill out.

## 2014-10-14 NOTE — Telephone Encounter (Signed)
Pt mentioned that [employer] did not receive the complete application and she is wanting [place] to re-fax this to them. I did not see the application scanned into the system so we might need to request another application to be filled out. Please follow up with pt.

## 2014-10-17 ENCOUNTER — Other Ambulatory Visit: Payer: Self-pay | Admitting: Internal Medicine

## 2014-10-17 DIAGNOSIS — I10 Essential (primary) hypertension: Secondary | ICD-10-CM

## 2014-10-18 ENCOUNTER — Ambulatory Visit: Payer: Medicaid Other | Attending: Internal Medicine | Admitting: Internal Medicine

## 2014-10-18 ENCOUNTER — Encounter: Payer: Self-pay | Admitting: Internal Medicine

## 2014-10-18 VITALS — BP 130/84 | HR 67 | Temp 98.5°F | Resp 18 | Ht 65.5 in | Wt 181.4 lb

## 2014-10-18 DIAGNOSIS — Z720 Tobacco use: Secondary | ICD-10-CM | POA: Insufficient documentation

## 2014-10-18 DIAGNOSIS — R202 Paresthesia of skin: Secondary | ICD-10-CM | POA: Diagnosis not present

## 2014-10-18 DIAGNOSIS — I10 Essential (primary) hypertension: Secondary | ICD-10-CM | POA: Diagnosis present

## 2014-10-18 MED ORDER — METOPROLOL TARTRATE 25 MG PO TABS: 25.0000 mg | ORAL_TABLET | Freq: Two times a day (BID) | ORAL | Status: DC

## 2014-10-18 NOTE — Patient Instructions (Addendum)
Call Infectious disease to see if there is a alternate treatment for your Hepatitis C  Call back in 2 weeks to make sure [employer] paper and home health form has been completed   I will call you with results of your blood. If there is no definite cause I will send you something to help with the burning and tingling

## 2014-10-18 NOTE — Progress Notes (Signed)
Patient ID: [name], female   DOB: 01-17-1956, 59 y.o.   MRN: [id number] Subjective:  [name] is a 59 y.o. female with hypertension.  Patient reports that she has been having pain in the bottom of her feet at night with burning and itching as well. She states that this has been a ongoing problem for several years but she now it is becoming more of a problem for her.  Current Outpatient Prescriptions  Medication Sig Dispense Refill  . metoprolol succinate (TOPROL-XL) 25 MG 24 hr tablet TAKE ONE TABLET BY MOUTH TWICE DAILY 60 tablet 0  . famotidine (PEPCID) 10 MG tablet Take 1 tablet (10 mg total) by mouth 2 (two) times daily. (Patient not taking: Reported on 10/18/2014) 60 tablet 4  . FLUoxetine (PROZAC) 20 MG capsule Take 1 capsule (20 mg total) by mouth daily. (Patient not taking: Reported on 05/18/2014) 30 capsule 3  . hydrOXYzine (ATARAX/VISTARIL) 25 MG tablet Take 25 mg by mouth at bedtime as needed.    . Ledipasvir-Sofosbuvir (HARVONI) 90-400 MG TABS Take 1 tablet by mouth daily. (Patient not taking: Reported on 10/18/2014) 28 tablet 2  . metoprolol tartrate (LOPRESSOR) 25 MG tablet Take 0.5 tablets (12.5 mg total) by mouth 2 (two) times daily. (Patient not taking: Reported on 10/18/2014) 60 tablet 5   No current facility-administered medications for this visit.    Hypertension ROS: taking medications as instructed, no medication side effects noted, no TIA's, no chest pain on exertion, no dyspnea on exertion and no swelling of ankles. No polyuria/dipsia, blurred vision   Objective:  BP 130/84 mmHg  Pulse 67  Temp(Src) 98.5 F (36.9 C) (Oral)  Resp 18  Ht 5' 5.5" (1.664 m)  Wt 181 lb 6.4 oz (82.283 kg)  BMI 29.72 kg/m2  SpO2 97%  Appearance alert, well appearing, and in no distress and oriented to person, place, and time. General exam BP noted to be well controlled today in office, S1, S2 normal, no gallop, no murmur, chest clear, no JVD, no HSM, no edema.  Lab  review: labs are reviewed, up to date and normal.   Assessment:   Hypertension well controlled and needs to follow diet more regularly.  Tingling in extremities. Patient had a normal vitamin b12 level, is not diabetic. Will check potassium level. If no cause is found, I will begin on Gabapentin   Plan:  Current treatment plan is effective, no change in therapy. Recommended sodium restriction..  Return in about 6 months (around 04/20/2015) for Hypertension.  Lance Bosch, NP 10/19/2014 9:59 AM

## 2014-10-18 NOTE — Progress Notes (Signed)
Patient here for follow up for HTN. Patient BP is 130/84. Patient denies any pain today. Patient reports pain in the bottom of her feet at night. Patient reports her feet burn and itch at night. Patient states she needs her [facility] papers and [facility] paper work completed.

## 2014-10-19 LAB — BASIC METABOLIC PANEL
BUN: 7 mg/dL (ref 6–23)
CALCIUM: 9.6 mg/dL (ref 8.4–10.5)
CHLORIDE: 106 meq/L (ref 96–112)
CO2: 23 mEq/L (ref 19–32)
Creat: 0.66 mg/dL (ref 0.50–1.10)
GLUCOSE: 88 mg/dL (ref 70–99)
Potassium: 3.9 mEq/L (ref 3.5–5.3)
SODIUM: 142 meq/L (ref 135–145)

## 2014-10-19 LAB — HEMOGLOBIN A1C
HEMOGLOBIN A1C: 5.3 % (ref <5.7)
MEAN PLASMA GLUCOSE: 105 mg/dL (ref <117)

## 2014-10-21 ENCOUNTER — Telehealth: Payer: Self-pay

## 2014-10-21 ENCOUNTER — Other Ambulatory Visit: Payer: Self-pay | Admitting: Internal Medicine

## 2014-10-21 DIAGNOSIS — G629 Polyneuropathy, unspecified: Secondary | ICD-10-CM

## 2014-10-21 MED ORDER — GABAPENTIN 100 MG PO CAPS
100.0000 mg | ORAL_CAPSULE | Freq: Two times a day (BID) | ORAL | Status: DC
Start: 2014-10-21 — End: 2015-02-22

## 2014-10-21 NOTE — Telephone Encounter (Signed)
Sent gabapentin. I still have not seen paperwork from [facility]

## 2014-10-21 NOTE — Telephone Encounter (Signed)
Nurse called patient, patient verified date of birth. Patient aware of normal labs and would like gabapentin to be sent to [facility], listed as one of her pharmacy choices. Patient asking questions about [facility].

## 2014-10-21 NOTE — Telephone Encounter (Signed)
-----   Message from Lance Bosch, NP sent at 10/19/2014  7:08 PM EDT ----- Labs are normal. IF she continues to have tingling in feet we can try gabapentin for that. Please explain and let me know if patient would like to try medication

## 2014-10-24 ENCOUNTER — Telehealth: Payer: Self-pay | Admitting: Internal Medicine

## 2014-10-24 NOTE — Telephone Encounter (Signed)
Patient called stating that the paper work her PCP signed for [facility] was not correct. The patient stated that she needs an aide but that was not on the paperwork. Please f/u with pt.

## 2014-10-31 ENCOUNTER — Emergency Department (HOSPITAL_COMMUNITY)
Admission: EM | Admit: 2014-10-31 | Discharge: 2014-10-31 | Disposition: A | Discharge status: Home / Self Care | Payer: Medicaid Other | Attending: Emergency Medicine | Admitting: Emergency Medicine

## 2014-10-31 ENCOUNTER — Emergency Department (HOSPITAL_COMMUNITY): Payer: Medicaid Other

## 2014-10-31 ENCOUNTER — Encounter (HOSPITAL_COMMUNITY): Payer: Self-pay | Admitting: Emergency Medicine

## 2014-10-31 DIAGNOSIS — I10 Essential (primary) hypertension: Secondary | ICD-10-CM | POA: Insufficient documentation

## 2014-10-31 DIAGNOSIS — Y998 Other external cause status: Secondary | ICD-10-CM | POA: Insufficient documentation

## 2014-10-31 DIAGNOSIS — S62101A Fracture of unspecified carpal bone, right wrist, initial encounter for closed fracture: Secondary | ICD-10-CM

## 2014-10-31 DIAGNOSIS — Y92000 Kitchen of unspecified non-institutional (private) residence as  the place of occurrence of the external cause: Secondary | ICD-10-CM | POA: Diagnosis not present

## 2014-10-31 DIAGNOSIS — Z8619 Personal history of other infectious and parasitic diseases: Secondary | ICD-10-CM | POA: Diagnosis not present

## 2014-10-31 DIAGNOSIS — W1839XA Other fall on same level, initial encounter: Secondary | ICD-10-CM | POA: Insufficient documentation

## 2014-10-31 DIAGNOSIS — S6991XA Unspecified injury of right wrist, hand and finger(s), initial encounter: Secondary | ICD-10-CM | POA: Diagnosis present

## 2014-10-31 DIAGNOSIS — Z79899 Other long term (current) drug therapy: Secondary | ICD-10-CM | POA: Diagnosis not present

## 2014-10-31 DIAGNOSIS — S62001A Unspecified fracture of navicular [scaphoid] bone of right wrist, initial encounter for closed fracture: Secondary | ICD-10-CM | POA: Insufficient documentation

## 2014-10-31 DIAGNOSIS — Y9301 Activity, walking, marching and hiking: Secondary | ICD-10-CM | POA: Diagnosis not present

## 2014-10-31 DIAGNOSIS — Z8701 Personal history of pneumonia (recurrent): Secondary | ICD-10-CM | POA: Diagnosis not present

## 2014-10-31 DIAGNOSIS — E119 Type 2 diabetes mellitus without complications: Secondary | ICD-10-CM | POA: Diagnosis not present

## 2014-10-31 DIAGNOSIS — Z720 Tobacco use: Secondary | ICD-10-CM | POA: Diagnosis not present

## 2014-10-31 HISTORY — DX: Cerebral infarction, unspecified: I63.9

## 2014-10-31 MED ORDER — OXYCODONE-ACETAMINOPHEN 5-325 MG PO TABS
1.0000 | ORAL_TABLET | Freq: Once | ORAL | Status: AC
  Administered 2014-10-31: 1 via ORAL
  Filled 2014-10-31: qty 1

## 2014-10-31 MED ORDER — HYDROCODONE-ACETAMINOPHEN 5-325 MG PO TABS: 1.0000 | ORAL_TABLET | ORAL | Status: DC | PRN

## 2014-10-31 NOTE — ED Notes (Signed)
Ortho tech at the bedside.  

## 2014-10-31 NOTE — ED Provider Notes (Signed)
CSN: [id number]     Arrival date & time 10/31/14  0353 History   First MD Initiated Contact with Patient 10/31/14 0410     Chief Complaint  Patient presents with  . Fall  . Wrist Pain     HPI Patient reports falling in the kitchen after attempting to walk with her walker and landing on her right wrist.  She reports pain to her right wrist.  No other injury.  No other complaints.  Pain is mild to moderate in severity and worse with palpation and movement of her right wrist.   Past Medical History  Diagnosis Date  . Hepatitis C   . Pneumonia   . Hypertension   . Diabetes mellitus without complication    Past Surgical History  Procedure Laterality Date  . Neck surgery    . Shoulder surgery Bilateral   . Craniectomy N/A 08/18/2013    Procedure: CRANIECTOMY POSTERIOR FOSSA DECOMPRESSION, EVACUATION OF HEMORRHAGE;  Surgeon: Consuella Lose, MD;  Location: [facility];  Service: Neurosurgery;  Laterality: N/A;   Family History  Problem Relation Age of Onset  . Diabetes Father   . Diabetes Sister    History  Substance Use Topics  . Smoking status: Current Every Day Smoker -- 1.00 packs/day for 40 years    Types: Cigarettes  . Smokeless tobacco: Never Used     Comment: cutting back  . Alcohol Use: 4.2 oz/week    7 Cans of beer per week     Comment: 1 beer/day   OB History    No data available     Review of Systems  All other systems reviewed and are negative.     Allergies  Ibuprofen  Home Medications   Prior to Admission medications   Medication Sig Start Date End Date Taking? Authorizing Provider  famotidine (PEPCID) 10 MG tablet Take 1 tablet (10 mg total) by mouth 2 (two) times daily. Patient not taking: Reported on 10/18/2014 05/25/14   Lance Bosch, NP  FLUoxetine (PROZAC) 20 MG capsule Take 1 capsule (20 mg total) by mouth daily. Patient not taking: Reported on 05/18/2014 03/30/13   Reyne Dumas, MD  gabapentin (NEURONTIN) 100 MG capsule Take 1 capsule  (100 mg total) by mouth 2 (two) times daily. 10/21/14   Lance Bosch, NP  hydrOXYzine (ATARAX/VISTARIL) 25 MG tablet Take 25 mg by mouth at bedtime as needed.    Historical Provider, MD  Ledipasvir-Sofosbuvir (HARVONI) 90-400 MG TABS Take 1 tablet by mouth daily. Patient not taking: Reported on 10/18/2014 06/07/14   Thayer Headings, MD  metoprolol tartrate (LOPRESSOR) 25 MG tablet Take 1 tablet (25 mg total) by mouth 2 (two) times daily. 10/18/14   Lance Bosch, NP   BP 120/85 mmHg  Pulse 61  Temp(Src) 98.3 F (36.8 C) (Oral)  Resp 20  SpO2 99% Physical Exam  Constitutional: She is oriented to person, place, and time. She appears well-developed and well-nourished.  HENT:  Head: Normocephalic.  Eyes: EOM are normal.  Neck: Normal range of motion.  Pulmonary/Chest: Effort normal.  Abdominal: She exhibits no distension.  Musculoskeletal: Normal range of motion.  Tenderness of anatomic snuffbox on the right.  Normal function of all 5 fingers.  No tenderness of the radius or ulna distally  Neurological: She is alert and oriented to person, place, and time.  Psychiatric: She has a normal mood and affect.  Nursing note and vitals reviewed.   ED Course  Procedures (including critical care time)  Labs Review Labs Reviewed - No data to display  Imaging Review Dg Wrist Complete Right  10/31/2014   CLINICAL DATA:  Right wrist pain after falling  EXAM: RIGHT WRIST - COMPLETE 3+ VIEW  COMPARISON:  None.  FINDINGS: There is a nondisplaced scaphoid waist fracture. There is no other area of suspicion for acute fracture. There is no radiopaque foreign body.  IMPRESSION: Scaphoid waist fracture   Electronically Signed   By: Andreas Newport M.D.   On: 10/31/2014 04:30   Dg Hand Complete Right  10/31/2014   RIGHT CLINICAL DATA: Right wrist pain after falling down  EXAM: RIGHT HAND - COMPLETE 3+ VIEW  COMPARISON:  None.  FINDINGS: The scaphoid waist fracture is again evident, seen to better advantage on  the accompanying wrist series. No additional fractures are evident about the right hand. There is no dislocation.  IMPRESSION: Scaphoid waist fracture, seen to better advantage on the accompanying wrist series. Negative for additional acute fracture about the hand   Electronically Signed   By: Andreas Newport M.D.   On: 10/31/2014 04:31  I personally reviewed the imaging tests through PACS system I reviewed available ER/hospitalization records through the EMR    EKG Interpretation None      MDM   Final diagnoses:  None    Scaphoid wrist fracture.  Thumb spica.  Orthopedic hand surgery follow-up    Jola Schmidt, MD 10/31/14 [id number]

## 2014-10-31 NOTE — Progress Notes (Signed)
Orthopedic Tech Progress Note Patient Details:  [name] 1955/05/29 [id number]  Ortho Devices Type of Ortho Device: Ace wrap, Thumb spica splint Splint Material: Plaster Ortho Device/Splint Location: RUE Ortho Device/Splint Interventions: Application   Asia R Thompson 10/31/2014, 5:44 AM

## 2014-10-31 NOTE — ED Notes (Signed)
Pt returned from xray

## 2014-10-31 NOTE — ED Notes (Signed)
Pt has a thumb spica splint and uses a walker and states she will not be able to take the bus without help. Her neighbor rode in with her and brought the walker and will be riding back with her in order to assist her.

## 2014-10-31 NOTE — ED Notes (Signed)
Ortho paged for thumb spica

## 2014-10-31 NOTE — Discharge Instructions (Signed)
Wrist Fracture A wrist fracture is a break or crack in one of the bones of your wrist. Your wrist is made up of eight small bones at the palm of your hand (carpal bones) and two long bones that make up your forearm (radius and ulna).  CAUSES   A direct blow to the wrist.  Falling on an outstretched hand.  Trauma, such as a car accident or a fall. RISK FACTORS Risk factors for wrist fracture include:   Participating in contact and high-risk sports, such as skiing, biking, and ice skating.  Taking steroid medicines.  Smoking.  Being female.  Being Caucasian.  Drinking more than three alcoholic beverages per day.  Having low or lowered bone density (osteoporosis or osteopenia).  Age. Older adults have decreased bone density.  Women who have had menopause.  History of previous fractures. SIGNS AND SYMPTOMS Symptoms of wrist fractures include tenderness, bruising, and inflammation. Additionally, the wrist may hang in an odd position or appear deformed.  DIAGNOSIS Diagnosis may include:  Physical exam.  X-ray. TREATMENT Treatment depends on many factors, including the nature and location of the fracture, your age, and your activity level. Treatment for wrist fracture can be nonsurgical or surgical.  Nonsurgical Treatment A plaster cast or splint may be applied to your wrist if the bone is in a good position. If the fracture is not in good position, it may be necessary for your health care provider to realign it before applying a splint or cast. Usually, a cast or splint will be worn for several weeks.  Surgical Treatment Sometimes the position of the bone is so far out of place that surgery is required to apply a device to hold it together as it heals. Depending on the fracture, there are a number of options for holding the bone in place while it heals, such as a cast and metal pins.  HOME CARE INSTRUCTIONS  Keep your injured wrist elevated and move your fingers as much as  possible.  Do not put pressure on any part of your cast or splint. It may break.   Use a plastic bag to protect your cast or splint from water while bathing or showering. Do not lower your cast or splint into water.  Take medicines only as directed by your health care provider.  Keep your cast or splint clean and dry. If it becomes wet, damaged, or suddenly feels too tight, contact your health care provider right away.  Do not use any tobacco products including cigarettes, chewing tobacco, or electronic cigarettes. Tobacco can delay bone healing. If you need help quitting, ask your health care provider.  Keep all follow-up visits as directed by your health care provider. This is important.  Ask your health care provider if you should take supplements of calcium and vitamins C and D to promote bone healing. SEEK MEDICAL CARE IF:   Your cast or splint is damaged, breaks, or gets wet.  You have a fever.  You have chills.  You have continued severe pain or more swelling than you did before the cast was put on. SEEK IMMEDIATE MEDICAL CARE IF:   Your hand or fingernails on the injured arm turn blue or gray, or feel cold or numb.  You have decreased feeling in the fingers of your injured arm. MAKE SURE YOU:  Understand these instructions.  Will watch your condition.  Will get help right away if you are not doing well or get worse. Document Released: 12/26/2004 Document Revised:   08/02/2013 Document Reviewed: 04/05/2011 ExitCare Patient Information 2015 [employer]. This information is not intended to replace advice given to you by your health care provider. Make sure you discuss any questions you have with your health care provider.  

## 2014-10-31 NOTE — ED Notes (Signed)
Pt arrives via EMS with c/o wrist pain post fall. States hx of stroke and R sided weakness. No head injury, LOC. Deformity present to R wrist, PMS intact. Pt reports 9/10 pain. Prefers to be called [name].

## 2014-11-01 ENCOUNTER — Telehealth: Payer: Self-pay | Admitting: Internal Medicine

## 2014-11-01 NOTE — Telephone Encounter (Signed)
Patient called requesting to speak to nurse regarding aide, patient states she was in the ED yesterday 10/31/14 with wrist fracture. Please f/u with patient

## 2014-11-02 NOTE — Telephone Encounter (Signed)
Patient called requesting to speak to nurse regarding aide, patient states she was in the ED yesterday 10/31/14 with wrist fracture. Please f/u with patient  Patient states she is in need of aide ASAP

## 2014-11-09 ENCOUNTER — Ambulatory Visit: Payer: Medicaid Other | Admitting: Internal Medicine

## 2014-11-17 ENCOUNTER — Ambulatory Visit: Payer: Medicaid Other | Admitting: Internal Medicine

## 2014-12-02 ENCOUNTER — Telehealth: Payer: Self-pay | Admitting: Internal Medicine

## 2014-12-02 NOTE — Telephone Encounter (Signed)
Patients Care Manager is checking on the status of patients [facility] paperwork. Please follow up with [name] if further details are needed, as patient has had a stroke and it is difficult to communicate with pt. Thank you.

## 2014-12-12 NOTE — Telephone Encounter (Signed)
error 

## 2014-12-14 ENCOUNTER — Telehealth: Payer: Self-pay | Admitting: Internal Medicine

## 2014-12-14 NOTE — Telephone Encounter (Signed)
Patients case manager called wanting to f/u on patients [facility] paperwork. Please f/u

## 2014-12-30 ENCOUNTER — Telehealth: Payer: Self-pay | Admitting: Internal Medicine

## 2014-12-30 NOTE — Telephone Encounter (Signed)
[facility] called to request more information for her application. They need to know what the patient has been diagnosed with in order to complete the application. Please f/u

## 2015-01-09 ENCOUNTER — Ambulatory Visit: Payer: Medicaid Other | Attending: Internal Medicine | Admitting: Internal Medicine

## 2015-01-09 ENCOUNTER — Encounter: Payer: Self-pay | Admitting: Internal Medicine

## 2015-01-09 VITALS — BP 134/86 | HR 56 | Temp 98.0°F | Resp 16 | Ht 66.0 in | Wt 178.4 lb

## 2015-01-09 DIAGNOSIS — I10 Essential (primary) hypertension: Secondary | ICD-10-CM | POA: Diagnosis not present

## 2015-01-09 DIAGNOSIS — Z833 Family history of diabetes mellitus: Secondary | ICD-10-CM | POA: Insufficient documentation

## 2015-01-09 DIAGNOSIS — Z8673 Personal history of transient ischemic attack (TIA), and cerebral infarction without residual deficits: Secondary | ICD-10-CM | POA: Diagnosis not present

## 2015-01-09 DIAGNOSIS — M25569 Pain in unspecified knee: Secondary | ICD-10-CM | POA: Diagnosis not present

## 2015-01-09 DIAGNOSIS — E119 Type 2 diabetes mellitus without complications: Secondary | ICD-10-CM | POA: Diagnosis not present

## 2015-01-09 DIAGNOSIS — F17210 Nicotine dependence, cigarettes, uncomplicated: Secondary | ICD-10-CM | POA: Diagnosis not present

## 2015-01-09 DIAGNOSIS — G588 Other specified mononeuropathies: Secondary | ICD-10-CM | POA: Insufficient documentation

## 2015-01-09 DIAGNOSIS — F10129 Alcohol abuse with intoxication, unspecified: Secondary | ICD-10-CM | POA: Insufficient documentation

## 2015-01-09 DIAGNOSIS — Z720 Tobacco use: Secondary | ICD-10-CM | POA: Diagnosis not present

## 2015-01-09 DIAGNOSIS — Z79899 Other long term (current) drug therapy: Secondary | ICD-10-CM | POA: Diagnosis not present

## 2015-01-09 DIAGNOSIS — B1920 Unspecified viral hepatitis C without hepatic coma: Secondary | ICD-10-CM | POA: Diagnosis not present

## 2015-01-09 DIAGNOSIS — R202 Paresthesia of skin: Secondary | ICD-10-CM | POA: Diagnosis present

## 2015-01-09 MED ORDER — VITAMIN B-1 100 MG PO TABS: 100.0000 mg | ORAL_TABLET | Freq: Every day | ORAL | Status: DC

## 2015-01-09 MED ORDER — FOLIC ACID 1 MG PO TABS: 1.0000 mg | ORAL_TABLET | Freq: Every day | ORAL | Status: DC

## 2015-01-09 NOTE — Patient Instructions (Addendum)
Please begin taking Thiamine and Folic acid today. This will help with your burning and tingling in your feet. Please stop smoking/drinking asap  Alcohol Abuse and Nutrition Alcohol abuse is any pattern of alcohol consumption that harms your health, relationships, or work. Alcohol abuse can affect how your body breaks down and absorbs nutrients from food by causing your liver to work abnormally. Additionally, many people who abuse alcohol do not eat enough carbohydrates, protein, fat, vitamins, and minerals. This can cause poor nutrition (malnutrition) and a lack of nutrients (nutrient deficiencies), which can lead to further complications. Nutrients that are commonly lacking (deficient) among people who abuse alcohol include:  Vitamins.  Vitamin A. This is stored in your liver. It is important for your vision, metabolism, and ability to fight off infections (immunity).  B vitamins. These include vitamins such as folate, thiamin, and niacin. These are important in new cell growth and maintenance.  Vitamin C. This plays an important role in iron absorption, wound healing, and immunity.  Vitamin D. This is produced by your liver, but you can also get vitamin D from food. Vitamin D is necessary for your body to absorb and use calcium.  Minerals.  Calcium. This is important for your bones and your heart and blood vessel (cardiovascular) function.  Iron. This is important for blood, muscle, and nervous system functioning.  Magnesium. This plays an important role in muscle and nerve function, and it helps to control blood sugar and blood pressure.  Zinc. This is important for the normal function of your nervous system and digestive system (gastrointestinal tract). Nutrition is an essential component of therapy for alcohol abuse. Your health care provider or dietitian will work with you to design a plan that can help restore nutrients to your body and prevent potential complications. WHAT IS MY  PLAN? Your dietitian may develop a specific diet plan that is based on your condition and any other complications you may have. A diet plan will commonly include:  A balanced diet.  Grains: 6-8 oz per day.  Vegetables: 2-3 cups per day.  Fruits: 1-2 cups per day.  Meat and other protein: 5-6 oz per day.  Dairy: 2-3 cups per day.  Vitamin and mineral supplements. WHAT DO I NEED TO KNOW ABOUT ALCOHOL AND NUTRITION?  Consume foods that are high in antioxidants, such as grapes, berries, nuts, green tea, and dark green and orange vegetables. This can help to counteract some of the stress that is placed on your liver by consuming alcohol.  Avoid food and drinks that are high in fat and sugar. Foods such as sugared soft drinks, salty snack foods, and candy contain empty calories. This means that they lack important nutrients such as protein, fiber, and vitamins.  Eat frequent meals and snacks. Try to eat 5-6 small meals each day.  Eat a variety of fresh fruits and vegetables each day. This will help you get plenty of water, fiber, and vitamins in your diet.  Drink plenty of water and other clear fluids. Try to drink at least 48-64 oz (1.5-2 L) of water per day.  If you are a vegetarian, eat a variety of protein-rich foods. Pair whole grains with plant-based proteins at meals and snacks to obtain the greatest nutrient benefit from your food. For example, eat rice with beans, put peanut butter on whole-grain toast, or eat oatmeal with sunflower seeds.  Soak beans and whole grains overnight before cooking. This can help your body to absorb the nutrients more easily.  Include foods fortified with vitamins and minerals in your diet. Commonly fortified foods include milk, orange juice, cereal, and bread.  If you are malnourished, your dietitian may recommend a high-protein, high-calorie diet. This may include:  2,000-3,000 calories (kilocalories) per day.  70-100 grams of protein per  day.  Your health care provider may recommend a complete nutritional supplement beverage. This can help to restore calories, protein, and vitamins to your body. Depending on your condition, you may be advised to consume this instead of or in addition to meals.  Limit your intake of caffeine. Replace drinks like coffee and black tea with decaffeinated coffee and herbal tea.  Eat a variety of foods that are high in omega fatty acids. These include fish, nuts and seeds, and soybeans. These foods may help your liver to recover and may also stabilize your mood.  Certain medicines may cause changes in your appetite, taste, and weight. Work with your health care provider and dietitian to make any adjustments to your medicines and diet plan.  Include other healthy lifestyle choices in your daily routine.  Be physically active.  Get enough sleep.  Spend time doing activities that you enjoy.  If you are unable to take in enough food and calories by mouth, your health care provider may recommend a feeding tube. This is a tube that passes through your nose and throat, directly into your stomach. Nutritional supplement beverages can be given to you through the feeding tube to help you get the nutrients you need.  Take vitamin or mineral supplements as recommended by your health care provider. WHAT FOODS CAN I EAT? Grains Enriched pasta. Enriched rice. Fortified whole-grain bread. Fortified whole-grain cereal. Barley. Brown rice. Quinoa. Latimer. Vegetables All fresh, frozen, and canned vegetables. Spinach. Kale. Artichoke. Carrots. Winter squash and pumpkin. Sweet potatoes. Broccoli. Cabbage. Cucumbers. Tomatoes. Sweet peppers. Green beans. Peas. Corn. Fruits All fresh and frozen fruits. Berries. Grapes. Mango. Papaya. Guava. Cherries. Apples. Bananas. Peaches. Plums. Pineapple. Watermelon. Cantaloupe. Oranges. Avocado. Meats and Other Protein Sources Beef liver. Lean beef. Pork. Fresh and canned  chicken. Fresh fish. Oysters. Sardines. Canned tuna. Shrimp. Eggs with yolks. Nuts and seeds. Peanut butter. Beans and lentils. Soybeans. Tofu. Dairy Whole, low-fat, and nonfat milk. Whole, low-fat, and nonfat yogurt. Cottage cheese. Sour cream. Hard and soft cheeses. Beverages Water. Herbal tea. Decaffeinated coffee. Decaffeinated green tea. 100% fruit juice. 100% vegetable juice. Instant breakfast shakes. Condiments Ketchup. Mayonnaise. Mustard. Salad dressing. Barbecue sauce. Sweets and Desserts Sugar-free ice cream. Sugar-free pudding. Sugar-free gelatin. Fats and Oils Butter. Vegetable oil, flaxseed oil, olive oil, and walnut oil. Other Complete nutrition shakes. Protein bars. Sugar-free gum. The items listed above may not be a complete list of recommended foods or beverages. Contact your dietitian for more options. WHAT FOODS ARE NOT RECOMMENDED? Grains Sugar-sweetened breakfast cereals. Flavored instant oatmeal. Fried breads. Vegetables Breaded or deep-fried vegetables. Fruits Dried fruit with added sugar. Candied fruit. Canned fruit in syrup. Meats and Other Protein Sources Breaded or deep-fried meats. Dairy Flavored milks. Fried cheese curds or fried cheese sticks. Beverages Alcohol. Sugar-sweetened soft drinks. Sugar-sweetened tea. Caffeinated coffee and tea. Condiments Sugar. Honey. Agave nectar. Molasses. Sweets and Desserts Chocolate. Cake. Cookies. Candy. Other Potato chips. Pretzels. Salted nuts. Candied nuts. The items listed above may not be a complete list of foods and beverages to avoid. Contact your dietitian for more information.   This information is not intended to replace advice given to you by your health care provider. Make sure you discuss  any questions you have with your health care provider.   Document Released: 01/10/2005 Document Revised: 04/08/2014 Document Reviewed: 10/19/2013 Elsevier Interactive Patient Education [employer].

## 2015-01-09 NOTE — Progress Notes (Signed)
Patient here for follow up Complains of bilateral foot pain with some burning States the gabapentin is not really working

## 2015-01-09 NOTE — Progress Notes (Signed)
Patient ID: [name], female   DOB: 10-02-1955, 59 y.o.   MRN: [id number]  CC: feet tingling   HPI: [name] is a 59 y.o. female here today for a follow up visit.  Patient has past medical history of Hepatitis C, HTN, T2DM, Stroke. Patient presents today with concerns of continued tingling and burning on the palmer surface of BLE. Patient's speech is slurred more than usual and appears intoxicated during physical. She states that she does drink a 40 oz of beer almost everyday and she continues to smoke 1 ppd of cigarettes. She states that she is not ready to quit either and does not feel drinking alcohol is causing her symptoms. She does admit to recently breaking her wrist after falling 2 months ago. She is currently being seen by a orthopedic hand surgeon.  Patient is requesting a referral to a orthopedics for knee pain. She states that she had steroid injections in the past and she felt like those helped her pain in the past.   Allergies  Allergen Reactions  . Ibuprofen Hives and Other (See Comments)    Burning of the skin.   Past Medical History  Diagnosis Date  . Hepatitis C   . Pneumonia   . Hypertension   . Diabetes mellitus without complication ([place])   . Stroke [facility])    Current Outpatient Prescriptions on File Prior to Visit  Medication Sig Dispense Refill  . famotidine (PEPCID) 10 MG tablet Take 1 tablet (10 mg total) by mouth 2 (two) times daily. 60 tablet 4  . gabapentin (NEURONTIN) 100 MG capsule Take 1 capsule (100 mg total) by mouth 2 (two) times daily. 60 capsule 0  . hydrOXYzine (ATARAX/VISTARIL) 25 MG tablet Take 25 mg by mouth at bedtime as needed.    . metoprolol tartrate (LOPRESSOR) 25 MG tablet Take 1 tablet (25 mg total) by mouth 2 (two) times daily. 60 tablet 5  . FLUoxetine (PROZAC) 20 MG capsule Take 1 capsule (20 mg total) by mouth daily. (Patient not taking: Reported on 05/18/2014) 30 capsule 3  . HYDROcodone-acetaminophen (NORCO/VICODIN) 5-325 MG  per tablet Take 1 tablet by mouth every 4 (four) hours as needed for moderate pain. 15 tablet 0  . Ledipasvir-Sofosbuvir (HARVONI) 90-400 MG TABS Take 1 tablet by mouth daily. (Patient not taking: Reported on 10/18/2014) 28 tablet 2   No current facility-administered medications on file prior to visit.   Family History  Problem Relation Age of Onset  . Diabetes Father   . Diabetes Sister    Social History   Social History  . Marital Status: Single    Spouse Name: N/A  . Number of Children: N/A  . Years of Education: N/A   Occupational History  . Not on file.   Social History Main Topics  . Smoking status: Current Every Day Smoker -- 1.00 packs/day for 40 years    Types: Cigarettes  . Smokeless tobacco: Never Used     Comment: cutting back  . Alcohol Use: 4.2 oz/week    7 Cans of beer per week     Comment: 1 beer/day  . Drug Use: No     Comment: last used 11/29/13  . Sexual Activity: Not Currently   Other Topics Concern  . Not on file   Social History Narrative    Review of Systems: Other than what is stated in HPI, all other systems are negative.    Objective:   Filed Vitals:   01/09/15 0926  BP:  134/86  Pulse: 56  Temp: 98 F (36.7 C)  Resp: 16    Physical Exam  Cardiovascular: Normal rate, regular rhythm and normal heart sounds.   Pulmonary/Chest: Effort normal and breath sounds normal.  Musculoskeletal: Normal range of motion. She exhibits no edema or tenderness.  Neurological:  Slurred speech, intoxicated  Psychiatric:  Loud and dismissive when asked about alcohol intake     Lab Results  Component Value Date   WBC 3.9* 12/02/2013   HGB 14.3 12/02/2013   HCT 40.1 12/02/2013   MCV 98.0 12/02/2013   PLT 128* 12/02/2013   Lab Results  Component Value Date   CREATININE 0.66 10/18/2014   BUN 7 10/18/2014   NA 142 10/18/2014   K 3.9 10/18/2014   CL 106 10/18/2014   CO2 23 10/18/2014    Lab Results  Component Value Date   HGBA1C 5.3  10/18/2014   Lipid Panel     Component Value Date/Time   CHOL 127 05/25/2014 1045   TRIG 85 05/25/2014 1045   HDL 33* 05/25/2014 1045   CHOLHDL 3.8 05/25/2014 1045   VLDL 17 05/25/2014 1045   LDLCALC 77 05/25/2014 1045       Assessment and plan:   [name] was seen today for follow-up.  Diagnoses and all orders for this visit:  Alcohol abuse with intoxication ([place]) -     Vitamin B1 -     Folate -     thiamine (VITAMIN B-1) 100 MG tablet; Take 1 tablet (100 mg total) by mouth daily. -     folic acid (FOLVITE) 1 MG tablet; Take 1 tablet (1 mg total) by mouth daily. All patients labs have been normal.   Other mononeuropathy I will start her on thiamine and folic acid. I have explained to her that alcohol abuse can eventually lead to nutritional deficits and can cause neuropathy. Patient reports that she does not believe me and feels like she does not drink enough for her to have problems. I have strongly advised her to quit especially with her history of a recent ICH. She still reports that she does not drink enough to cause problems and refuses counseling referral. I believe patient is at high risk for repeat events and hospitalization due to alcohol abuse.  Tobacco abuse Smoking cessation discussed for 3 minutes, patient is not willing to quit at this time. Will continue to assess on each visit. Discussed increased risk for diseases such as cancer, heart disease, and stroke.   Return in about 6 months (around 06/09/2015).        Lance Bosch, [facility] [phone] 01/09/2015, 9:54 AM

## 2015-01-10 ENCOUNTER — Telehealth: Payer: Self-pay

## 2015-01-10 LAB — FOLATE: FOLATE: 10.8 ng/mL

## 2015-01-10 NOTE — Telephone Encounter (Signed)
Called patient in reference to her lab work  Lab claimed they did not receive specimen Patient stated she will call back tomorrow to schedule a new appt

## 2015-01-11 LAB — SEDIMENTATION RATE

## 2015-01-11 LAB — VITAMIN B1

## 2015-01-12 NOTE — Telephone Encounter (Signed)
Nurse called [facility]. Per Anderson Malta, [facility] received diagnosis needed on 12/30/14.

## 2015-01-19 ENCOUNTER — Ambulatory Visit: Payer: Medicaid Other | Attending: Orthopedic Surgery | Admitting: Occupational Therapy

## 2015-01-19 DIAGNOSIS — M25541 Pain in joints of right hand: Secondary | ICD-10-CM

## 2015-01-19 DIAGNOSIS — M79644 Pain in right finger(s): Secondary | ICD-10-CM | POA: Diagnosis present

## 2015-01-19 DIAGNOSIS — M2560 Stiffness of unspecified joint, not elsewhere classified: Secondary | ICD-10-CM | POA: Diagnosis present

## 2015-01-19 DIAGNOSIS — M25531 Pain in right wrist: Secondary | ICD-10-CM | POA: Diagnosis present

## 2015-01-19 NOTE — Patient Instructions (Signed)
WEARING SCHEDULE:  ?Wear splint at ALL times except for hygiene care  ? ?PURPOSE:  ?To prevent movement and for protection until injury can heal ? ?CARE OF SPLINT:  ?Keep splint away from heat sources including: stove, radiator or furnace, or a car in sunlight. The splint can melt and will no longer fit you properly ? ?Keep away from pets and children ? ?Clean the splint with rubbing alcohol 1-2 times per day.  ?* During this time, make sure you also clean your hand/arm as instructed by your therapist and/or perform dressing changes as needed. Then dry hand/arm completely before replacing splint. (When cleaning hand/arm, keep it immobilized in same position until splint is replaced) ? ?PRECAUTIONS/POTENTIAL PROBLEMS: ?*If you notice or experience increased pain, swelling, numbness, or a lingering reddened area from the splint: ?Contact your therapist immediately by calling [phone]. You must wear the splint for protection, but we will get you scheduled for adjustments as quickly as possible.  ?(If only straps or hooks need to be replaced and NO adjustments to the splint need to be made, just call the office ahead and let them know you are coming in) ? ?If you have any medical concerns or signs of infection, please call your doctor immediately ?  ?

## 2015-01-19 NOTE — Therapy (Signed)
[facility] [address] [place], [place], [address] Phone: [phone]   Fax:  [phone]  Occupational Therapy Evaluation  Patient Details  Name: [name] MRN: [id number] Date of Birth: 09/22/1955 Referring Provider: Dr. Fredna Dow  Encounter Date: 01/19/2015      OT End of Session - 01/19/15 1309    Visit Number 1   Number of Visits 4   Date for OT Re-Evaluation 03/19/15   Authorization Type Medicaid, not a covered dx   OT Start Time 58   OT Stop Time 1124   OT Time Calculation (min) 65 min   Activity Tolerance Patient tolerated treatment well   Behavior During Therapy [facility] for tasks assessed/performed      Past Medical History  Diagnosis Date  . Hepatitis C   . Pneumonia   . Hypertension   . Diabetes mellitus without complication ([place])   . Stroke [facility])     Past Surgical History  Procedure Laterality Date  . Neck surgery    . Shoulder surgery Bilateral   . Craniectomy N/A 08/18/2013    Procedure: CRANIECTOMY POSTERIOR FOSSA DECOMPRESSION, EVACUATION OF HEMORRHAGE;  Surgeon: Consuella Lose, MD;  Location: [facility];  Service: Neurosurgery;  Laterality: N/A;    There were no vitals filed for this visit.  Visit Diagnosis:  Pain in thumb joint with movement, right - Plan: Ot plan of care cert/re-cert  Stiffness of joint - Plan: Ot plan of care cert/re-cert  Pain in wrist joint, right - Plan: Ot plan of care cert/re-cert      Subjective Assessment - 01/19/15 1644    Subjective  Pt reports she broke wrist in fall   Pertinent History see Epic   Patient Stated Goals splint   Currently in Pain? Yes   Pain Score 5    Pain Location --  thumb   Pain Orientation Right   Pain Descriptors / Indicators Aching   Pain Type Chronic pain   Pain Onset More than a month ago   Aggravating Factors  movments   Pain Relieving Factors inactivity   Effect of Pain on Daily Activities limits activity    Multiple Pain Sites No           [facility] OT Assessment - 01/19/15 0001    Assessment   Diagnosis right scaphoid fx   Referring Provider Dr. Fredna Dow   Onset Date 10/31/14   Assessment Pt reports she fell and broke her scaphoid.   Precautions   Precautions Fall   Precaution Comments splint at all times except for hygine   Balance Screen   Has the patient fallen in the past 6 months Yes   How many times? 1   Has the patient had a decrease in activity level because of a fear of falling?  No   Is the patient reluctant to leave their home because of a fear of falling?  No   Home  Environment   Lives With Daughter  has aide who assists   Prior Function   Level of Independence Needs assistance with ADLs;Needs assistance with homemaking   ADL   ADL comments Pt is performing with LUE, she has assistance from an aide, and her dtr.   Mobility   Mobility Status Independent  with platform walker   ROM / Strength   AROM / PROM / Strength AROM   AROM   Overall AROM  Deficits;Unable to assess;Due to precautions;Due to pain  OT Treatments/Exercises (OP) - 01/19/15 0001    Splinting   Splinting Pt arrived in protective cast. Pt was carefully unwrapped and hand was washed with soap and water and dried thoroughly. Pt was fitted with a thumb spica splint . Pt was educated in splint wear, care and precautions. she verbalized understanding.               OT Education - 01/19/15 1628    Education provided Yes   Education Details splint wear, care and precautions   Person(s) Educated Patient   Methods Explanation;Demonstration;Verbal cues;Handout   Comprehension Verbalized understanding;Returned demonstration             OT Long Term Goals - 01/19/15 1315    OT LONG TERM GOAL #1   Title I with splint wear, care and precautions following 1-2 weeks of wear to ensure proper fit   Time 8   Period Weeks   Status New               Plan -  01/19/15 1311    Clinical Impression Statement Pt s/p scaphoid fx to RUE on 10/31/14 presents with pain and decreased functional use. Pt can benefit from skilled occupational therapy for splinting.   Rehab Potential Good   OT Frequency --  4 visits   OT Duration 8 weeks   OT Treatment/Interventions Self-care/ADL training;Splinting;Patient/family education   Plan splint check and modification PRN   Consulted and Agree with Plan of Care Patient        Problem List Patient Active Problem List   Diagnosis Date Noted  . 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 Spondylosis of lumbar region without myelopathy or radiculopathy 02/18/2014  . Adjustment disorder with mixed anxiety and depressed mood 09/09/2013  . Thrombocytopenia, unspecified ([place]) 09/09/2013  . ICH (intracerebral hemorrhage) ([place]) 08/26/2013  . Cytotoxic cerebral edema ([place]) 08/20/2013  . Brain herniation ([place]) 08/20/2013  . Cerebellar hemorrhage, nontraumatic ([facility]) 08/18/2013  . Chronic hepatitis C without hepatic coma ([place]) 08/03/2013  . Loss of weight 08/03/2013  . Arthritis 05/03/2013  . Influenza with respiratory manifestations 05/03/2013    RINE,KATHRYN 01/19/2015, 4:46 PM Theone Murdoch, OTR/L [phone] Phone: [phone] 4:46 PM 01/19/2015 [place] [address] [place], [place], [address] Phone: [phone]   Fax:  [phone]  Name: [name] MRN: [id number] Date of Birth: 1956-02-05

## 2015-01-26 ENCOUNTER — Telehealth: Payer: Self-pay

## 2015-01-26 NOTE — Telephone Encounter (Signed)
Nurse called Tyson Alias, case manager to see if there are any paperwork needs at this time.  Reached voicemail, left message to return call to [facility] with [facility] at [phone].

## 2015-01-30 NOTE — Telephone Encounter (Signed)
Nurse called patient, reached voicemail. Left message for patient to call Morgann Woodburn with [facility], at [phone].  

## 2015-02-02 ENCOUNTER — Other Ambulatory Visit: Payer: Self-pay

## 2015-02-02 ENCOUNTER — Other Ambulatory Visit: Payer: Self-pay | Admitting: Internal Medicine

## 2015-02-02 DIAGNOSIS — I69193 Ataxia following nontraumatic intracerebral hemorrhage: Secondary | ICD-10-CM

## 2015-02-02 NOTE — Telephone Encounter (Signed)
[name] aware of prescription for wheelchair and ensure will be at front office to be picked up.

## 2015-02-02 NOTE — Telephone Encounter (Signed)
i will print order. You can fax it to advance or have them to pick it up

## 2015-02-02 NOTE — Telephone Encounter (Signed)
[name] with [facility] called nurse. Per [name], patient needs wheelchair, Ensure and Boost. Nurse will put Ensure in bag for [name] to pick up. Nurse will send message to provider concerning wheelchair.  Patient has no other needs at this time per [facility]

## 2015-02-13 ENCOUNTER — Other Ambulatory Visit: Payer: Self-pay

## 2015-02-13 DIAGNOSIS — R634 Abnormal weight loss: Secondary | ICD-10-CM

## 2015-02-13 MED ORDER — ENSURE PO LIQD: 237.0000 mL | Freq: Two times a day (BID) | ORAL | Status: AC

## 2015-02-13 NOTE — Telephone Encounter (Signed)
Nurse called patient, patient verified date of birth. Patient aware of nurse sending ensure prescription to pharmacy per [place] request. Patient voices understanding and has no further questions at this time.

## 2015-02-22 ENCOUNTER — Telehealth: Payer: Self-pay | Admitting: Internal Medicine

## 2015-02-22 ENCOUNTER — Other Ambulatory Visit: Payer: Self-pay | Admitting: Internal Medicine

## 2015-02-22 NOTE — Telephone Encounter (Signed)
Nurse received call from a person explaining they are patients "aid" and patient needs wheelchair prescription and ensure. Nurse informed caller, nurse will call patient.  Nurse called [name]. [name] picked up prescription and ensure earlier in November. Nurse left message for [name] to return call to [facility] with [facility] at [phone]. Nurse called patient, patient verified date of birth. Patient requesting aid to pick up Ensure. Nurse explained to patient, ensure will be in bag with front office staff to be picked up.  Patient voices understanding and has no further questions at this time.

## 2015-02-22 NOTE — Telephone Encounter (Signed)
Pt calling to follow up on Ensure prescription.

## 2015-02-28 ENCOUNTER — Telehealth: Payer: Self-pay

## 2015-02-28 NOTE — Telephone Encounter (Signed)
-----   Message from [place] sent at 02/21/2015  5:34 PM EST ----- Pt would like to pick up Ensure when she comes in next, please call her to let her know if that will be available for pickup and/or if she needs a prescription.

## 2015-02-28 NOTE — Telephone Encounter (Signed)
Spoke with patient this am and she is aware she can stop by the office to  Pick up a bag of ensure Will leave with the front office staff

## 2015-03-23 ENCOUNTER — Telehealth: Payer: Self-pay | Admitting: Internal Medicine

## 2015-03-23 NOTE — Telephone Encounter (Signed)
Pt. Called stating that she was giving a prescription for a wheelchair, and pt. Can not pay for it. Pt. Stated that [facility] nurse from [facility] called stating she was going to call the nurse at [facility] so that the provider can fill out paperwork for the pt. To be able to get her wheelchair.  [place] [phone]  Please f/u

## 2015-04-05 ENCOUNTER — Ambulatory Visit: Payer: Medicaid Other | Attending: Internal Medicine | Admitting: Internal Medicine

## 2015-04-05 ENCOUNTER — Encounter: Payer: Self-pay | Admitting: Internal Medicine

## 2015-04-05 VITALS — BP 133/88 | HR 62 | Temp 98.8°F | Resp 17 | Ht 66.0 in | Wt 172.4 lb

## 2015-04-05 DIAGNOSIS — R2681 Unsteadiness on feet: Secondary | ICD-10-CM | POA: Diagnosis not present

## 2015-04-05 DIAGNOSIS — B1920 Unspecified viral hepatitis C without hepatic coma: Secondary | ICD-10-CM | POA: Insufficient documentation

## 2015-04-05 DIAGNOSIS — Z8679 Personal history of other diseases of the circulatory system: Secondary | ICD-10-CM

## 2015-04-05 DIAGNOSIS — M25522 Pain in left elbow: Secondary | ICD-10-CM

## 2015-04-05 DIAGNOSIS — F17210 Nicotine dependence, cigarettes, uncomplicated: Secondary | ICD-10-CM | POA: Diagnosis not present

## 2015-04-05 DIAGNOSIS — I614 Nontraumatic intracerebral hemorrhage in cerebellum: Secondary | ICD-10-CM | POA: Insufficient documentation

## 2015-04-05 DIAGNOSIS — E119 Type 2 diabetes mellitus without complications: Secondary | ICD-10-CM | POA: Diagnosis not present

## 2015-04-05 DIAGNOSIS — Z8673 Personal history of transient ischemic attack (TIA), and cerebral infarction without residual deficits: Secondary | ICD-10-CM | POA: Diagnosis not present

## 2015-04-05 DIAGNOSIS — Z888 Allergy status to other drugs, medicaments and biological substances status: Secondary | ICD-10-CM | POA: Diagnosis not present

## 2015-04-05 DIAGNOSIS — R296 Repeated falls: Secondary | ICD-10-CM

## 2015-04-05 MED ORDER — ACETAMINOPHEN-CODEINE #3 300-30 MG PO TABS: 1.0000 | ORAL_TABLET | Freq: Two times a day (BID) | ORAL | Status: DC | PRN

## 2015-04-05 NOTE — Progress Notes (Signed)
Patient ID: [name], female   DOB: [birth year], 60 y.o.   MRN: [id number] CC: wheelchair forms and left elbow pain  HPI: [name] is a 60 y.o. female here today for a follow up visit.  Patient has past medical history of cerebellar hemorrhage (07/2013), Hep C, HTN. Patient reports that she has applied for a wheelchair and was told she needed proper assessment. Patient currently has a walker with her but states that she has been having increasing difficulty ambulating. She notes that she has fallen several times because she loses her balance. As a result of falls she fell and broke her wrist 5 months ago. She has difficulty getting into tub, sitting low to reach the toilet and couch. She places pillows on the floor to break her fall when she falls down to sit on her couch. She usually has someone at home to help stabilize her when using the commode. During office visit she was only able to ambulate 25 feet before becoming unsteady.   Patient also complains of left elbow pain for the last 1.5 weeks. States that she feels like a bone is sticking out. Pain is so severe it is affecting her sleep. She does not remember if she fell on her elbow. Patient does endorse drinking one 24 oz can of beer per day.  Allergies  Allergen Reactions  . Ibuprofen Hives and Other (See Comments)    Burning of the skin.   Past Medical History  Diagnosis Date  . Hepatitis C   . Pneumonia   . Hypertension   . Diabetes mellitus without complication ([place])   . Stroke [facility])    Current Outpatient Prescriptions on File Prior to Visit  Medication Sig Dispense Refill  . ENSURE (ENSURE) Take 237 mLs by mouth 2 (two) times daily. 237 mL 12  . famotidine (PEPCID) 10 MG tablet Take 1 tablet (10 mg total) by mouth 2 (two) times daily. 60 tablet 4  . folic acid (FOLVITE) 1 MG tablet Take 1 tablet (1 mg total) by mouth daily. 30 tablet 3  . gabapentin (NEURONTIN) 100 MG capsule TAKE ONE CAPSULE BY MOUTH TWICE DAILY 60  capsule 2  . HYDROcodone-acetaminophen (NORCO/VICODIN) 5-325 MG per tablet Take 1 tablet by mouth every 4 (four) hours as needed for moderate pain. 15 tablet 0  . hydrOXYzine (ATARAX/VISTARIL) 25 MG tablet Take 25 mg by mouth at bedtime as needed.    . metoprolol succinate (TOPROL-XL) 25 MG 24 hr tablet TAKE ONE TABLET BY MOUTH TWICE DAILY 60 tablet 2  . thiamine (VITAMIN B-1) 100 MG tablet Take 1 tablet (100 mg total) by mouth daily. 30 tablet 3  . FLUoxetine (PROZAC) 20 MG capsule Take 1 capsule (20 mg total) by mouth daily. (Patient not taking: Reported on 01/19/2015) 30 capsule 3  . Ledipasvir-Sofosbuvir (HARVONI) 90-400 MG TABS Take 1 tablet by mouth daily. (Patient not taking: Reported on 10/18/2014) 28 tablet 2  . metoprolol tartrate (LOPRESSOR) 25 MG tablet Take 1 tablet (25 mg total) by mouth 2 (two) times daily. 60 tablet 5   No current facility-administered medications on file prior to visit.   Family History  Problem Relation Age of Onset  . Diabetes Father   . Diabetes Sister    Social History   Social History  . Marital Status: Single    Spouse Name: N/A  . Number of Children: N/A  . Years of Education: N/A   Occupational History  . Not on file.   Social  History Main Topics  . Smoking status: Current Every Day Smoker -- 1.00 packs/day for 40 years    Types: Cigarettes  . Smokeless tobacco: Never Used     Comment: cutting back  . Alcohol Use: 4.2 oz/week    7 Cans of beer per week     Comment: 1 beer/day  . Drug Use: No     Comment: last used 11/29/13  . Sexual Activity: Not Currently   Other Topics Concern  . Not on file   Social History Narrative    Review of Systems: Other than what is stated in HPI, all other systems are negative.   Objective:   Filed Vitals:   04/05/15 1051  BP: 133/88  Pulse: 62  Temp: 98.8 F (37.1 C)  Resp: 17    Physical Exam  Constitutional: She is oriented to person, place, and time.  Cardiovascular: Normal rate,  regular rhythm and normal heart sounds.   Pulmonary/Chest: Effort normal and breath sounds normal.  Musculoskeletal:  Left sided weakness noted when walking Walker with ambulation   Neurological: She is alert and oriented to person, place, and time.  Psychiatric: She has a normal mood and affect.     Lab Results  Component Value Date   WBC 3.9* 12/02/2013   HGB 14.3 12/02/2013   HCT 40.1 12/02/2013   MCV 98.0 12/02/2013   PLT 128* 12/02/2013   Lab Results  Component Value Date   CREATININE 0.66 10/18/2014   BUN 7 10/18/2014   NA 142 10/18/2014   K 3.9 10/18/2014   CL 106 10/18/2014   CO2 23 10/18/2014    Lab Results  Component Value Date   HGBA1C 5.3 10/18/2014   Lipid Panel     Component Value Date/Time   CHOL 127 05/25/2014 1045   TRIG 85 05/25/2014 1045   HDL 33* 05/25/2014 1045   CHOLHDL 3.8 05/25/2014 1045   VLDL 17 05/25/2014 1045   LDLCALC 77 05/25/2014 1045       Assessment and plan:   [name] was seen today for arm pain.  Diagnoses and all orders for this visit:  Unsteady gait Wheelchair forms completed in office  Falls frequently -     Ambulatory referral to Physical Therapy  History of cerebellar hemorrhage -     Ambulatory referral to Physical Therapy -     Lipid panel -     Basic Metabolic Panel Unsure why patient is not on statin therapy. I will check labs today and initiate as appropriate.  Left elbow pain -     Ambulatory referral to Orthopedic Surgery -     DG Elbow Complete Left; Future -     acetaminophen-codeine (TYLENOL #3) 300-30 MG tablet; Take 1 tablet by mouth every 12 (twelve) hours as needed for severe pain.   Return in about 3 months (around 07/04/2015) for Hypertension.       Lance Bosch, [facility] [phone] 04/05/2015, 1:48 PM

## 2015-04-05 NOTE — Patient Instructions (Addendum)
I have placed referral to physical therapy to help you gain strength to hopefully prevent frequent falls I will send wheelchair forms when I complete  Please go get xray on elbow I have also sent a referral to Orthopedics for your elbow

## 2015-04-05 NOTE — Progress Notes (Signed)
Patient here to be evaluated for a wheel chair Patient has unsteady gait  Can only walk a short distance with out getting tired and  Out of breath Patient also complains of pain to her left elbow

## 2015-04-06 LAB — BASIC METABOLIC PANEL
BUN: 7 mg/dL (ref 7–25)
CO2: 29 mmol/L (ref 20–31)
CREATININE: 0.7 mg/dL (ref 0.50–1.05)
Calcium: 9.1 mg/dL (ref 8.6–10.4)
Chloride: 106 mmol/L (ref 98–110)
GLUCOSE: 81 mg/dL (ref 65–99)
POTASSIUM: 4.4 mmol/L (ref 3.5–5.3)
Sodium: 141 mmol/L (ref 135–146)

## 2015-04-06 LAB — LIPID PANEL
CHOL/HDL RATIO: 3.2 ratio (ref <=5.0)
CHOLESTEROL: 112 mg/dL — AB (ref 125–200)
HDL: 35 mg/dL — ABNORMAL LOW (ref >=46)
LDL Cholesterol: 65 mg/dL (ref <130)
TRIGLYCERIDES: 62 mg/dL (ref <150)
VLDL: 12 mg/dL (ref <30)

## 2015-04-11 ENCOUNTER — Telehealth: Payer: Self-pay

## 2015-04-11 NOTE — Telephone Encounter (Signed)
-----   Message from Lance Bosch, NP sent at 04/09/2015  5:19 PM EST ----- Labs look great

## 2015-04-11 NOTE — Telephone Encounter (Signed)
Spoke with patient and she is aware her labs came back good

## 2015-04-12 IMAGING — CT CT HEAD W/O CM
2 series · 15 of 30 positions shown, 19 images · non-contrast
Comparison: None.

CLINICAL DATA: Code stroke.

EXAM:
CT HEAD WITHOUT CONTRAST
TECHNIQUE: Contiguous axial images were obtained from the base of the skull
through the vertex without intravenous contrast.

[Series 201: head w/o, idose (1) · axial · non-contrast · 0.49mm/px · z∈[+99,+224]mm · 13 of 31 slices shown, 17 images]
[im 3/31  brain]
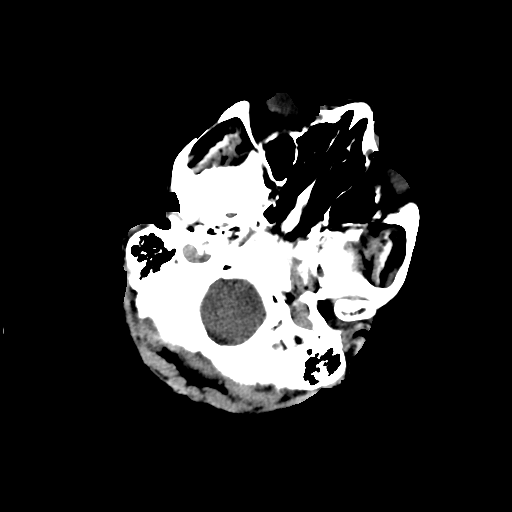
[im 3/31  bone]
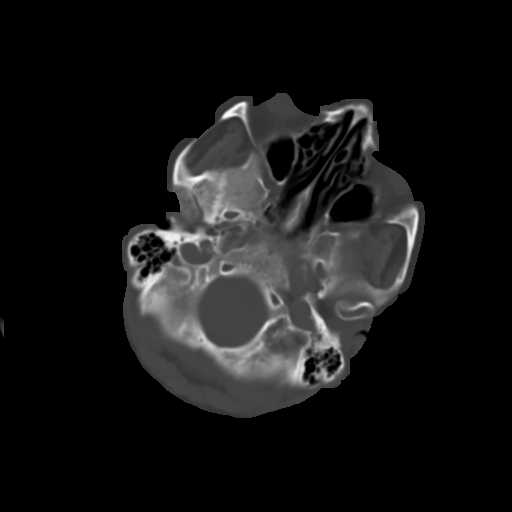
[im 5/31  brain]
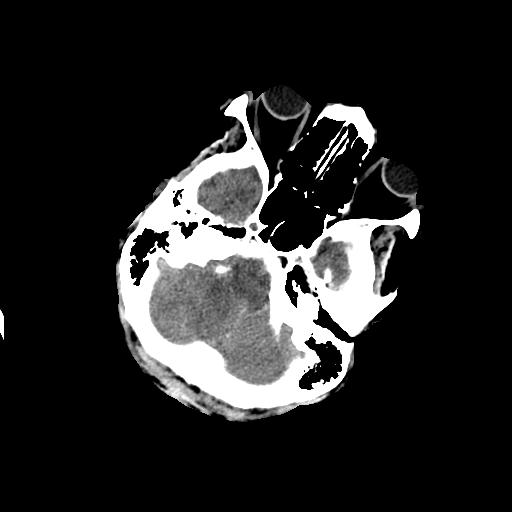
[im 7/31  brain]
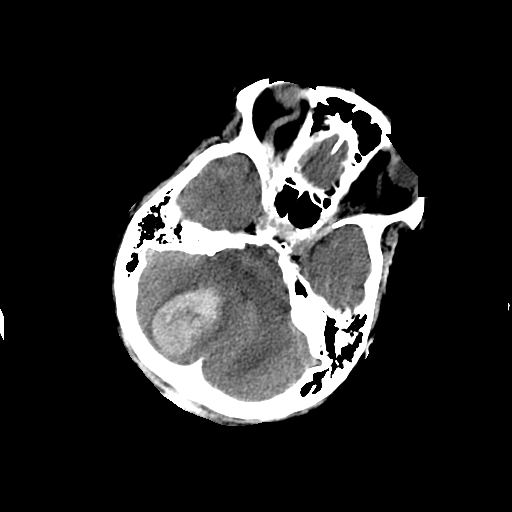
[im 9/31  brain]
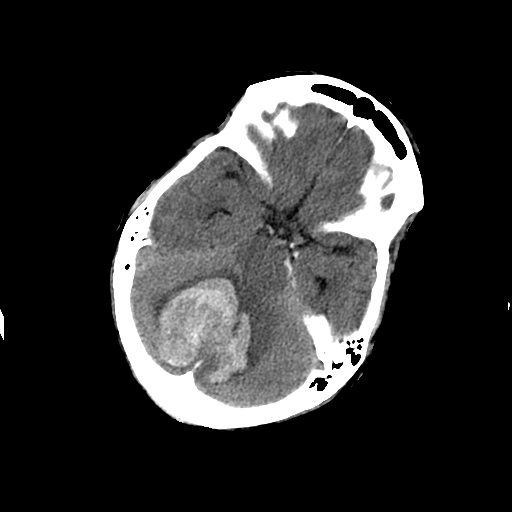
[im 11/31  brain]
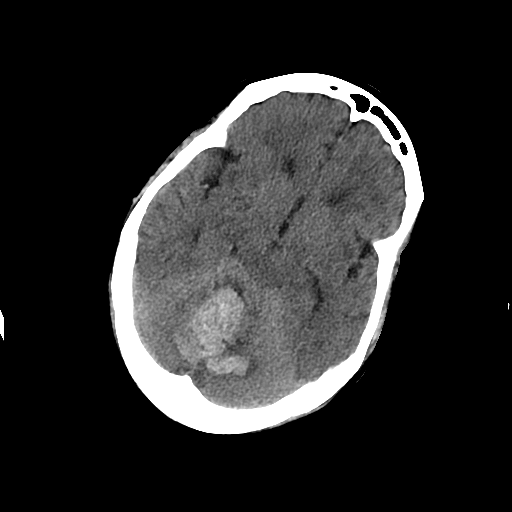
[im 11/31  bone]
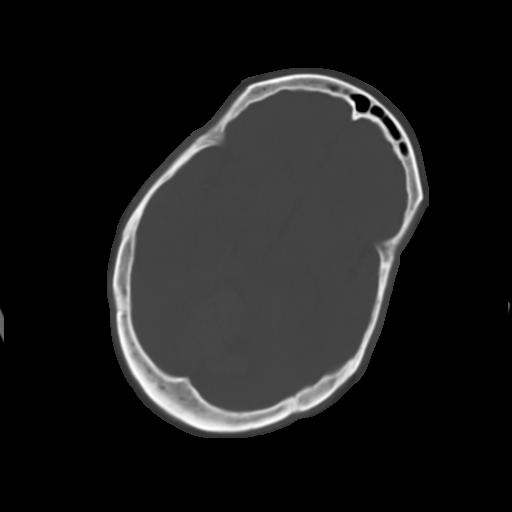
[im 13/31  brain]
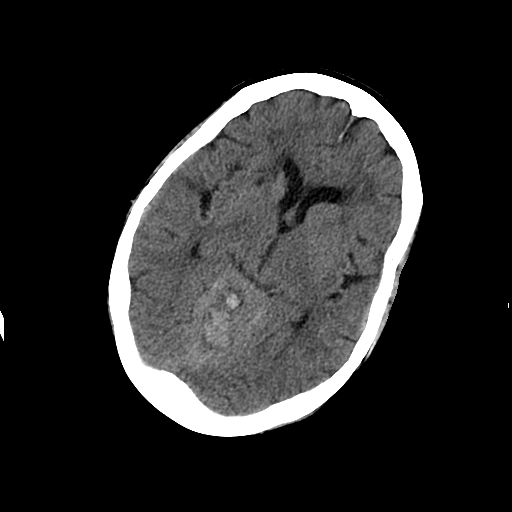
[im 16/31  brain]
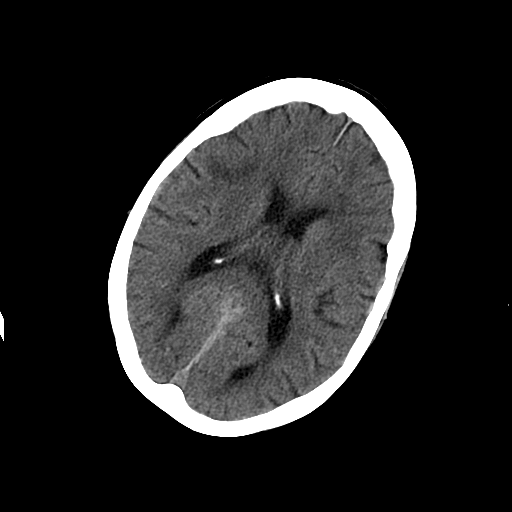
[im 18/31  brain]
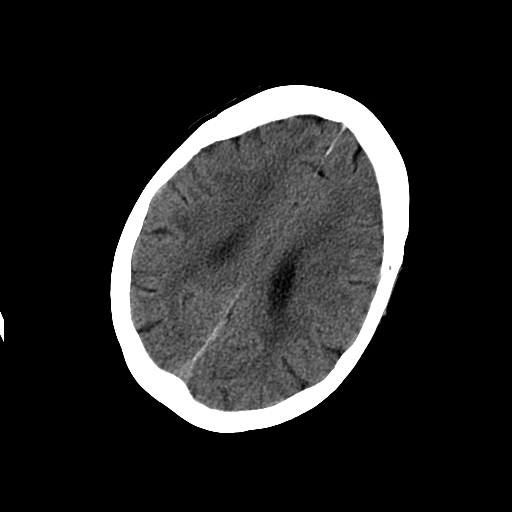
[im 20/31  brain]
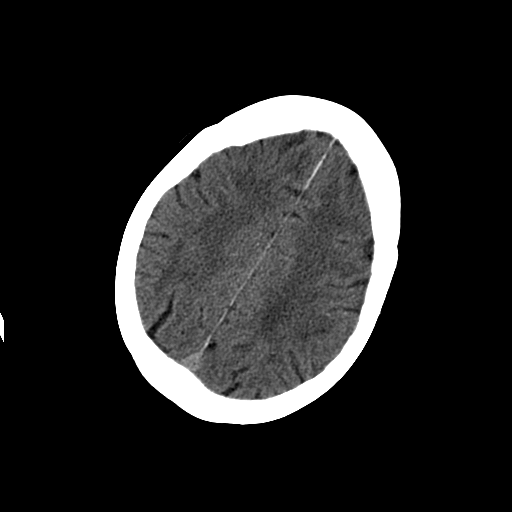
[im 20/31  bone]
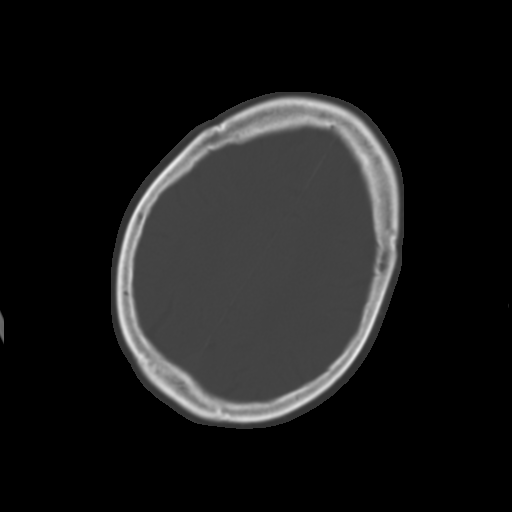
[im 22/31  brain]
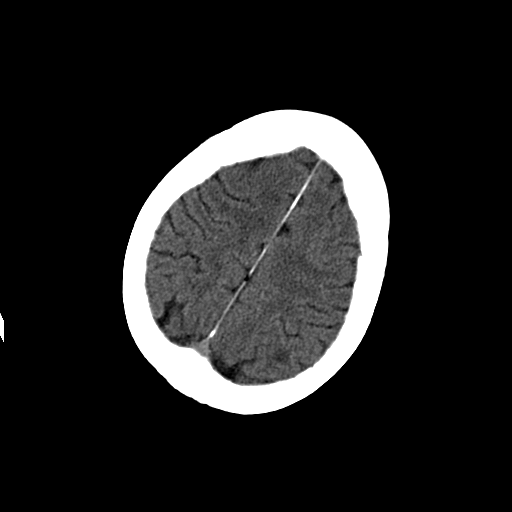
[im 24/31  brain]
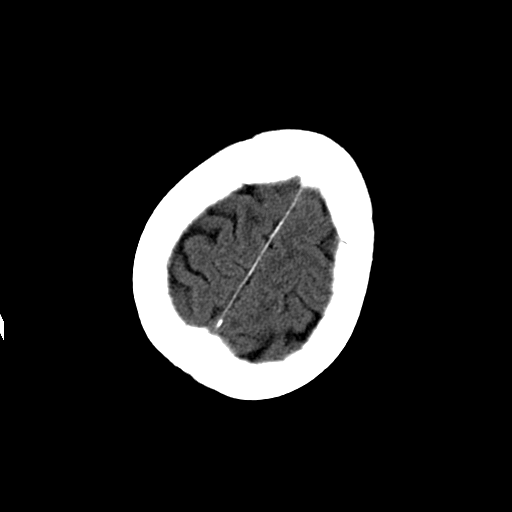
[im 26/31  brain]
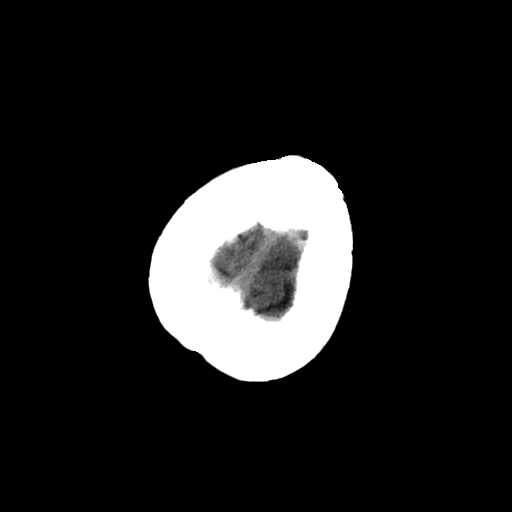
[im 28/31  brain]
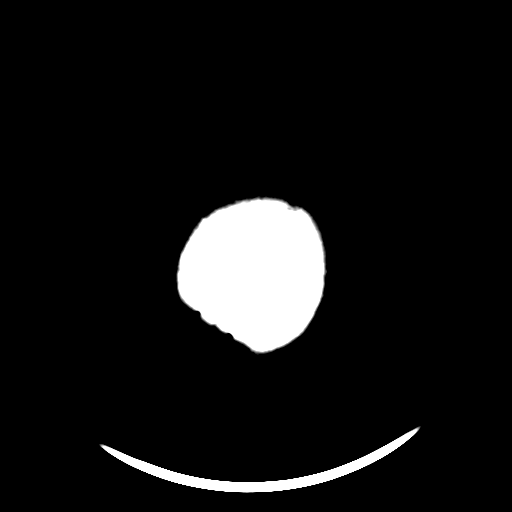
[im 28/31  bone]
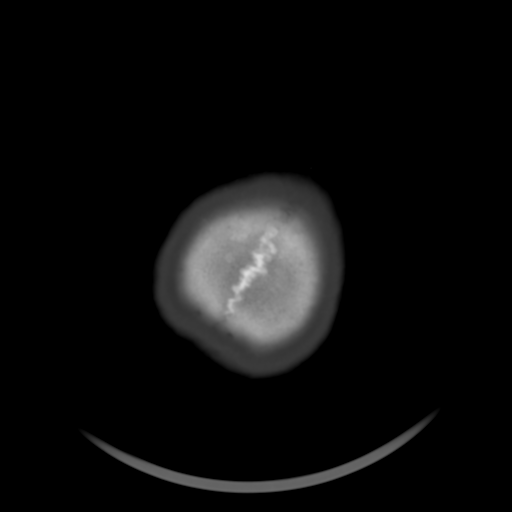

[Series 202: head w/o bone, idose (1) · axial · non-contrast · 0.49mm/px · z∈[+99,+119]mm · 2 of 31 slices shown]
[im 3/31  bone]
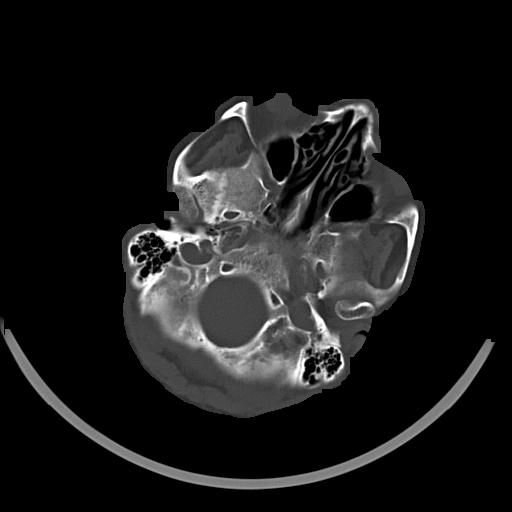
[im 7/31  bone]
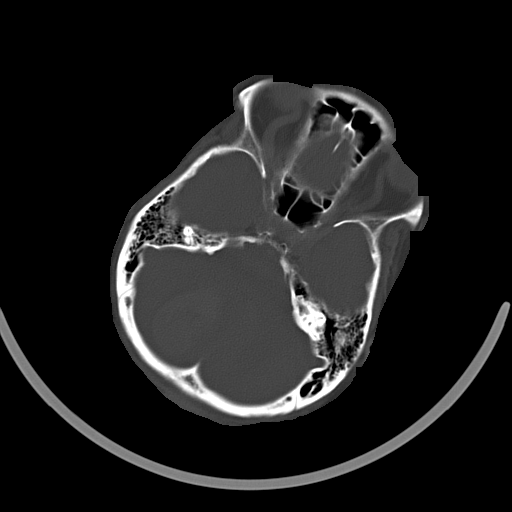

[15 of 30 positions shown; findings below may reference images not displayed]

FINDINGS: There is a large acute intraparenchymal hemorrhage in the
cerebellum, centered to the right of midline, but also crosses the
midline. The hemorrhage measures approximately 4.8 x 4.7 cm axial
dimension and causes significant mass effect in the posterior fossa.
The fourth ventricle appears to be nearly completely effaced, and
there is loss of the normal basilar cisterns. On image number 2,
there suggestion of tonsillar herniation. There is a rim of edema
surrounding the hemorrhage. Early hydrocephalus involving the
temporal horns of the lateral ventricles is suggested, but no
gross/significant hydrocephalus is present at this time. Moderate
degree of chronic microvascular ischemic changes are noted in the
periventricular and subcortical white matter bilaterally. No
supratentorial evidence of acute infarction.

The skull is intact. The visualized paranasal sinuses and mastoid
air cells are clear.
IMPRESSION: Large acute intraparenchymal cerebellar hemorrhage with significant
mass effect in the posterior fossa and probable tonsillar
herniation. Fourth ventricle appears nearly completely effaced.
Cannot exclude early hydrocephalus. Critical Value/emergent results
were called by telephone at the time of interpretation on 08/18/2013
at [DATE] to Dr. ANIKET SILVAS , who was already aware of
these results.

## 2015-04-13 IMAGING — CT CT HEAD W/O CM
1 series · 16 of 30 positions shown, 20 images · non-contrast
Comparison: 08/18/2013 head CT.

CLINICAL DATA: Followup intracranial hemorrhage.  Hypertension.

EXAM:
CT HEAD WITHOUT CONTRAST
TECHNIQUE: Contiguous axial images were obtained from the base of the skull
through the vertex without intravenous contrast.

[Series 2: head 5.0 h30s · axial · 0.43mm/px · z∈[-273,-128]mm · 16 of 33 slices shown, 20 images]
[im 2/33  brain]
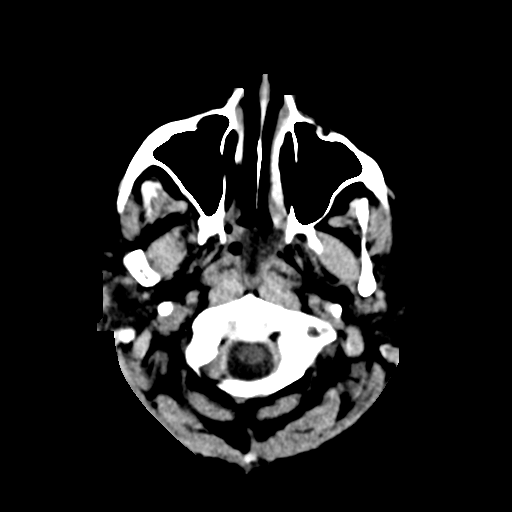
[im 2/33  bone]
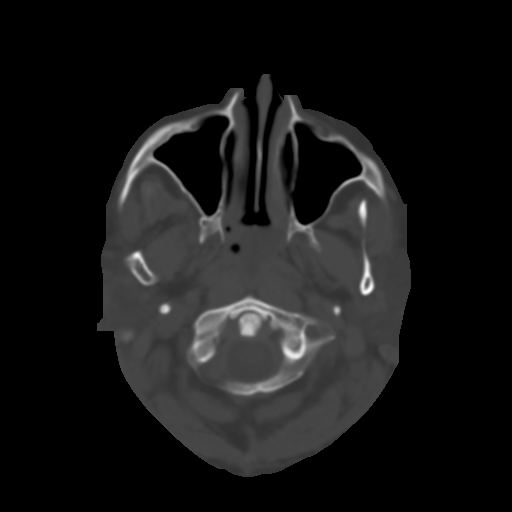
[im 4/33  brain]
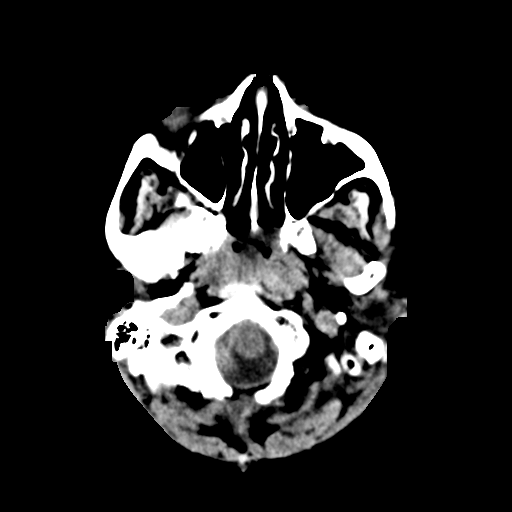
[im 6/33  brain]
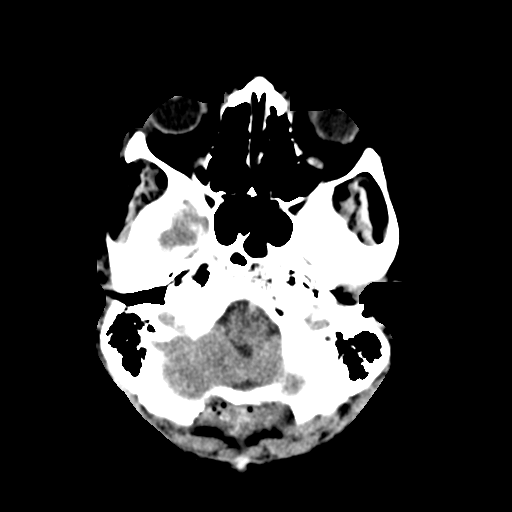
[im 8/33  brain]
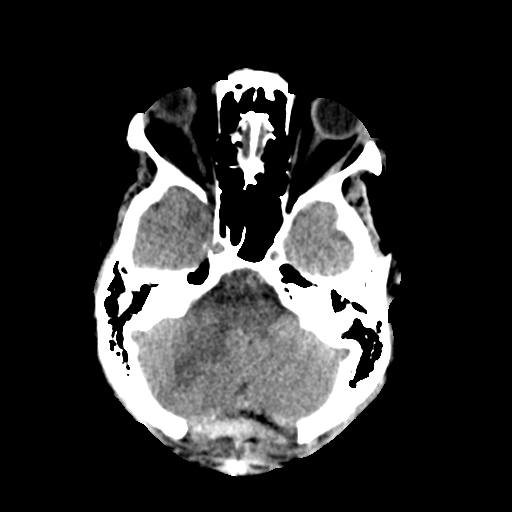
[im 9/33  brain]
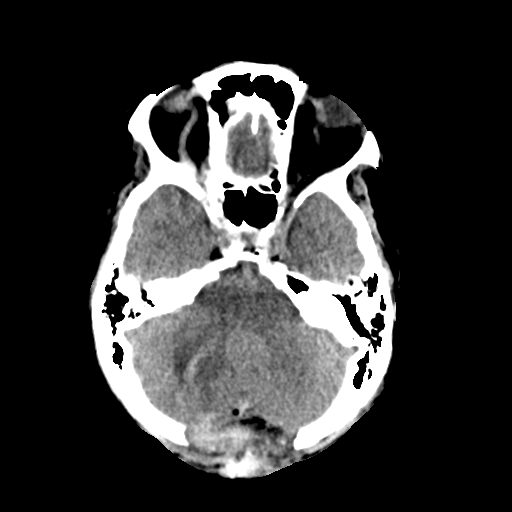
[im 9/33  bone]
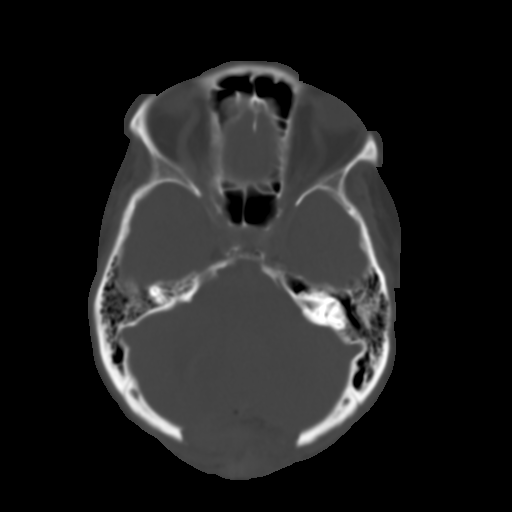
[im 12/33  brain]
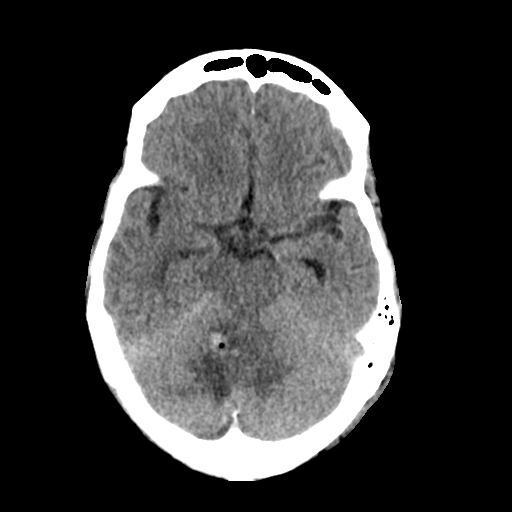
[im 14/33  brain]
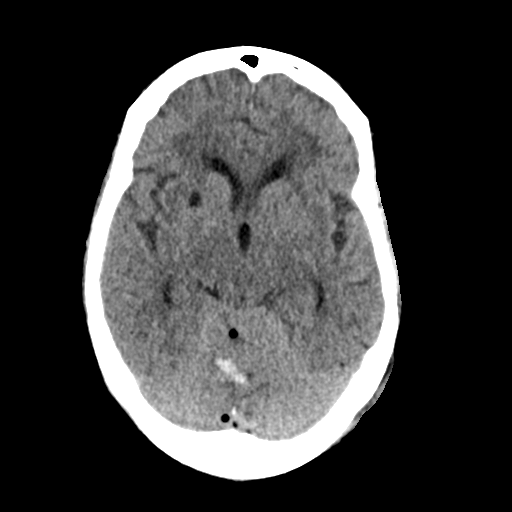
[im 16/33  brain]
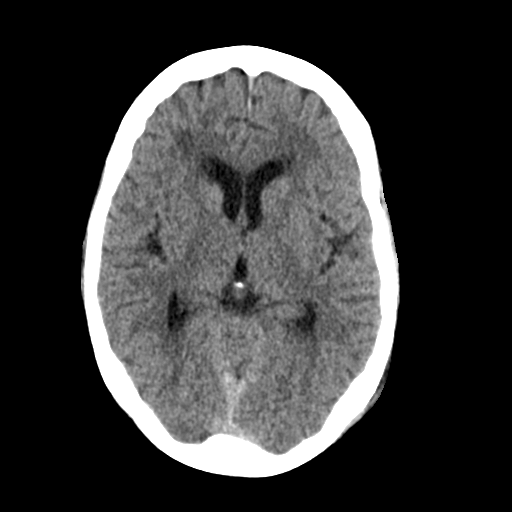
[im 17/33  brain]
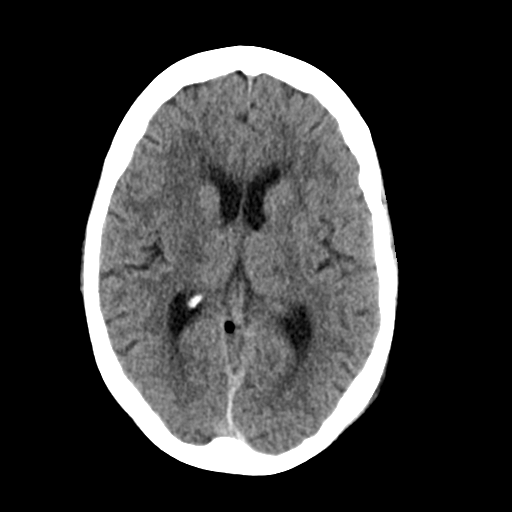
[im 17/33  bone]
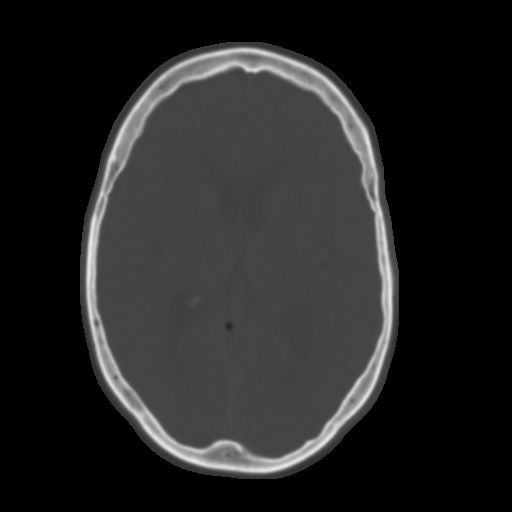
[im 19/33  brain]
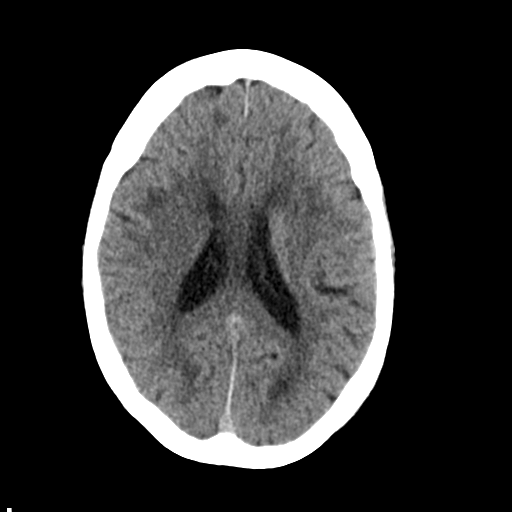
[im 21/33  brain]
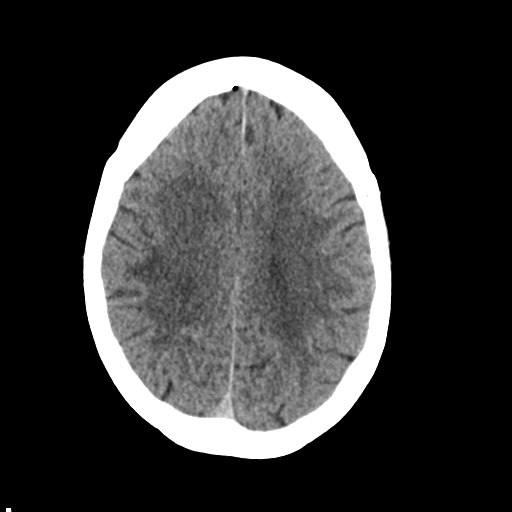
[im 24/33  brain]
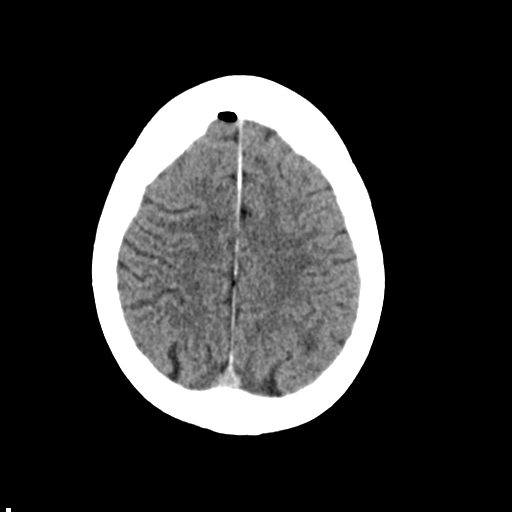
[im 25/33  brain]
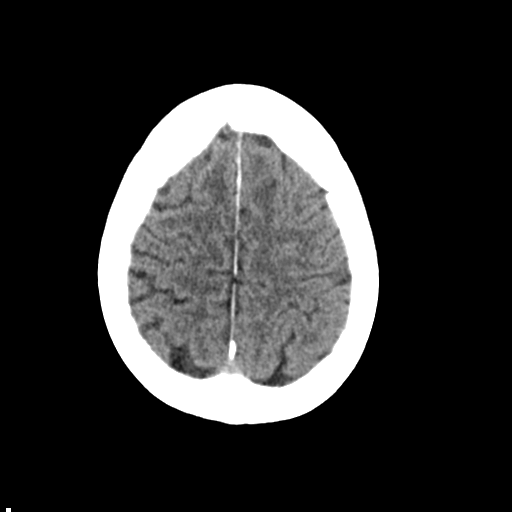
[im 25/33  bone]
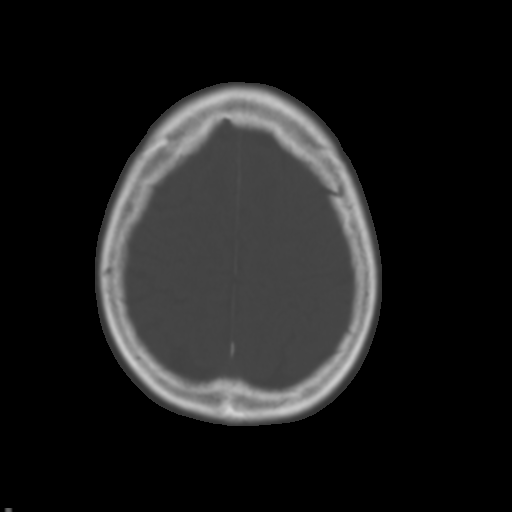
[im 27/33  brain]
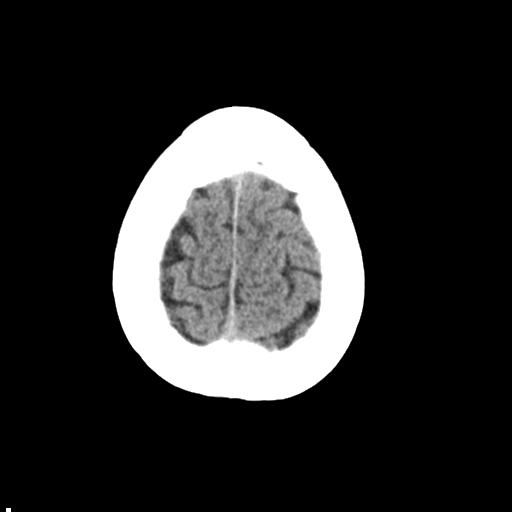
[im 29/33  brain]
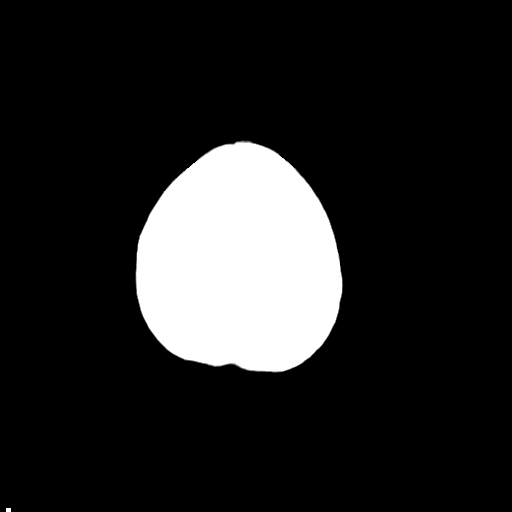
[im 31/33  brain]
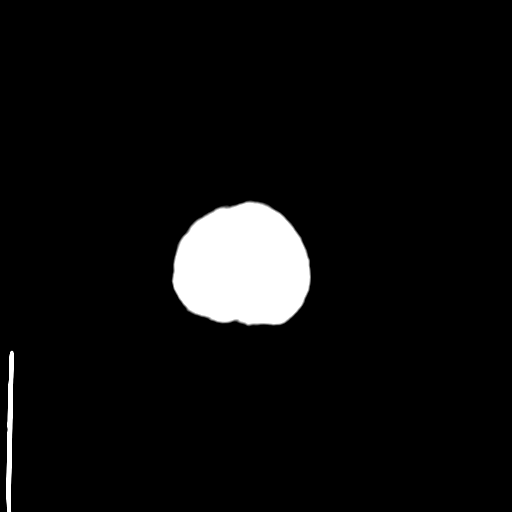

[16 of 30 positions shown; findings below may reference images not displayed]

FINDINGS: Post suboccipital craniectomy with surgical evacuation of posterior
fossa hematoma. Small amount of blood right lateral aspect and
posterior aspect of the surgical site. Small amount of
pneumocephalus. Decreased associated mass effect with less
distortion of the fourth ventricle. Right cerebellar tonsil remains
slightly low lying. No hydrocephalus.

Cause of the intracranial hemorrhage is indeterminate. After
postoperative changes have cleared, followup contrast-enhanced MR
Ornello be considered.

Remote right lenticular nucleus infarct. Small vessel disease type
changes. No CT evidence of large acute thrombotic infarct.
IMPRESSION: Interval surgical decompression of posterior fossa hematoma as
detailed above.

## 2015-04-19 ENCOUNTER — Other Ambulatory Visit: Payer: Self-pay | Admitting: Internal Medicine

## 2015-04-19 ENCOUNTER — Telehealth: Payer: Self-pay | Admitting: Internal Medicine

## 2015-04-19 DIAGNOSIS — G588 Other specified mononeuropathies: Secondary | ICD-10-CM

## 2015-04-19 DIAGNOSIS — F10129 Alcohol abuse with intoxication, unspecified: Secondary | ICD-10-CM

## 2015-04-19 MED ORDER — FOLIC ACID 1 MG PO TABS: 1.0000 mg | ORAL_TABLET | Freq: Every day | ORAL | Status: DC

## 2015-04-19 NOTE — Telephone Encounter (Signed)
Spoke with patient and confirmed that she has been taking the metoprolol tartrate twice daily and has NOT been taking the metoprolol succinate. Will remove metoprolol succinate from her medication list. Patient also verbally requested a refill of her folic acid so I will refill that as well.

## 2015-04-19 NOTE — Telephone Encounter (Signed)
Pt. Called requesting a med refill for Tylenol # 3. Pt. Stated she is out of the medicaition. Please f/u with pt.

## 2015-04-20 ENCOUNTER — Ambulatory Visit (HOSPITAL_COMMUNITY)
Admission: RE | Admit: 2015-04-20 | Discharge: 2015-04-20 | Disposition: A | Discharge status: Home / Self Care | Payer: Medicaid Other | Source: Ambulatory Visit | Attending: Internal Medicine | Admitting: Internal Medicine

## 2015-04-20 ENCOUNTER — Telehealth: Payer: Self-pay

## 2015-04-20 DIAGNOSIS — M25522 Pain in left elbow: Secondary | ICD-10-CM | POA: Diagnosis not present

## 2015-04-20 NOTE — Telephone Encounter (Signed)
Returned call to patient  Patient is aware that it is too early to refill her tylenol #3 Instructed patient that she can take OTC tylenol until this can be  refilled

## 2015-04-21 ENCOUNTER — Telehealth: Payer: Self-pay

## 2015-04-21 ENCOUNTER — Ambulatory Visit: Payer: Medicaid Other | Attending: Internal Medicine

## 2015-04-21 DIAGNOSIS — R2681 Unsteadiness on feet: Secondary | ICD-10-CM | POA: Insufficient documentation

## 2015-04-21 DIAGNOSIS — R29898 Other symptoms and signs involving the musculoskeletal system: Secondary | ICD-10-CM | POA: Insufficient documentation

## 2015-04-21 DIAGNOSIS — R269 Unspecified abnormalities of gait and mobility: Secondary | ICD-10-CM | POA: Diagnosis present

## 2015-04-21 DIAGNOSIS — R279 Unspecified lack of coordination: Secondary | ICD-10-CM | POA: Diagnosis present

## 2015-04-21 DIAGNOSIS — R278 Other lack of coordination: Secondary | ICD-10-CM

## 2015-04-21 NOTE — Telephone Encounter (Signed)
Spoke with patient this am and she is aware of her normal xray results

## 2015-04-21 NOTE — Telephone Encounter (Signed)
-----   Message from Lance Bosch, NP sent at 04/21/2015 10:29 AM EST ----- Normal elbow xray

## 2015-04-21 NOTE — Therapy (Signed)
[facility] [address] [place], [place], [address] Phone: [phone]   Fax:  [phone]  Physical Therapy Evaluation  Patient Details  Name: [name] MRN: [id number] Date of Birth: [birth year] Referring Provider: Chari Manning, NP  Encounter Date: 04/21/2015      PT End of Session - 04/21/15 1550    Visit Number 1   Number of Visits 4   Date for PT Re-Evaluation 06/20/15   Authorization Type Medicaid-auth pending    PT Start Time 1315   PT Stop Time 1357   PT Time Calculation (min) 42 min   Equipment Utilized During Treatment Gait belt   Activity Tolerance Patient tolerated treatment well   Behavior During Therapy [facility] for tasks assessed/performed      Past Medical History  Diagnosis Date  . Hepatitis C   . Pneumonia   . Hypertension   . Diabetes mellitus without complication ([place])   . Stroke [facility])     Past Surgical History  Procedure Laterality Date  . Neck surgery    . Shoulder surgery Bilateral   . Craniectomy N/A 08/18/2013    Procedure: CRANIECTOMY POSTERIOR FOSSA DECOMPRESSION, EVACUATION OF HEMORRHAGE;  Surgeon: Consuella Lose, MD;  Location: [facility];  Service: Neurosurgery;  Laterality: N/A;    There were no vitals filed for this visit.  Visit Diagnosis:  Abnormality of gait - Plan: PT plan of care cert/re-cert  Unsteadiness - Plan: PT plan of care cert/re-cert  Weakness of both lower extremities - Plan: PT plan of care cert/re-cert  Decreased coordination - Plan: PT plan of care cert/re-cert      Subjective Assessment - 04/21/15 1324    Subjective Pt had a CVA in 07/2013 and has experienced R-sided weakness since that time. Pt reports that lately she has been experiencing progressively worsening balance over the last few months. Pt reports she has fallen twice in the last 6 months, one fall in 10/2014 results in R wrist fracture. Pt reports she is not currently taking pain  medications, as she ran out of medication.    Pertinent History HTN, DM, hx of CVA (cerebellar), Hep C   Patient Stated Goals Walk up/down steps safely, walk better   Currently in Pain? Yes   Pain Score 7    Pain Location Elbow   Pain Orientation Left   Pain Descriptors / Indicators Aching   Pain Type Chronic pain   Pain Onset More than a month ago   Aggravating Factors  night time makes it worse, lying on L side   Pain Relieving Factors rest            [facility] PT Assessment - 04/21/15 1330    Assessment   Medical Diagnosis Falls frequently, history of cerebellar hemorrhage   Referring Provider Chari Manning, NP   Onset Date/Surgical Date 10/31/14   Hand Dominance Left   Prior Therapy Inpatient rehab and SNF s/p CVA   Precautions   Precautions Fall   Precaution Comments based on hx of falls, BERG score, TUG time and gait speed.   Restrictions   Weight Bearing Restrictions No   Balance Screen   Has the patient fallen in the past 6 months Yes   How many times? 2   Has the patient had a decrease in activity level because of a fear of falling?  No   Is the patient reluctant to leave their home because of a fear of falling?  Yes   Home  [place] residence   Living Arrangements Alone   Available Help at Discharge Other (Comment)  pt has an aide (9-11:30am) [place] Level entry   [place] - 2 wheels  with R platform    Prior Function   Level of Independence Needs assistance with ADLs;Requires assistive device for independence   Vocation On disability   Cognition   Overall Cognitive Status Impaired/Different from baseline   Area of Impairment Attention   Current Attention Level Alternating   Behaviors Restless;Perseveration  perseveratrion on L elbow pain,   Sensation   Light Touch Appears Intact   Additional Comments Pt reported B feet N/T.   Coordination   Gross  Motor Movements are Fluid and Coordinated Yes   Fine Motor Movements are Fluid and Coordinated No  Decr. R hand coordination   Posture/Postural Control   Posture/Postural Control Postural limitations   Postural Limitations Forward head   Tone   Assessment Location Right Lower Extremity;Left Lower Extremity   ROM / Strength   AROM / PROM / Strength AROM;Strength   AROM   Overall AROM  Within functional limits for tasks performed   Overall AROM Comments B UE/LE AROM WFL   Strength   Overall Strength Deficits   Overall Strength Comments B UE WFL, and B LE strength WFL except for decr. B hip abd strength: grossly 3+/5 seated.   Transfers   Transfers Sit to Stand;Stand to Sit   Sit to Stand 4: Min guard;Without upper extremity assist;From chair/3-in-1   Stand to Sit 4: Min guard;Without upper extremity assist;To chair/3-in-1   Ambulation/Gait   Ambulation/Gait Yes   Ambulation/Gait Assistance 5: Supervision;4: Min guard   Ambulation/Gait Assistance Details Pt amb. with flexed trunk. Min guard while performing turns, otherwise, S.   Ambulation Distance (Feet) 75 Feet   Assistive device Rolling walker   Gait Pattern Step-through pattern;Decreased stride length;Trunk flexed;Decreased step length - left  pt reported R knee after amb. (5/10)   Ambulation Surface Level;Indoor   Gait velocity 1.62ft/sec.  no AD   Standardized Balance Assessment   Standardized Balance Assessment Berg Balance Test;Timed Up and Go Test   Berg Balance Test   Sit to Stand Able to stand  independently using hands   Standing Unsupported Able to stand 2 minutes with supervision   Sitting with Back Unsupported but Feet Supported on Floor or Stool Able to sit safely and securely 2 minutes   Stand to Sit Controls descent by using hands   Transfers Able to transfer with verbal cueing and /or supervision   Standing Unsupported with Eyes Closed Able to stand 10 seconds with supervision   Standing Ubsupported with Feet  Together Able to place feet together independently and stand for 1 minute with supervision   From Standing, Reach Forward with Outstretched Arm Can reach confidently >25 cm (10")   From Standing Position, Pick up Object from Floor Able to pick up shoe, needs supervision   From Standing Position, Turn to Look Behind Over each Shoulder Needs supervision when turning   Turn 360 Degrees Needs close supervision or verbal cueing   Standing Unsupported, Alternately Place Feet on Step/Stool Able to complete >2 steps/needs minimal assist   Standing Unsupported, One Foot in Front Able to take small step independently and hold 30 seconds   Standing on One Leg Tries to lift leg/unable to hold 3 seconds but  remains standing independently   Total Score 34   Timed Up and Go Test   TUG Normal TUG   Normal TUG (seconds) 30.3  with RW   RLE Tone   RLE Tone Within Functional Limits   LLE Tone   LLE Tone Within Functional Limits                           PT Education - 04/21/15 1549    Education provided Yes   Education Details PT discussed frequency/duration and outcome measure results.   Person(s) Educated Patient   Methods Explanation   Comprehension Verbalized understanding          PT Short Term Goals - 04/21/15 1556    PT SHORT TERM GOAL #1   Title same as LTGs           PT Long Term Goals - 04/21/15 1557    PT LONG TERM GOAL #1   Title Pt will be IND in HEP to decrease falls risk, improve strength, and balance. Target date: 06/16/15   Baseline No HEP.   Status New   PT LONG TERM GOAL #2   Title Pt will improve gait speed with LRAD to >/=1.95ft/sec. to decrease falls risk. Target date: 06/16/15.   Baseline 1.58ft/sec. with RW   Status New   PT LONG TERM GOAL #3   Title Pt will improve BERG score to >/=39/56 to decr. falls risk. Target date: 06/16/15   Baseline 34/56   Status New   PT LONG TERM GOAL #4   Title Pt will improve TUG time with LRAD to </= 27.0 sec.  to deccrease falls risk. Target date: 06/16/15   Baseline 30.3sec. with RW.   Status New   PT LONG TERM GOAL #5   Title Pt will verbalize understanding of fall prevention strategies to reduce falls risk. Target date: 06/16/15   Baseline Pt unable to verbalize any strategies for preventing falls.   Status New   Additional Long Term Goals   Additional Long Term Goals Yes   PT LONG TERM GOAL #6   Title Pt will amb. 500' over even and paved surfaces with RW at MOD I level to improve functional mobility. Target date: 06/16/15   Baseline 75' with RW over even terrain, min guard to supervision   Status New               Plan - 04/21/15 1551    Clinical Impression Statement Pt is a 60y/o female presenting to OPPT neuro with history of cerebellar hemorrhage (Z86.79) and falls frequently (R29.6). Pt fell and fractured R wrist in 10/2014, and presented to ED but was not hospitalized. Pt presented with gait deviations, impaired strength, impaired balance, impaired coordination, decreased safety awareness, and N/T in B feet. Pt's gait speed, TUG time, and BERG score all indicate pt is at risk for falls.    Pt will benefit from skilled therapeutic intervention in order to improve on the following deficits Abnormal gait;Decreased endurance;Decreased knowledge of use of DME;Decreased balance;Decreased mobility;Decreased cognition;Decreased coordination;Decreased safety awareness;Postural dysfunction;Decreased strength;Other (comment)  N/T in feet   Rehab Potential Good   Clinical Impairments Affecting Rehab Potential Pt reports she drinks 24 oz. of beer/day and is already at risk for falls, HTN, DM, Hep C   PT Frequency Other (comment)  3 visits over 8 weeks period to allow for Medicaid auth and scheduling.   PT Duration Other (comment)  3 visits  over 8 weeks to allow for Medicaid auth and scheduling.   PT Treatment/Interventions ADLs/Self Care Home Management;Biofeedback;Balance training;Therapeutic  exercise;Manual techniques;Therapeutic activities;Functional mobility training;Stair training;Gait training;Orthotic Fit/Training;Patient/family education;DME Instruction;Neuromuscular re-education   PT Next Visit Plan Initiate balance and functional strengthening (STS transfers).   Consulted and Agree with Plan of Care Patient         Problem List Patient Active Problem List   Diagnosis Date Noted  . 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 Spondylosis of lumbar region without myelopathy or radiculopathy 02/18/2014  . Adjustment disorder with mixed anxiety and depressed mood 09/09/2013  . Thrombocytopenia, unspecified ([place]) 09/09/2013  . ICH (intracerebral hemorrhage) ([place]) 08/26/2013  . Cytotoxic cerebral edema ([place]) 08/20/2013  . Brain herniation ([place]) 08/20/2013  . Cerebellar hemorrhage, nontraumatic ([facility]) 08/18/2013  . Chronic hepatitis C without hepatic coma ([place]) 08/03/2013  . Loss of weight 08/03/2013  . Arthritis 05/03/2013  . Influenza with respiratory manifestations 05/03/2013    Nicolis Boody L 04/21/2015, 4:02 PM  [facility] [address] [place], [place], [address] Phone: [phone]   Fax:  [phone]  Name: [name] MRN: [id number] Date of Birth: Feb 03, 1956   Geoffry Paradise, PT,DPT 04/21/2015 4:02 PM Phone: [phone] Fax: [phone]

## 2015-05-03 ENCOUNTER — Telehealth: Payer: Self-pay

## 2015-05-03 NOTE — Telephone Encounter (Signed)
Called patient this am  Patient is aware we are not refilling her tylenol #3 Patient was referred to ortho and if they feel she needs Pain medications they will be the office to prescribe it

## 2015-05-03 NOTE — Telephone Encounter (Signed)
Pt. Called requesting a med refill on Tylenol # 3 and insure. Pt. Stated that she know that she can not get a refill on Tylenol # 3 until 05/05/14 and that is on a Saturday, pt would like to know if she can get a refill on it on Friday 05/05/15. Pt. Also would like for PCP to know that she has her wheelchair. Please f/u with pt.

## 2015-05-05 ENCOUNTER — Telehealth: Payer: Self-pay

## 2015-05-05 ENCOUNTER — Telehealth: Payer: Self-pay | Admitting: Internal Medicine

## 2015-05-05 DIAGNOSIS — R269 Unspecified abnormalities of gait and mobility: Secondary | ICD-10-CM

## 2015-05-05 NOTE — Telephone Encounter (Signed)
Patient called stating that the Physical therapy that she was referred to, is not covered by medicaid. Patient would like to know if there is an alternative for this.  Patient is also needing a medication refill for Tylenol 3 for arm pain. Please follow up with patient

## 2015-05-05 NOTE — Telephone Encounter (Signed)
Returned call to patient Patient is stating that medicaid will not pay for physical therapy After speaking with referral coordinator they are supposed to pay For the first visit Call transferred to referral specialist to clarify

## 2015-05-05 NOTE — Telephone Encounter (Signed)
Spoke with pharmacy in reference to this patient Ensure is not covered through her insurance Patient again made aware that her provider is not going to refill Her tylenol #3

## 2015-05-05 NOTE — Telephone Encounter (Signed)
Pt. Needs refill for tylenol #3.Marland KitchenMarland KitchenMarland KitchenMarland Kitchenplease contact pharmacy so patient can get Ensure deliver to her home. [place]  # [phone]

## 2015-05-08 NOTE — Telephone Encounter (Signed)
Pt. Called requesting a med refill on Tylenol # 3. And pt. Also needs ensure. Please f/u with pt.

## 2015-05-09 ENCOUNTER — Ambulatory Visit: Payer: Medicaid Other

## 2015-05-15 ENCOUNTER — Telehealth: Payer: Self-pay

## 2015-05-15 NOTE — Telephone Encounter (Signed)
Pt. Called stating that she has a manual wheelchair but she needs a Rx for an electric wheelchair. Pt. Stated she can not use the manual wheelchair. Please f/u with pt.

## 2015-05-15 NOTE — Telephone Encounter (Signed)
Spoke with patient and she is aware her RX for electric wheel chair Is ready and can be picked up at front desk

## 2015-05-16 ENCOUNTER — Ambulatory Visit: Payer: Medicaid Other

## 2015-05-17 ENCOUNTER — Telehealth: Payer: Self-pay | Admitting: Internal Medicine

## 2015-05-17 NOTE — Telephone Encounter (Signed)
Pt. Is calling to get a medication refill for pain....patient continues to have pain on her arm...please follow up

## 2015-05-17 NOTE — Telephone Encounter (Signed)
Returned patient phone call Patient is aware we are Not refilling her tylenol #3 A referral was placed for ortho Call transferred to speak with referral coordinator

## 2015-05-23 ENCOUNTER — Ambulatory Visit: Payer: Medicaid Other

## 2015-06-30 ENCOUNTER — Encounter: Payer: Self-pay | Admitting: Internal Medicine

## 2015-06-30 ENCOUNTER — Ambulatory Visit: Payer: Medicaid Other | Attending: Internal Medicine | Admitting: Internal Medicine

## 2015-06-30 ENCOUNTER — Encounter: Payer: Self-pay | Admitting: Clinical

## 2015-06-30 VITALS — BP 125/85 | HR 60 | Temp 98.3°F | Resp 16 | Ht 66.0 in | Wt 177.8 lb

## 2015-06-30 DIAGNOSIS — W57XXXA Bitten or stung by nonvenomous insect and other nonvenomous arthropods, initial encounter: Secondary | ICD-10-CM | POA: Insufficient documentation

## 2015-06-30 DIAGNOSIS — Z79899 Other long term (current) drug therapy: Secondary | ICD-10-CM | POA: Insufficient documentation

## 2015-06-30 DIAGNOSIS — F1010 Alcohol abuse, uncomplicated: Secondary | ICD-10-CM | POA: Insufficient documentation

## 2015-06-30 DIAGNOSIS — T148 Other injury of unspecified body region: Secondary | ICD-10-CM | POA: Diagnosis not present

## 2015-06-30 DIAGNOSIS — S40869A Insect bite (nonvenomous) of unspecified upper arm, initial encounter: Secondary | ICD-10-CM | POA: Insufficient documentation

## 2015-06-30 DIAGNOSIS — S40269A Insect bite (nonvenomous) of unspecified shoulder, initial encounter: Secondary | ICD-10-CM | POA: Insufficient documentation

## 2015-06-30 DIAGNOSIS — M25522 Pain in left elbow: Secondary | ICD-10-CM | POA: Insufficient documentation

## 2015-06-30 DIAGNOSIS — I10 Essential (primary) hypertension: Secondary | ICD-10-CM | POA: Insufficient documentation

## 2015-06-30 MED ORDER — FOLIC ACID 1 MG PO TABS: 1.0000 mg | ORAL_TABLET | Freq: Every day | ORAL | Status: DC

## 2015-06-30 MED ORDER — HYDROCORTISONE 2.5 % EX CREA: TOPICAL_CREAM | Freq: Two times a day (BID) | CUTANEOUS | Status: DC

## 2015-06-30 MED ORDER — METOPROLOL TARTRATE 25 MG PO TABS: ORAL_TABLET | ORAL | Status: DC

## 2015-06-30 MED ORDER — ACETAMINOPHEN-CODEINE #3 300-30 MG PO TABS: 1.0000 | ORAL_TABLET | Freq: Two times a day (BID) | ORAL | Status: DC | PRN

## 2015-06-30 MED ORDER — VITAMIN B-1 100 MG PO TABS: 100.0000 mg | ORAL_TABLET | Freq: Every day | ORAL | Status: DC

## 2015-06-30 NOTE — Progress Notes (Signed)
Patient ID: [name], female   DOB: [birth year], 60 y.o.   MRN: [id number] Subjective:  [name] is a 60 y.o. female with hypertension and alcohol abuse. Patient reports that she has noticed bug bites on her arms and shoulders for the past 2 weeks. Patient states that her and her home health aide have searched for bugs but has been unable to find any. Patient feels like there is something flying in her apartment.  Patient reports that that she has been having left elbow pain for several months and has not heard back from Orthopedics. She is requesting refills on pain medication and a repeat referral.  Current Outpatient Prescriptions  Medication Sig Dispense Refill  . acetaminophen-codeine (TYLENOL #3) 300-30 MG tablet Take 1 tablet by mouth every 12 (twelve) hours as needed for severe pain. 60 tablet 1  . ENSURE (ENSURE) Take 237 mLs by mouth 2 (two) times daily. [id number] mL 12  . folic acid (FOLVITE) 1 MG tablet Take 1 tablet (1 mg total) by mouth daily. 30 tablet 2  . gabapentin (NEURONTIN) 100 MG capsule TAKE ONE CAPSULE BY MOUTH TWICE DAILY 60 capsule 2  . hydrOXYzine (ATARAX/VISTARIL) 25 MG tablet Take 25 mg by mouth at bedtime as needed.    . Ledipasvir-Sofosbuvir (HARVONI) 90-400 MG TABS Take 1 tablet by mouth daily. 28 tablet 2  . metoprolol tartrate (LOPRESSOR) 25 MG tablet Take 1 tablet (25 mg total) by mouth 2 (two) times daily. 60 tablet 2  . thiamine (VITAMIN B-1) 100 MG tablet Take 1 tablet (100 mg total) by mouth daily. 30 tablet 3  . famotidine (PEPCID) 10 MG tablet Take 1 tablet (10 mg total) by mouth 2 (two) times daily. (Patient not taking: Reported on 04/21/2015) 60 tablet 4  . FLUoxetine (PROZAC) 20 MG capsule Take 1 capsule (20 mg total) by mouth daily. (Patient not taking: Reported on 01/19/2015) 30 capsule 3  . HYDROcodone-acetaminophen (NORCO/VICODIN) 5-325 MG per tablet Take 1 tablet by mouth every 4 (four) hours as needed for moderate pain. (Patient not taking:  Reported on 06/30/2015) 15 tablet 0  . hydrocortisone 2.5 % cream Apply topically 2 (two) times daily. Use to affected area 30 g 0   No current facility-administered medications for this visit.    Hypertension ROS: taking medications as instructed, no medication side effects noted, no TIA's, no chest pain on exertion, no dyspnea on exertion, no swelling of ankles and no palpitations. All other systems negative other than what is stated above  Objective:  BP 125/85 mmHg  Pulse 60  Temp(Src) 98.3 F (36.8 C) (Oral)  Resp 16  Ht 5\' 6"  (1.676 m)  Wt 177 lb 12.8 oz (80.65 kg)  BMI 28.71 kg/m2  SpO2 96%  Appearance alert, well appearing, and in no distress, oriented to person, place, and time and overweight. General exam BP noted to be well controlled today in office, S1, S2 normal, no gallop, no murmur, chest clear, no JVD, no HSM, no edema.  Lab review: labs are reviewed, up to date and normal.   Assessment:   [name] was seen today for hypertension and follow-up.  Diagnoses and all orders for this visit:  Essential hypertension -     Refilled metoprolol tartrate (LOPRESSOR) 25 MG tablet; Take 1 tablet (25 mg total) by mouth 2 (two) times daily. Patient blood pressure is stable and may continue on current medication.  Education on diet, exercise, and modifiable risk factors discussed. Will obtain appropriate labs as needed.  Will follow up in 3-6 months.   Left elbow pain -     Ambulatory referral to Orthopedic Surgery -     Refill acetaminophen-codeine (TYLENOL #3) 300-30 MG tablet; Take 1 tablet by mouth every 12 (twelve) hours as needed for severe pain. Patient given the number to Orthopedics so that she may call to schedule appointment. Referral was 2 months ago.   Alcohol abuse -     thiamine (VITAMIN B-1) 100 MG tablet; Take 1 tablet (100 mg total) by mouth daily. -     folic acid (FOLVITE) 1 MG tablet; Take 1 tablet (1 mg total) by mouth daily. Patient states that she has cut  back to only drinking a unstated amount of alcohol every 3 days. Alcohol cessation discussed.  Bug bites -     hydrocortisone 2.5 % cream; Apply topically 2 (two) times daily. Use to affected area I have advised patient to search mattress for bed bugs. She will call terminator for pest control. I spoke with home health aide and patient may need to get rid of mattress.    Plan:   Reviewed diet, exercise and weight control. Recommended sodium restriction. Copy of written low fat low cholesterol diet provided and reviewed. Cardiovascular risk and specific lipid/LDL goals reviewed.    Return in about 3 months (around 09/29/2015) for Hypertension.  Lance Bosch, NP 06/30/2015 10:30 AM

## 2015-06-30 NOTE — Patient Instructions (Signed)
PH# [id number] [name] and [name]

## 2015-06-30 NOTE — Progress Notes (Signed)
Patient's here for f/up HTN. Patient states she's feeling ok.  Patient is concern about outbreak of rashes over body, that's been present x1wk now that's getting worse.  Patient asking about Orthopedic referrral that she never received a phone call for an appt.  Patient requesting refill of Metoprolol, and Folic acid.

## 2015-06-30 NOTE — Progress Notes (Signed)
Depression screen [facility] 2/9 06/30/2015 10/18/2014 05/18/2014 12/15/2013 11/19/2013  Decreased Interest 0 0 1 1 1   Down, Depressed, Hopeless 0 0 1 1 1   PHQ - 2 Score 0 0 2 2 2   Altered sleeping 0 - 1 1 1   Tired, decreased energy 0 - 1 1 3   Change in appetite 1 - 1 1 2   Feeling bad or failure about yourself  - - 1 1 3   Trouble concentrating 0 - 1 1 3   Moving slowly or fidgety/restless 0 - 0 0 3  Suicidal thoughts 0 - 0 0 1  PHQ-9 Score 1 - 7 7 18     GAD 7 : Generalized Anxiety Score 06/30/2015  Nervous, Anxious, on Edge 0  Control/stop worrying 0  Worry too much - different things 0  Trouble relaxing 0  Restless 0  Easily annoyed or irritable 0  Afraid - awful might happen 0  Total GAD 7 Score 0

## 2015-07-07 ENCOUNTER — Other Ambulatory Visit: Payer: Self-pay | Admitting: Internal Medicine

## 2015-08-04 ENCOUNTER — Encounter: Payer: Self-pay | Admitting: Family Medicine

## 2015-08-04 ENCOUNTER — Ambulatory Visit: Payer: Medicaid Other | Attending: Family Medicine | Admitting: Family Medicine

## 2015-08-04 VITALS — BP 110/71 | HR 65 | Temp 98.1°F | Resp 16 | Ht 66.0 in | Wt 176.0 lb

## 2015-08-04 DIAGNOSIS — L282 Other prurigo: Secondary | ICD-10-CM

## 2015-08-04 DIAGNOSIS — Z79899 Other long term (current) drug therapy: Secondary | ICD-10-CM | POA: Insufficient documentation

## 2015-08-04 DIAGNOSIS — M25561 Pain in right knee: Secondary | ICD-10-CM | POA: Diagnosis not present

## 2015-08-04 DIAGNOSIS — G8929 Other chronic pain: Secondary | ICD-10-CM | POA: Insufficient documentation

## 2015-08-04 DIAGNOSIS — Z1231 Encounter for screening mammogram for malignant neoplasm of breast: Secondary | ICD-10-CM

## 2015-08-04 DIAGNOSIS — L299 Pruritus, unspecified: Secondary | ICD-10-CM | POA: Diagnosis not present

## 2015-08-04 DIAGNOSIS — D696 Thrombocytopenia, unspecified: Secondary | ICD-10-CM

## 2015-08-04 DIAGNOSIS — W57XXXA Bitten or stung by nonvenomous insect and other nonvenomous arthropods, initial encounter: Secondary | ICD-10-CM | POA: Diagnosis not present

## 2015-08-04 DIAGNOSIS — T148 Other injury of unspecified body region: Secondary | ICD-10-CM

## 2015-08-04 DIAGNOSIS — Z0000 Encounter for general adult medical examination without abnormal findings: Secondary | ICD-10-CM | POA: Diagnosis not present

## 2015-08-04 DIAGNOSIS — I69193 Ataxia following nontraumatic intracerebral hemorrhage: Secondary | ICD-10-CM

## 2015-08-04 DIAGNOSIS — M25569 Pain in unspecified knee: Secondary | ICD-10-CM

## 2015-08-04 DIAGNOSIS — F17210 Nicotine dependence, cigarettes, uncomplicated: Secondary | ICD-10-CM | POA: Insufficient documentation

## 2015-08-04 DIAGNOSIS — R270 Ataxia, unspecified: Secondary | ICD-10-CM | POA: Diagnosis not present

## 2015-08-04 DIAGNOSIS — R21 Rash and other nonspecific skin eruption: Secondary | ICD-10-CM | POA: Diagnosis present

## 2015-08-04 MED ORDER — HYDROCORTISONE 2.5 % EX CREA: TOPICAL_CREAM | Freq: Two times a day (BID) | CUTANEOUS | Status: DC

## 2015-08-04 MED ORDER — DICLOFENAC SODIUM 1 % TD GEL: 2.0000 g | Freq: Four times a day (QID) | TRANSDERMAL | Status: DC

## 2015-08-04 MED ORDER — HYDROXYZINE HCL 25 MG PO TABS: 25.0000 mg | ORAL_TABLET | Freq: Four times a day (QID) | ORAL | Status: DC | PRN

## 2015-08-04 NOTE — Assessment & Plan Note (Signed)
PT referral.

## 2015-08-04 NOTE — Progress Notes (Signed)
F/U bed bug Rash on face  No pain today, itchy and uncomfortable  No suicidal thought in the past two weeks

## 2015-08-04 NOTE — Progress Notes (Signed)
Subjective:  Patient ID: [name], female    DOB: 09-Mar-1956  Age: 60 y.o. MRN: [id number]  CC: Insect Bite   HPI [name] has hx of hep C, alcohol abuse, intracerebral hemorrhage presents for    1. Itching: x 2 months. With rash on body. Had a exterminator come to her home and was told that there were beg bugs in her home. She not yet received a new mattress. She is using hydrocortisone cream which helps with itching, she is also using topical alcohol. She is not using hydroxyzine. She drinks one beer per day, 16 oz.   2. R knee pain: she has chronic knee pain. Pain is worsening with walking. She walks with a crutch due to her ataxia from her intercerebral hemorrhage. She is in need for PT eval to possibly get a mechanical wheelchair.   3. HM: amenable to completing all health maintenance items.   Social History  Substance Use Topics  . Smoking status: Current Every Day Smoker -- 1.00 packs/day for 40 years    Types: Cigarettes  . Smokeless tobacco: Never Used     Comment: cutting back  . Alcohol Use: 4.2 oz/week    7 Cans of beer per week     Comment: 1 beer/day   Outpatient Prescriptions Prior to Visit  Medication Sig Dispense Refill  . acetaminophen-codeine (TYLENOL #3) 300-30 MG tablet Take 1 tablet by mouth every 12 (twelve) hours as needed for severe pain. 60 tablet 1  . ENSURE (ENSURE) Take 237 mLs by mouth 2 (two) times daily. 237 mL 12  . famotidine (PEPCID) 10 MG tablet Take 1 tablet (10 mg total) by mouth 2 (two) times daily. 60 tablet 4  . FLUoxetine (PROZAC) 20 MG capsule Take 1 capsule (20 mg total) by mouth daily. 30 capsule 3  . folic acid (FOLVITE) 1 MG tablet Take 1 tablet (1 mg total) by mouth daily. 30 tablet 2  . gabapentin (NEURONTIN) 100 MG capsule TAKE ONE CAPSULE BY MOUTH TWICE DAILY 60 capsule 2  . HYDROcodone-acetaminophen (NORCO/VICODIN) 5-325 MG per tablet Take 1 tablet by mouth every 4 (four) hours as needed for moderate pain. 15  tablet 0  . hydrocortisone 2.5 % cream Apply topically 2 (two) times daily. Use to affected area 30 g 0  . hydrOXYzine (ATARAX/VISTARIL) 25 MG tablet Take 25 mg by mouth at bedtime as needed.    . Ledipasvir-Sofosbuvir (HARVONI) 90-400 MG TABS Take 1 tablet by mouth daily. 28 tablet 2  . metoprolol tartrate (LOPRESSOR) 25 MG tablet Take 1 tablet (25 mg total) by mouth 2 (two) times daily. 60 tablet 2  . thiamine (VITAMIN B-1) 100 MG tablet Take 1 tablet (100 mg total) by mouth daily. 30 tablet 3   No facility-administered medications prior to visit.    ROS Review of Systems  Constitutional: Negative for fever and chills.  Eyes: Negative for visual disturbance.  Respiratory: Negative for shortness of breath.   Cardiovascular: Negative for chest pain.  Gastrointestinal: Negative for abdominal pain and blood in stool.  Musculoskeletal: Positive for arthralgias and gait problem. Negative for back pain.  Skin: Positive for rash.  Allergic/Immunologic: Negative for immunocompromised state.  Hematological: Negative for adenopathy. Does not bruise/bleed easily.  Psychiatric/Behavioral: Negative for suicidal ideas and dysphoric mood.    Objective:  BP 110/71 mmHg  Pulse 65  Temp(Src) 98.1 F (36.7 C) (Oral)  Resp 16  Ht 5\' 6"  (1.676 m)  Wt 176 lb (79.833 kg)  BMI 28.42 kg/m2  SpO2 95%  BP/Weight 08/04/2015 [id number]  Systolic BP [id number]  Diastolic BP 71 85 88  Wt. (Lbs) 176 177.8 172.4  BMI 28.42 28.71 27.84   Physical Exam  Constitutional: She is oriented to person, place, and time. She appears well-developed and well-nourished. No distress.  HENT:  Head: Normocephalic and atraumatic.  Cardiovascular: Normal rate, regular rhythm, normal heart sounds and intact distal pulses.   Pulmonary/Chest: Effort normal and breath sounds normal.  Musculoskeletal: She exhibits no edema.  Neurological: She is alert and oriented to person, place, and time.  Skin: Skin is warm and  dry. Rash noted. Rash is maculopapular.  Slightly erythematous rash on extremities with evidence of excoriation   Psychiatric: She has a normal mood and affect. Her speech is rapid and/or pressured.    Assessment & Plan:   There are no diagnoses linked to this encounter.  [name] was seen today for insect bite.  Diagnoses and all orders for this visit:  Chronic knee pain, right -     diclofenac sodium (VOLTAREN) 1 % GEL; Apply 2 g topically 4 (four) times daily.  Pruritic rash -     hydrOXYzine (ATARAX/VISTARIL) 25 MG tablet; Take 1 tablet (25 mg total) by mouth every 6 (six) hours as needed for itching. -     Hepatic Function Panel; Future -     hydrocortisone 2.5 % cream; Apply topically 2 (two) times daily. Use to affected area  Thrombocytopenia, unspecified ([place]) -     CBC; Future  Healthcare maintenance -     Ambulatory referral to Gastroenterology  Visit for screening mammogram -     MM DIGITAL SCREENING BILATERAL; Future  Ataxia due to old intracerebral hemorrhage -     Ambulatory referral to Physical Therapy  Bug bites -     Discontinue: hydrocortisone 2.5 % cream; Apply topically 2 (two) times daily. Use to affected area   No orders of the defined types were placed in this encounter.    Follow-up: No Follow-up on file.   Boykin Nearing MD

## 2015-08-04 NOTE — Assessment & Plan Note (Addendum)
Pruritic rash Bug bite likely  Refilled hydrocortisone cream and atarax

## 2015-08-04 NOTE — Patient Instructions (Addendum)
[name] was seen today for insect bite.  Diagnoses and all orders for this visit:  Chronic knee pain, right -     diclofenac sodium (VOLTAREN) 1 % GEL; Apply 2 g topically 4 (four) times daily.  Pruritic rash -     hydrOXYzine (ATARAX/VISTARIL) 25 MG tablet; Take 1 tablet (25 mg total) by mouth every 6 (six) hours as needed for itching. -     Hepatic Function Panel; Future -     hydrocortisone 2.5 % cream; Apply topically 2 (two) times daily. Use to affected area  Thrombocytopenia, unspecified ([place]) -     CBC; Future  Healthcare maintenance -     Ambulatory referral to Gastroenterology  Visit for screening mammogram -     MM DIGITAL SCREENING BILATERAL; Future  Ataxia due to old intracerebral hemorrhage -     Ambulatory referral to Physical Therapy  Bug bites -     Discontinue: hydrocortisone 2.5 % cream; Apply topically 2 (two) times daily. Use to affected area    F/u in 6-8 weeks for pap smear   Dr. Adrian Blackwater

## 2015-08-07 ENCOUNTER — Other Ambulatory Visit: Payer: Self-pay | Admitting: Family Medicine

## 2015-08-07 DIAGNOSIS — Z1231 Encounter for screening mammogram for malignant neoplasm of breast: Secondary | ICD-10-CM

## 2015-08-16 ENCOUNTER — Other Ambulatory Visit: Payer: Self-pay | Admitting: Internal Medicine

## 2015-08-21 ENCOUNTER — Other Ambulatory Visit: Payer: Self-pay | Admitting: Internal Medicine

## 2015-08-21 ENCOUNTER — Ambulatory Visit
Admission: RE | Admit: 2015-08-21 | Discharge: 2015-08-21 | Disposition: A | Discharge status: Home / Self Care | Payer: Medicaid Other | Source: Ambulatory Visit | Attending: Family Medicine | Admitting: Family Medicine

## 2015-08-21 DIAGNOSIS — Z1231 Encounter for screening mammogram for malignant neoplasm of breast: Secondary | ICD-10-CM

## 2015-08-23 ENCOUNTER — Other Ambulatory Visit: Payer: Self-pay | Admitting: Family Medicine

## 2015-08-23 DIAGNOSIS — R928 Other abnormal and inconclusive findings on diagnostic imaging of breast: Secondary | ICD-10-CM

## 2015-08-26 ENCOUNTER — Other Ambulatory Visit: Payer: Self-pay | Admitting: Family Medicine

## 2015-08-29 ENCOUNTER — Other Ambulatory Visit: Payer: Self-pay | Admitting: Family Medicine

## 2015-08-29 ENCOUNTER — Other Ambulatory Visit: Payer: Self-pay

## 2015-08-29 DIAGNOSIS — R928 Other abnormal and inconclusive findings on diagnostic imaging of breast: Secondary | ICD-10-CM

## 2015-08-30 ENCOUNTER — Other Ambulatory Visit: Payer: Medicaid Other

## 2015-08-30 ENCOUNTER — Inpatient Hospital Stay
Admission: RE | Admit: 2015-08-30 | Discharge: 2015-08-30 | Disposition: A | Discharge status: Home / Self Care | Payer: Medicaid Other | Source: Ambulatory Visit | Attending: Family Medicine | Admitting: Family Medicine

## 2015-09-01 ENCOUNTER — Other Ambulatory Visit: Payer: Medicaid Other | Admitting: Family Medicine

## 2015-09-04 ENCOUNTER — Other Ambulatory Visit: Payer: Medicaid Other

## 2015-09-07 ENCOUNTER — Ambulatory Visit: Payer: Medicaid Other | Attending: Internal Medicine | Admitting: Physical Therapy

## 2015-09-08 ENCOUNTER — Telehealth: Payer: Self-pay | Admitting: Family Medicine

## 2015-09-08 ENCOUNTER — Other Ambulatory Visit: Payer: Self-pay | Admitting: Family Medicine

## 2015-09-08 NOTE — Telephone Encounter (Signed)
Pt. Called stating that her feet are swelling and that she needs an Rx for Tramadol because she has pain on her knees.  Pt. Stated that Tylenol # 3 made her stomach upset. Please f/u

## 2015-09-09 ENCOUNTER — Other Ambulatory Visit: Payer: Self-pay | Admitting: Internal Medicine

## 2015-09-12 ENCOUNTER — Ambulatory Visit
Admission: RE | Admit: 2015-09-12 | Discharge: 2015-09-12 | Disposition: A | Discharge status: Home / Self Care | Payer: Medicaid Other | Source: Ambulatory Visit | Attending: Family Medicine | Admitting: Family Medicine

## 2015-09-12 DIAGNOSIS — R928 Other abnormal and inconclusive findings on diagnostic imaging of breast: Secondary | ICD-10-CM

## 2015-09-15 ENCOUNTER — Other Ambulatory Visit: Payer: Self-pay | Admitting: Family Medicine

## 2015-09-18 ENCOUNTER — Other Ambulatory Visit: Payer: Self-pay | Admitting: Internal Medicine

## 2015-09-21 ENCOUNTER — Other Ambulatory Visit: Payer: Self-pay | Admitting: Family Medicine

## 2015-09-21 NOTE — Telephone Encounter (Signed)
Please call back to patient  Patient advised to take aleve for swelling and elevate feet Needs OV for exam prior to prescribing medication

## 2015-09-25 NOTE — Telephone Encounter (Signed)
Called pt. Phone continued to ring without voicemail.

## 2015-09-26 NOTE — Telephone Encounter (Signed)
Called pt. Pt verified name and date of birth. Pt notified that she should take aleve for swelling and elevate her feet. Pt also notified that she needs to make an appt for an OV for examination in order to received the medications requested. Pt reports both of her feet are now 3x their normal size. Suggested pt make an appt to be seen this week or go to urgent care. Pt voiced understanding and transferred to the front to make an appt.

## 2015-09-28 ENCOUNTER — Ambulatory Visit: Payer: Medicaid Other | Attending: Family Medicine | Admitting: Physician Assistant

## 2015-09-28 VITALS — BP 115/76 | HR 69 | Temp 98.2°F | Resp 16 | Ht 66.0 in | Wt 182.0 lb

## 2015-09-28 DIAGNOSIS — M25579 Pain in unspecified ankle and joints of unspecified foot: Secondary | ICD-10-CM | POA: Diagnosis not present

## 2015-09-28 DIAGNOSIS — Z79899 Other long term (current) drug therapy: Secondary | ICD-10-CM | POA: Insufficient documentation

## 2015-09-28 DIAGNOSIS — E119 Type 2 diabetes mellitus without complications: Secondary | ICD-10-CM | POA: Insufficient documentation

## 2015-09-28 DIAGNOSIS — R600 Localized edema: Secondary | ICD-10-CM | POA: Diagnosis not present

## 2015-09-28 DIAGNOSIS — Z8673 Personal history of transient ischemic attack (TIA), and cerebral infarction without residual deficits: Secondary | ICD-10-CM | POA: Insufficient documentation

## 2015-09-28 LAB — BASIC METABOLIC PANEL
BUN: 19 mg/dL (ref 7–25)
CALCIUM: 9.2 mg/dL (ref 8.6–10.4)
CO2: 26 mmol/L (ref 20–31)
CREATININE: 0.86 mg/dL (ref 0.50–1.05)
Chloride: 104 mmol/L (ref 98–110)
GLUCOSE: 104 mg/dL — AB (ref 65–99)
Potassium: 3.9 mmol/L (ref 3.5–5.3)
SODIUM: 140 mmol/L (ref 135–146)

## 2015-09-28 MED ORDER — FUROSEMIDE 20 MG PO TABS: 20.0000 mg | ORAL_TABLET | Freq: Every day | ORAL | Status: AC

## 2015-09-28 MED ORDER — ACETAMINOPHEN-CODEINE #3 300-30 MG PO TABS: 1.0000 | ORAL_TABLET | ORAL | Status: DC | PRN

## 2015-09-28 MED ORDER — FOLIC ACID 1 MG PO TABS: 1.0000 mg | ORAL_TABLET | Freq: Every day | ORAL | Status: DC

## 2015-09-28 MED ORDER — FUROSEMIDE 20 MG PO TABS: 20.0000 mg | ORAL_TABLET | Freq: Every day | ORAL | Status: DC

## 2015-09-28 MED FILL — FOLIC ACID 1 MG TABLET: 1 | 30 days supply | Qty: 30 | Fill #0

## 2015-09-28 NOTE — Progress Notes (Signed)
Patient ID: [name], female   DOB: 02-10-1956, 60 y.o.   MRN: [id number]   Niranjana Holzer, is a 60 y.o. female  [id number]  [id number]  DOB - 1955-06-19  Chief Complaint  Patient presents with  . Foot Swelling    Onset 2 weeks and ankles        Subjective:  Chief Complaint and HPI: [name] is a 60 y.o. female here today for B foot swelling and pain.  The swelling started about 2 weeks ago and has worsened.  She denies any precipitating factors.  No SOB/CP.  +long term smoker.  Needs folic acid and ensure while she is here.  Requests Tylenol #3 for pain.  Says she hasn't had any narcotic pain meds since earlier in the spring.    ROS:   Constitutional:  No f/c, No night sweats, No unexplained weight loss. EENT:  No vision changes, No blurry vision, No hearing changes. No mouth, throat, or ear problems.  Respiratory: No cough, No SOB Cardiac: No CP, no palpitations GI:  No abd pain, No N/V/D. GU: No Urinary s/sx Musculoskeletal: No joint pain Neuro: No headache, no dizziness, no motor weakness.  Skin: No rash Endocrine:  No polydipsia. No polyuria.  Psych: Denies SI/HI  No problems updated.  ALLERGIES: Allergies  Allergen Reactions  . Ibuprofen Hives and Other (See Comments)    Burning of the skin.    PAST MEDICAL HISTORY: Past Medical History  Diagnosis Date  . Hepatitis C   . Pneumonia   . Hypertension   . Diabetes mellitus without complication ([place])   . Stroke [facility])     MEDICATIONS AT HOME: Prior to Admission medications   Medication Sig Start Date End Date Taking? Authorizing Provider  ENSURE (ENSURE) Take 237 mLs by mouth 2 (two) times daily. 02/13/15  Yes Lance Bosch, NP  folic acid (FOLVITE) 1 MG tablet Take 1 tablet (1 mg total) by mouth daily. 09/28/15  Yes Dionne Bucy Derward Marple, PA-C  gabapentin (NEURONTIN) 100 MG capsule TAKE ONE CAPSULE BY MOUTH TWICE DAILY 09/11/15  Yes Josalyn Funches, MD  hydrocortisone 2.5 % cream Apply  topically 2 (two) times daily. Use to affected area 08/04/15  Yes Josalyn Funches, MD  hydrOXYzine (ATARAX/VISTARIL) 25 MG tablet TAKE ONE TABLET BY MOUTH EVERY 6 HOURS AS NEEDED FOR ITCHING 09/08/15  Yes Josalyn Funches, MD  metoprolol tartrate (LOPRESSOR) 25 MG tablet TAKE ONE TABLET BY MOUTH TWICE DAILY 08/16/15  Yes Josalyn Funches, MD  thiamine (VITAMIN B-1) 100 MG tablet TAKE ONE TABLET BY MOUTH EVERY DAY 09/18/15  Yes Josalyn Funches, MD  acetaminophen-codeine (TYLENOL #3) 300-30 MG tablet Take 1 tablet by mouth every 4 (four) hours as needed for moderate pain. 09/28/15   Argentina Donovan, PA-C  furosemide (LASIX) 20 MG tablet Take 1 tablet (20 mg total) by mouth daily. X 1 week then prn foot swelling 09/28/15   Dionne Bucy Janet Decesare, PA-C  Ledipasvir-Sofosbuvir (HARVONI) 90-400 MG TABS Take 1 tablet by mouth daily. Patient not taking: Reported on 09/28/2015 06/07/14   Thayer Headings, MD  VOLTAREN 1 % GEL Apply 2 g topically 4 (four) times daily. Patient not taking: Reported on 09/28/2015 09/21/15   Boykin Nearing, MD     Objective:  EXAM:   Filed Vitals:   09/28/15 1136  BP: 115/76  Pulse: 69  Temp: 98.2 F (36.8 C)  TempSrc: Oral  Resp: 16  Height: 5\' 6"  (1.676 m)  Weight: 182 lb (82.555 kg)  SpO2: 94%    General appearance : A&OX3. NAD. Non-toxic-appearing.  Ambulates with a walker.   HEENT: Atraumatic and Normocephalic.  PERRLA. EOM intact.  TM clear B. Neck: supple, no JVD. No cervical lymphadenopathy. No thyromegaly Chest/Lungs:  Breathing-non-labored, Good air entry bilaterally, breath sounds normal without rales, rhonchi, or wheezing  CVS: S1 S2 regular, no murmurs, gallops, rubs  Extremities: Both legs are warm to touch with = pulse throughout.  There is pedal edema 2-3+ and mild pretibial edema without calf TTP or pitting.  Neurology:  CN II-XII grossly intact, Non focal.   Psych:   Mild speech impediment. Appropriate eye contact and affect.  Skin:  No Rash  Data Review Lab  Results  Component Value Date   HGBA1C 5.3 10/18/2014   HGBA1C 5.0 03/30/2013     Assessment & Plan   1. Localized edema Dependent edema-advised compression stockings and elevation.  Decrease salt in diet.  Increase water intake. Smoking cessation advised.  I am sure she has vessel damage in general due to long-standing smoking.  - Basic metabolic panel - furosemide (LASIX) 20 MG tablet; Take 1 tablet (20 mg total) by mouth daily. X 1 week then prn foot swelling  Dispense: 30 tablet; Refill: 0  2. Pain in joint, ankle and foot, unspecified laterality - acetaminophen-codeine (TYLENOL #3) 300-30 MG tablet; Take 1 tablet by mouth every 4 (four) hours as needed for moderate pain.  Dispense: 20 tablet; Refill: 0  Patient have been counseled extensively about nutrition and exercise  Return in about 2 months (around 11/28/2015).  The patient was given clear instructions to go to ER or return to medical center if symptoms don't improve, worsen or new problems develop. The patient verbalized understanding. The patient was told to call to get lab results if they haven't heard anything in the next week.     Freeman Caldron, PA-C [facility] [place] [facility], [place]   09/28/2015, 12:14 PM

## 2015-10-02 ENCOUNTER — Other Ambulatory Visit: Payer: Self-pay | Admitting: Family Medicine

## 2015-10-24 ENCOUNTER — Other Ambulatory Visit: Payer: Self-pay | Admitting: Family Medicine

## 2015-10-24 NOTE — Telephone Encounter (Signed)
Rx Requests 

## 2015-10-26 ENCOUNTER — Other Ambulatory Visit: Payer: Self-pay | Admitting: Internal Medicine

## 2015-10-26 DIAGNOSIS — F1010 Alcohol abuse, uncomplicated: Secondary | ICD-10-CM

## 2015-10-26 NOTE — Telephone Encounter (Signed)
Rx Request 

## 2015-11-02 ENCOUNTER — Ambulatory Visit: Payer: Medicaid Other | Attending: Internal Medicine | Admitting: Physical Therapy

## 2015-11-02 DIAGNOSIS — R2689 Other abnormalities of gait and mobility: Secondary | ICD-10-CM

## 2015-11-03 NOTE — Therapy (Signed)
[facility] [address] [place], [place], [address] Phone: [phone]   Fax:  [phone]  Physical Therapy Treatment  Patient Details  Name: [name] MRN: [id number] Date of Birth: [birth year] Referring Provider: Angelica Chessman, MD  Encounter Date: 11/02/2015      PT End of Session - 11/03/15 1543    PT Start Time 2536   PT Stop Time 6440   PT Time Calculation (min) 65 min      Past Medical History:  Diagnosis Date  . Diabetes mellitus without complication ([place])   . Hepatitis C   . Hypertension   . Pneumonia   . Stroke [facility])     Past Surgical History:  Procedure Laterality Date  . CRANIECTOMY N/A 08/18/2013   Procedure: CRANIECTOMY POSTERIOR FOSSA DECOMPRESSION, EVACUATION OF HEMORRHAGE;  Surgeon: Consuella Lose, MD;  Location: [facility];  Service: Neurosurgery;  Laterality: N/A;  . NECK SURGERY    . SHOULDER SURGERY Bilateral     There were no vitals filed for this visit.        Mobility/Seating Evaluation    PATIENT INFORMATION: Name: [name] DOB: [birth year]  Sex: Female Date seen: 11/02/15 Time: :32  Address:  [place] [address] Physician: Angelica Chessman, MD This evaluation/justification form will serve as the LMN for the following suppliers: __________________________ Supplier: [facility] Contact Person: Yvone Neu Phone:  [phone]   Seating Therapist: Janalee Dane, [place] Phone:   [phone]   Phone: [phone] (Home) [phone] (Mobile) *Preferred*    Spouse/Parent/Caregiver name: Perle Brickhouse, daughter  Phone number: [phone] Insurance/Payer: Medicaid     Reason for Referral: new wheelchair  Patient/Caregiver Goals: new wheelchair for better mobility.  Patient was seen for face-to-face evaluation for new power wheelchair.  Also present was [employer] to discuss recommendations and wheelchair  options.  Further paperwork was completed and sent to vendor.  Patient appears to qualify for power mobility device at this time per objective findings.   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 Explain recent changes or trends in weight: pt reports fluid in lower extremities with fluctuations in weight, 160-205 lbs.   History including Falls: Fall with wrist fracture 10/2014; multiple near falls, with reports of 3-4 falls per week.     HOME ENVIRONMENT: '[]' House  '[]' Condo/town home  '[x]' Apartment  '[]' Assisted Living    '[x]' Lives Alone '[]'  Lives with Others                                                                                          Hours with caregiver: CNA 2hrs/day  '[x]' Home is accessible to patient           Stairs      '[]' Yes '[x]'  No     Ramp '[]' Yes '[x]' No Comments:  ?????  COMMUNITY ADL: TRANSPORTATION: '[x]' Car    '[x]' Van    '[]' Public Transportation    '[]' Adapted w/c Lift    '[]' Ambulance    '[]' Other:       '[]' Sits in wheelchair during transport  Employment/School: ????? Specific requirements pertaining to mobility ?????  Other: ?????    FUNCTIONAL/SENSORY PROCESSING SKILLS:  Handedness:   '[]' Right     '[x]' Left    '[]' NA  Comments:  ?????  Functional Processing Skills for Wheeled Mobility '[]' Processing Skills are adequate for safe wheelchair operation  Areas of concern than may interfere with safe operation of wheelchair Description of problem   '[]'  Attention to environment      '[]' Judgment      '[]'  Hearing  '[]'  Vision or visual processing      '[]' Motor Planning  '[]'  Fluctuations in Behavior  ?????    VERBAL  COMMUNICATION: '[x]' WFL receptive '[x]'  WFL expressive '[]' Understandable  '[]' Difficult to understand  '[]' non-communicative '[]'  Uses an augmented communication device  CURRENT SEATING / MOBILITY: Current Mobility Base:  '[]' None '[]' Dependent '[x]' Manual '[]' Scooter '[]' Power  Type of Control: ?????  Manufacturer:  DriveSize:  ?????Age: approx 7 months  Current Condition of Mobility Base:  ?????   Current Wheelchair components:  ?????  Describe posture in present seating system:  Unable to assess, as pt presents to therapy with platform RW.  Pt reports she does not use this wheelchair, due to arthritis in her hands.      SENSATION and SKIN ISSUES: Sensation '[x]' Intact  '[]' Impaired '[]' Absent  Level of sensation: ????? Pressure Relief: Able to perform effective pressure relief :    '[x]' Yes  '[]'  No Method: Change of positions/seating within home If not, Why?: ?????  Skin Issues/Skin Integrity Current Skin Issues  '[]' Yes '[x]' No '[]' Intact '[]'  Red area'[]'  Open Area  '[]' Scar Tissue '[]' At risk from prolonged sitting Where  ?????  History of Skin Issues  '[]' Yes '[x]' No Where  ????? When  ?????  Hx of skin flap surgeries  '[]' Yes '[x]' No Where  ????? When  ?????  Limited sitting tolerance '[]' Yes '[x]' No Hours spent sitting in wheelchair daily: ?????  Complaint of Pain:  Please describe: Pt c/o pain in R knee.  Pt rates pain as 10/10, with pain in R knee limiting mobility, reporting R knee feels    Swelling/Edema: Pitting edema (1+) in bilateral feets   ADL STATUS (in reference to wheelchair use):  Indep Assist Unable Indep with Equip Not assessed Comments  Dressing ????? ????? ????? X ????? Performs sitting or lying down  Eating X ????? ????? X ????? ?????  Toileting ????? ????? ????? ????? ????? ?????  Bathing ????? X ????? ????? ????? CNA assistance  Grooming/Hygiene ????? ????? ????? X ????? Holds onto walker-reports history of fall in the bathroom  Meal Prep ????? ????? X ????? ????? ?????  IADLS ????? ????? ????? ?????  X ?????  Bowel Management: '[x]' Continent  '[]' Incontinent  '[]' Accidents Comments:  ?????  Bladder Management: '[]' Continent  '[]' Incontinent  '[x]' Accidents Comments:  Has trouble getting to the bathroom in timely manner; often has accidents     WHEELCHAIR SKILLS: Manual w/c Propulsion: '[]' UE or LE strength and endurance sufficient to participate in ADLs using manual wheelchair Arm : '[]' left '[]' right   '[]' Both      Distance: ????? Foot:  '[]' left '[]' right   '[]' Both  Operate Scooter: '[]'  Strength, hand grip, balance and transfer appropriate for use '[]' Living environment is accessible for use of scooter  Operate Power w/c:  '[]'  Std. Joystick   '[]'  Alternative Controls Indep '[]'   Assist '[]'  Dependent/unable '[]'  N/A '[]'   '[]' Safe          '[]'  Functional      Distance: ?????  Bed confined without wheelchair '[]'  Yes '[]'  No   STRENGTH/RANGE OF MOTION:  Active Range of Motion Strength  Shoulder WFL abduction and flexion 5/5 abduction, flexion bilateral  Elbow WFL 5/5 biceps and triceps  Wrist/Hand WFL 4/5 wrist flex/ext bilateral  Hip WFL 4/5 hip flexion  Knee WFL 4/5 quads RLE, 5/5 LLE; 5/5 RLE and LLE hamstrings  Ankle WFL 5/5 ankle dorsiflexion     MOBILITY/BALANCE:  '[]'  Patient is totally dependent for mobility  ?????    Balance Transfers Ambulation  Sitting Balance: Standing Balance: '[x]'  Independent '[]'  Independent/Modified Independent  '[x]'  WFL     '[]'  WFL '[]'  Supervision '[]'  Supervision  '[]'  Uses UE for balance  '[]'  Supervision '[]'  Min Assist '[]'  Ambulates with Assist  ?????    '[]'  Min Assist '[]'  Min assist '[]'  Mod Assist '[x]'  Ambulates with Device:      '[x]'  RW  '[]'  StW  '[]'  Cane  '[]'  with RLE platform  '[]'  Mod Assist '[]'  Mod assist '[]'  Max assist   '[]'  Max Assist '[]'  Max assist '[]'  Dependent '[]'  Indep. Short Distance Only  '[]'  Unable '[]'  Unable '[]'  Lift / Sling Required Distance (in feet)  ?????   '[]'  Sliding board '[]'  Unable to Ambulate (see explanation below)  Cardio Status:  '[]' Intact  '[x]'  Impaired   '[]'  NA     HTN  Respiratory  Status:  '[x]' Intact   '[]' Impaired   '[]' NA     ?????  Orthotics/Prosthetics: NA  Comments (Address manual vs power w/c vs scooter): TUG score:  36.66 sec with platform RW (>13.5 sec indicates fall risk; greater than 30 sec indicates difficulty with ADLs in home), gait velocity 1.33 ft/sec (indicates increased fall risk when gait velocity <1.8 ft/sec).  Pt reports R and L wrist arthritis, with hands locking up when trying to propel manual wheelchair.  With short distance gait, pt reports R knee pain and R knee gives way at times.  Pt at high risk of falls, with history of falls, and pt would benefit from power wheelchair for independent and safe mobility within home.         Anterior / Posterior Obliquity Rotation-Pelvis ?????  PELVIS    '[x]'  '[]'  '[]'   Neutral Posterior Anterior  '[x]'  '[]'  '[]'   WFL Rt elev Lt elev  '[x]'  '[]'  '[]'   WFL Right Left                      Anterior    Anterior     '[]'  Fixed '[]'  Other '[]'  Partly Flexible '[x]'  Flexible   '[]'  Fixed '[]'  Other '[]'  Partly Flexible  '[x]'  Flexible  '[]'  Fixed '[]'  Other '[]'  Partly Flexible  '[x]'  Flexible   TRUNK  '[x]'  '[]'  '[]'   WFL ? Thoracic ? Lumbar  Kyphosis Lordosis  '[x]'  '[]'  '[]'   WFL Convex Convex  Right Left '[]' c-curve '[]' s-curve '[]' multiple  '[x]'  Neutral '[]'  Left-anterior '[]'  Right-anterior     '[]'  Fixed '[]'  Flexible '[]'  Partly Flexible '[]'  Other  '[]'  Fixed '[]'  Flexible '[]'  Partly Flexible '[]'  Other  '[]'  Fixed             '[]'  Flexible '[]'  Partly Flexible '[]'  Other    Position Windswept  ?????  HIPS          '[x]'            '[]'               '[]'   Neutral       Abduct        ADduct         '[x]'           '[]'            '[]'   Neutral Right           Left      '[]'  Fixed '[]'  Subluxed '[]'  Partly Flexible '[]'  Dislocated '[x]'  Flexible  '[]'  Fixed '[]'  Other '[]'  Partly Flexible  '[]'  Flexible                 Foot Positioning Knee Positioning  ?????    '[x]'  WFL  '[]' Lt '[]' Rt '[x]'  WFL  '[]' Lt '[]' Rt    KNEES ROM concerns: ROM concerns:    & Dorsi-Flexed '[]' Lt '[]' Rt ?????    FEET Plantar  Flexed '[]' Lt '[]' Rt      Inversion                 '[]' Lt '[]' Rt      Eversion                 '[]' Lt '[]' Rt     HEAD '[x]'  Functional '[x]'  Good Head Control  ?????  & '[]'  Flexed         '[]'  Extended '[]'  Adequate Head Control    NECK '[]'  Rotated  Lt  '[]'  Lat Flexed Lt '[]'  Rotated  Rt '[]'  Lat Flexed Rt '[]'  Limited Head Control     '[]'  Cervical Hyperextension '[]'  Absent  Head Control     SHOULDERS ELBOWS WRIST& HAND ?????      Left     Right    Left     Right    Left     Right   U/E '[x]' Functional           '[x]' Functional ????? ????? '[]' Fisting             '[]' Fisting      '[]' elev   '[]' dep      '[]' elev   '[]' dep       '[]' pro -'[]' retract     '[]' pro  '[]' retract '[]' subluxed             '[]' subluxed           Goals for Wheelchair Mobility  '[x]'  Independence with mobility in the home with motor related ADLs (MRADLs)  '[]'  Independence with MRADLs in the community '[]'  Provide dependent mobility  '[]'  Provide recline     '[]' Provide tilt   Goals for Seating system '[x]'  Optimize pressure distribution '[x]'  Provide support needed to facilitate function or safety '[x]'  Provide corrective forces to assist with maintaining or improving posture '[]'  Accommodate client's posture:   current seated postures and positions are not flexible or will not tolerate corrective forces '[]'  Client to be independent with relieving pressure in the wheelchair '[]' Enhance physiological function such as breathing, swallowing, digestion  Simulation ideas/Equipment trials:????? State why other equipment was unsuccessful:?????   MOBILITY BASE RECOMMENDATIONS and JUSTIFICATION: MOBILITY COMPONENT JUSTIFICATION  Manufacturer: JazzyModel: Select 6   Size: Width 18"Seat Depth 20" '[x]' provide transport from point A to B      '[x]' promote Indep mobility  '[x]' is not a safe, functional ambulator '[x]' walker or cane inadequate '[]' non-standard width/depth necessary to accommodate anatomical measurement '[]'  ?????  '[]' Manual Mobility Base '[]' non-functional ambulator    '[]' Scooter/POV  '[]' can  safely operate  '[]' can safely transfer   '[]' has adequate trunk stability  '[]' cannot functionally propel manual w/c  '[x]' Power Mobility Base  '[]'   non-ambulatory  '[x]' cannot functionally propel manual wheelchair  '[x]'  cannot functionally and safely operate scooter/POV '[x]' can safely operate and willing to  '[]' Stroller Base '[]' infant/child  '[]' unable to propel manual wheelchair '[]' allows for growth '[]' non-functional ambulator '[]' non-functional UE '[]' Indep mobility is not a goal at this time  '[]' Tilt  '[]' Forward '[]' Backward '[]' Powered tilt  '[]' Manual tilt  '[]' change position against gravitational force on head and shoulders  '[]' change position for pressure relief/cannot weight shift '[]' transfers  '[]' management of tone '[]' rest periods '[]' control edema '[]' facilitate postural control  '[]'  ?????  '[]' Recline  '[]' Power recline on power base '[]' Manual recline on manual base  '[]' accommodate femur to back angle  '[]' bring to full recline for ADL care  '[]' change position for pressure relief/cannot weight shift '[]' rest periods '[]' repositioning for transfers or clothing/diaper /catheter changes '[]' head positioning  '[]' Lighter weight required '[]' self- propulsion  '[]' lifting '[]'  ?????  '[]' Heavy Duty required '[]' user weight greater than 250# '[]' extreme tone/ over active movement '[]' broken frame on previous chair '[]'  ?????  '[x]'  Back  '[]'  Angle Adjustable '[]'  Custom molded Captain's Seat '[]' postural control '[]' control of tone/spasticity '[]' accommodation of range of motion '[]' UE functional control '[]' accommodation for seating system '[]'  ????? '[]' provide lateral trunk support '[]' accommodate deformity '[x]' provide posterior trunk support '[]' provide lumbar/sacral support '[]' support trunk in midline '[]' Pressure relief over spinal processes  '[x]'  Seat Cushion Captain's Seat '[]' impaired sensation  '[]' decubitus ulcers present '[]' history of pressure ulceration '[]' prevent pelvic extension '[x]' low maintenance  '[]' stabilize pelvis  '[]' accommodate  obliquity '[]' accommodate multiple deformity '[]' neutralize lower extremity position '[]' increase pressure distribution '[]'  ?????  '[]'  Pelvic/thigh support  '[]'  Lateral thigh guide '[]'  Distal medial pad  '[]'  Distal lateral pad '[]'  pelvis in neutral '[]' accommodate pelvis '[]'  position upper legs '[]'  alignment '[]'  accommodate ROM '[]'  decr adduction '[]' accommodate tone '[]' removable for transfers '[]' decr abduction  '[]'  Lateral trunk Supports '[]'  Lt     '[]'  Rt '[]' decrease lateral trunk leaning '[]' control tone '[]' contour for increased contact '[]' safety  '[]' accommodate asymmetry '[]'  ?????  '[]'  Mounting hardware  '[]' lateral trunk supports  '[]' back   '[]' seat '[]' headrest      '[]'  thigh support '[]' fixed   '[]' swing away '[]' attach seat platform/cushion to w/c frame '[]' attach back cushion to w/c frame '[]' mount postural supports '[]' mount headrest  '[]' swing medial thigh support away '[]' swing lateral supports away for transfers  '[]'  ?????    Armrests  '[]' fixed '[x]' adjustable height '[]' removable   '[]' swing away  '[x]' flip back   '[]' reclining '[x]' full length pads '[]' desk    '[]' pads tubular  '[x]' provide support with elbow at 90   '[]' provide support for w/c tray '[]' change of height/angles for variable activities '[x]' remove for transfers '[x]' allow to come closer to table top '[]' remove for access to tables '[]'  ?????  Hangers/ Leg rests  '[]' 60 '[]' 70 '[]' 90 '[]' elevating '[]' heavy duty  '[]' articulating '[]' fixed '[]' lift off '[]' swing away     '[]' power '[]' provide LE support  '[]' accommodate to hamstring tightness '[]' elevate legs during recline   '[]' provide change in position for Legs '[]' Maintain placement of feet on footplate '[]' durability '[]' enable transfers '[]' decrease edema '[]' Accommodate lower leg length '[]'  ?????  Foot support Footplate    '[]' Lt  '[]'  Rt  '[x]'  Center mount '[x]' flip up     '[]' depth/angle adjustable '[]' Amputee adapter    '[]'  Lt     '[]'  Rt '[x]' provide foot support '[]' accommodate to ankle ROM '[x]' transfers '[]' Provide support for residual extremity '[]'  allow foot to go  under wheelchair base '[]'  decrease tone  '[]'  ?????  '[]'  Ankle strap/heel loops '[]' support foot on foot support '[]' decrease extraneous movement '[]' provide input to heel  '[]' protect foot  Tires: '[]' pneumatic  '[x]' flat free inserts  '[]' solid  '[x]' decrease maintenance  '[x]' prevent frequent flats '[]' increase shock absorbency '[]' decrease pain from road shock '[]' decrease spasms from road shock '[]'  ?????  '[]'  Headrest  '[]' provide posterior head support '[]' provide posterior neck support '[]' provide lateral head support '[]' provide anterior head support '[]' support during tilt and recline '[]' improve feeding   '[]' improve respiration '[]' placement of switches '[]' safety  '[]' accommodate ROM  '[]' accommodate tone '[]' improve visual orientation  '[]'  Anterior chest strap '[]'  Vest '[]'  Shoulder retractors  '[]' decrease forward movement of shoulder '[]' accommodation of TLSO '[]' decrease forward movement of trunk '[]' decrease shoulder elevation '[]' added abdominal support '[]' alignment '[]' assistance with shoulder control  '[]'  ?????  Pelvic Positioner '[x]' Belt '[]' SubASIS bar '[]' Dual Pull '[]' stabilize tone '[x]' decrease falling out of chair/ **will not Decr potential for sliding due to pelvic tilting '[]' prevent excessive rotation '[]' pad for protection over boney prominence '[]' prominence comfort '[]' special pull angle to control rotation '[]'  ?????  Upper Extremity Support '[]' L   '[]'  R '[]' Arm trough    '[]' hand support '[]'  tray       '[]' full tray '[]' swivel mount '[]' decrease edema      '[]' decrease subluxation   '[]' control tone   '[]' placement for AAC/Computer/EADL '[]' decrease gravitational pull on shoulders '[]' provide midline positioning '[]' provide support to increase UE function '[]' provide hand support in natural position '[]' provide work surface   POWER WHEELCHAIR CONTROLS  '[x]' Proportional  '[]' Non-Proportional Type Joystick '[x]' Left  '[]' Right '[x]' provides access for controlling wheelchair   '[]' lacks motor control to operate proportional drive control '[]' unable to  understand proportional controls  Actuator Control Module  '[]' Single  '[]' Multiple   '[]' Allow the client to operate the power seat function(s) through the joystick control   '[]' Safety Reset Switches '[]' Used to change modes and stop the wheelchair when driving in latch mode    '[]' Upgraded Electronics   '[]' programming for accurate control '[]' progressive Disease/changing condition '[]' non-proportional drive control needed '[]' Needed in order to operate power seat functions through joystick control   '[]' Display box '[]' Allows user to see in which mode and drive the wheelchair is set  '[]' necessary for alternate controls    '[]' Digital interface electronics '[]' Allows w/c to operate when using alternative drive controls  '[]' ASL Head Array '[]' Allows client to operate wheelchair  through switches placed in tri-panel headrest  '[]' Sip and puff with tubing kit '[]' needed to operate sip and puff drive controls  '[]' Upgraded tracking electronics '[]' increase safety when driving '[]' correct tracking when on uneven surfaces  '[]' Mount for switches or joystick '[]' Attaches switches to w/c  '[]' Swing away for access or transfers '[]' midline for optimal placement '[]' provides for consistent access  '[]' Attendant controlled joystick plus mount '[]' safety '[]' long distance driving '[]' operation of seat functions '[]' compliance with transportation regulations '[]'  ?????    Rear wheel placement/Axle adjustability '[]' None '[]' semi adjustable '[]' fully adjustable  '[]' improved UE access to wheels '[]' improved stability '[]' changing angle in space for improvement of postural stability '[]' 1-arm drive access '[]' amputee pad placement '[]'  ?????  Wheel rims/ hand rims  '[]' metal  '[]' plastic coated '[]' oblique projections '[]' vertical projections '[]' Provide ability to propel manual wheelchair  '[]'  Increase self-propulsion with hand weakness/decreased grasp  Push handles '[]' extended  '[]' angle adjustable  '[]' standard '[]' caregiver access '[]' caregiver assist '[]' allows "hooking" to enable  increased ability to perform ADLs or maintain balance  One armed device  '[]' Lt   '[]' Rt '[]' enable propulsion of manual wheelchair with one arm   '[]'  ?????   Brake/wheel lock extension '[]'  Lt   '[]'  Rt '[]' increase indep in applying wheel locks   '[]' Side guards '[]' prevent clothing getting caught in wheel or becoming soiled '[]'   prevent skin tears/abrasions  Battery: U1 x 2 '[x]' to power wheelchair ?????  Other: ????? ????? ?????  The above equipment has a life- long use expectancy. Growth and changes in medical and/or functional conditions would be the exceptions. This is to certify that the therapist has no financial relationship with durable medical provider or manufacturer. The therapist will not receive remuneration of any kind for the equipment recommended in this evaluation.   Patient has mobility limitation that significantly impairs safe, timely participation in one or more mobility related ADL's.  (bathing, toileting, feeding, dressing, grooming, moving from room to room)                                                             '[x]'  Yes '[]'  No Will mobility device sufficiently improve ability to participate and/or be aided in participation of MRADL's?         '[x]'  Yes '[]'  No Can limitation be compensated for with use of a cane or walker?                                                                                '[]'  Yes '[x]'  No Does patient or caregiver demonstrate ability/potential ability & willingness to safely use the mobility device?   '[x]'  Yes '[]'  No Does patient's home environment support use of recommended mobility device?                                                    '[x]'  Yes '[]'  No Does patient have sufficient upper extremity function necessary to functionally propel a manual wheelchair?    '[]'  Yes '[x]'  No Does patient have sufficient strength and trunk stability to safely operate a POV (scooter)?                                  '[]'  Yes '[x]'  No Does patient need additional features/benefits provided  by a power wheelchair for MRADL's in the home?       '[x]'  Yes '[]'  No Does the patient demonstrate the ability to safely use a power wheelchair?                                                              '[x]'  Yes '[]'  No  Therapist Name Printed: ????? Date: ?????  Therapist's Signature:   Date:   Supplier's Name Printed: ????? Date: ?????  Supplier's Signature:   Date:  Patient/Caregiver Signature:   Date:     This is to certify that I have read this evaluation and do agree with  the content within:    Physician's Name Printed: ?????  Physician's Signature:  Date:     This is to certify that I, the above signed therapist have the following affiliations: '[]'  This DME provider '[]'  Manufacturer of recommended equipment '[]'  Patient's long term care facility '[x]'  None of the above                                 Patient will benefit from skilled therapeutic intervention in order to improve the following deficits and impairments:     Visit Diagnosis: Other abnormalities of gait and mobility     Problem List Patient Active Problem List   Diagnosis Date Noted  . Pruritic rash 08/04/2015  . Chronic knee pain 08/04/2015  . 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 Spondylosis of lumbar region without myelopathy or radiculopathy 02/18/2014  . Adjustment disorder with mixed anxiety and depressed mood 09/09/2013  . Thrombocytopenia, unspecified ([place]) 09/09/2013  . ICH (intracerebral hemorrhage) ([place]) 08/26/2013  . Cytotoxic cerebral edema ([place]) 08/20/2013  . Brain herniation ([place]) 08/20/2013  . Cerebellar hemorrhage, nontraumatic ([facility]) 08/18/2013  . Chronic hepatitis C without hepatic coma ([place]) 08/03/2013  . Arthritis 05/03/2013    Geoff Dacanay W. 11/03/2015, 3:44 PM  Frazier Butt., PT   [facility] [address] [place], [place], [address] Phone: [phone]   Fax:   [phone]  Name: [name] MRN: [id number] Date of Birth: 03-31-1956

## 2015-11-10 ENCOUNTER — Other Ambulatory Visit: Payer: Self-pay | Admitting: Family Medicine

## 2015-11-15 ENCOUNTER — Other Ambulatory Visit: Payer: Self-pay | Admitting: Family Medicine

## 2015-11-16 ENCOUNTER — Ambulatory Visit: Payer: Medicaid Other | Admitting: Family Medicine

## 2015-11-23 ENCOUNTER — Other Ambulatory Visit: Payer: Self-pay | Admitting: Internal Medicine

## 2015-11-23 DIAGNOSIS — F10129 Alcohol abuse with intoxication, unspecified: Secondary | ICD-10-CM

## 2015-11-23 DIAGNOSIS — G588 Other specified mononeuropathies: Secondary | ICD-10-CM

## 2015-12-05 ENCOUNTER — Ambulatory Visit: Payer: Medicaid Other | Attending: Family Medicine | Admitting: Family Medicine

## 2015-12-05 ENCOUNTER — Encounter: Payer: Self-pay | Admitting: Family Medicine

## 2015-12-05 VITALS — BP 133/80 | HR 61 | Temp 97.7°F | Ht 66.0 in | Wt 197.2 lb

## 2015-12-05 DIAGNOSIS — I69193 Ataxia following nontraumatic intracerebral hemorrhage: Secondary | ICD-10-CM | POA: Diagnosis not present

## 2015-12-05 DIAGNOSIS — M25561 Pain in right knee: Secondary | ICD-10-CM | POA: Insufficient documentation

## 2015-12-05 DIAGNOSIS — G8929 Other chronic pain: Secondary | ICD-10-CM

## 2015-12-05 DIAGNOSIS — I69123 Fluency disorder following nontraumatic intracerebral hemorrhage: Secondary | ICD-10-CM | POA: Diagnosis not present

## 2015-12-05 DIAGNOSIS — F17210 Nicotine dependence, cigarettes, uncomplicated: Secondary | ICD-10-CM | POA: Diagnosis not present

## 2015-12-05 MED ORDER — DICLOFENAC SODIUM 1 % TD GEL: 2.0000 g | Freq: Four times a day (QID) | TRANSDERMAL | 0 refills | Status: AC

## 2015-12-05 MED ORDER — ACETAMINOPHEN-CODEINE #3 300-30 MG PO TABS: 1.0000 | ORAL_TABLET | ORAL | 2 refills | Status: AC | PRN

## 2015-12-05 NOTE — Progress Notes (Signed)
C/C: f/u on knee pain, pt also having right hip pain. Pt needs refills. Pt refused flu shot. Pt was given 5 bottles of ensure today.

## 2015-12-05 NOTE — Patient Instructions (Addendum)
[name] was seen today for knee pain.  Diagnoses and all orders for this visit:  Chronic knee pain, right -     acetaminophen-codeine (TYLENOL #3) 300-30 MG tablet; Take 1 tablet by mouth every 4 (four) hours as needed for moderate pain. -     diclofenac sodium (VOLTAREN) 1 % GEL; Apply 2 g topically 4 (four) times daily.    F/u in 6-8 weeks for R knee steroid injection  Dr. Adrian Blackwater

## 2015-12-05 NOTE — Assessment & Plan Note (Signed)
PT assessment done Patient does qualify for mechanical wheelchair  Plan: Printed and signed PT assessment Will fax assessment and mechanical wheelchair order to [facility]
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
## 2015-12-12 ENCOUNTER — Other Ambulatory Visit: Payer: Self-pay | Admitting: Family Medicine

## 2015-12-13 ENCOUNTER — Other Ambulatory Visit: Payer: Self-pay | Admitting: Family Medicine

## 2015-12-20 ENCOUNTER — Other Ambulatory Visit: Payer: Self-pay | Admitting: Internal Medicine

## 2015-12-20 DIAGNOSIS — G588 Other specified mononeuropathies: Secondary | ICD-10-CM

## 2015-12-20 DIAGNOSIS — F10129 Alcohol abuse with intoxication, unspecified: Secondary | ICD-10-CM

## 2015-12-21 ENCOUNTER — Telehealth: Payer: Self-pay | Admitting: Pharmacy Technician

## 2015-12-21 NOTE — Telephone Encounter (Signed)
[name] was denied Hep C medication through Medicaid in 2016 due to elastography showing F0.  I tried to resch for a follow up fibrosure to see if she can get approved through Medicaid, but while explaining, she hung up on me.

## 2015-12-25 ENCOUNTER — Other Ambulatory Visit: Payer: Self-pay | Admitting: Physician Assistant

## 2015-12-29 ENCOUNTER — Other Ambulatory Visit: Payer: Self-pay | Admitting: Physician Assistant

## 2016-01-16 ENCOUNTER — Other Ambulatory Visit: Payer: Self-pay | Admitting: Internal Medicine

## 2016-01-16 DIAGNOSIS — G588 Other specified mononeuropathies: Secondary | ICD-10-CM

## 2016-01-16 DIAGNOSIS — F10129 Alcohol abuse with intoxication, unspecified: Secondary | ICD-10-CM

## 2016-01-17 ENCOUNTER — Other Ambulatory Visit: Payer: Self-pay | Admitting: Internal Medicine

## 2016-01-17 DIAGNOSIS — F10129 Alcohol abuse with intoxication, unspecified: Secondary | ICD-10-CM

## 2016-01-17 DIAGNOSIS — G588 Other specified mononeuropathies: Secondary | ICD-10-CM

## 2016-01-18 ENCOUNTER — Other Ambulatory Visit: Payer: Self-pay | Admitting: Internal Medicine

## 2016-01-18 DIAGNOSIS — F10129 Alcohol abuse with intoxication, unspecified: Secondary | ICD-10-CM

## 2016-01-18 DIAGNOSIS — G588 Other specified mononeuropathies: Secondary | ICD-10-CM

## 2016-01-19 ENCOUNTER — Other Ambulatory Visit: Payer: Self-pay | Admitting: Internal Medicine

## 2016-01-19 DIAGNOSIS — G588 Other specified mononeuropathies: Secondary | ICD-10-CM

## 2016-01-19 DIAGNOSIS — F10129 Alcohol abuse with intoxication, unspecified: Secondary | ICD-10-CM

## 2016-02-02 ENCOUNTER — Telehealth: Payer: Self-pay | Admitting: Pharmacy Technician

## 2016-02-02 NOTE — Telephone Encounter (Signed)
She moved to [place].  Will get Hep C med there.

## 2016-03-08 NOTE — Therapy (Deleted)
[facility], [place], [address] Phone: [phone]   Fax:  [phone]  Patient Details  Name: [name] MRN: [id number] Date of Birth: 10-02-1955 Referring Provider:  Boykin Nearing, MD  Encounter Date: 08/30/2015  PHYSICAL THERAPY DISCHARGE SUMMARY  Visits from Start of Care: 1 eval and 1 w/c assessment  Current functional level related to goals / functional outcomes: Unknown, as pt did not return after initial visit. Pt did return 8 months later for a w/c eval.   Remaining deficits: Unknown.   Education / Equipment: PT frequency/duration  Plan: Patient agrees to discharge.  Patient goals were not met. Patient is being discharged due to not returning since the last visit.  ?????       Sally Menard L 03/08/2016, 10:04 AM  Geoffry Paradise, PT,DPT [place], [place]  03/08/16 10:04 AM Phone: [phone] Fax: [phone]

## 2016-03-08 NOTE — Therapy (Signed)
[facility] [address] [place], [place], [address] Phone: [phone]   Fax:  [phone]  Patient Details  Name: [name] MRN: [id number] Date of Birth: 1955/10/06 Referring Provider:  No ref. provider found  Encounter Date: 03/08/2016  PHYSICAL THERAPY DISCHARGE SUMMARY  Visits from Start of Care: 1 eval and 1 w/c assessment  Current functional level related to goals / functional outcomes: Unknown, as pt never returned after initial eval, but did have w/c assessment 8 months after initial eval.   Remaining deficits: Unknown   Education / Equipment: PT frequency/duraiton  Plan: Patient agrees to discharge.  Patient goals were not met. Patient is being discharged due to not returning since the last visit.  ?????       Margery Szostak L 03/08/2016, 10:08 AM  [facility] [address] [place], [place], [address] Phone: [phone]   Fax:  [phone]   .Geoffry Paradise, PT,DPT 03/08/16 10:08 AM Phone: [phone] Fax: [phone]

## 2016-05-14 NOTE — Telephone Encounter (Signed)
[name] hung up on me when I tried to resch for a follow up fibrosure to see if can get approved through [facility]
